# Patient Record
Sex: Female | Born: 1945
Health system: Southern US, Community
[De-identification: ages and names within clinical notes are randomized; demographics above are authoritative.]

## PROBLEM LIST (undated history)

## (undated) DIAGNOSIS — K219 Gastro-esophageal reflux disease without esophagitis: Secondary | ICD-10-CM

## (undated) DIAGNOSIS — E0590 Thyrotoxicosis, unspecified without thyrotoxic crisis or storm: Secondary | ICD-10-CM

## (undated) DIAGNOSIS — I10 Essential (primary) hypertension: Secondary | ICD-10-CM

## (undated) DIAGNOSIS — E119 Type 2 diabetes mellitus without complications: Secondary | ICD-10-CM

## (undated) DIAGNOSIS — M1990 Unspecified osteoarthritis, unspecified site: Secondary | ICD-10-CM

## (undated) DIAGNOSIS — E039 Hypothyroidism, unspecified: Secondary | ICD-10-CM

## (undated) HISTORY — DX: Thyrotoxicosis, unspecified without thyrotoxic crisis or storm: E05.90

## (undated) HISTORY — DX: Essential (primary) hypertension: I10

## (undated) HISTORY — DX: Gastro-esophageal reflux disease without esophagitis: K21.9

## (undated) HISTORY — DX: Hypothyroidism, unspecified: E03.9

## (undated) HISTORY — PX: CATARACT EXTRACTION: SUR2

## (undated) HISTORY — DX: Unspecified osteoarthritis, unspecified site: M19.90

## (undated) HISTORY — DX: Type 2 diabetes mellitus without complications: E11.9

## (undated) HISTORY — PX: ABDOMINAL HYSTERECTOMY: SHX81

---

## 2015-11-11 DIAGNOSIS — Z1211 Encounter for screening for malignant neoplasm of colon: Secondary | ICD-10-CM | POA: Diagnosis not present

## 2015-11-11 DIAGNOSIS — E7801 Familial hypercholesterolemia: Secondary | ICD-10-CM | POA: Diagnosis not present

## 2015-11-11 DIAGNOSIS — E559 Vitamin D deficiency, unspecified: Secondary | ICD-10-CM | POA: Diagnosis not present

## 2015-11-11 DIAGNOSIS — Z1231 Encounter for screening mammogram for malignant neoplasm of breast: Secondary | ICD-10-CM | POA: Diagnosis not present

## 2015-11-11 DIAGNOSIS — I10 Essential (primary) hypertension: Secondary | ICD-10-CM | POA: Diagnosis not present

## 2015-11-11 DIAGNOSIS — K219 Gastro-esophageal reflux disease without esophagitis: Secondary | ICD-10-CM | POA: Diagnosis not present

## 2015-11-11 DIAGNOSIS — G629 Polyneuropathy, unspecified: Secondary | ICD-10-CM | POA: Diagnosis not present

## 2015-11-11 DIAGNOSIS — J309 Allergic rhinitis, unspecified: Secondary | ICD-10-CM | POA: Diagnosis not present

## 2015-11-11 DIAGNOSIS — E118 Type 2 diabetes mellitus with unspecified complications: Secondary | ICD-10-CM | POA: Diagnosis not present

## 2015-11-11 DIAGNOSIS — E0590 Thyrotoxicosis, unspecified without thyrotoxic crisis or storm: Secondary | ICD-10-CM | POA: Diagnosis not present

## 2015-11-11 DIAGNOSIS — Z0000 Encounter for general adult medical examination without abnormal findings: Secondary | ICD-10-CM | POA: Diagnosis not present

## 2015-11-11 DIAGNOSIS — Z124 Encounter for screening for malignant neoplasm of cervix: Secondary | ICD-10-CM | POA: Diagnosis not present

## 2015-11-11 DIAGNOSIS — M1990 Unspecified osteoarthritis, unspecified site: Secondary | ICD-10-CM | POA: Diagnosis not present

## 2015-11-22 DIAGNOSIS — Z1231 Encounter for screening mammogram for malignant neoplasm of breast: Secondary | ICD-10-CM | POA: Diagnosis not present

## 2015-12-04 DIAGNOSIS — K219 Gastro-esophageal reflux disease without esophagitis: Secondary | ICD-10-CM | POA: Diagnosis not present

## 2016-01-20 DIAGNOSIS — H40023 Open angle with borderline findings, high risk, bilateral: Secondary | ICD-10-CM | POA: Diagnosis not present

## 2016-03-10 DIAGNOSIS — E1165 Type 2 diabetes mellitus with hyperglycemia: Secondary | ICD-10-CM | POA: Diagnosis not present

## 2016-07-13 DIAGNOSIS — E1165 Type 2 diabetes mellitus with hyperglycemia: Secondary | ICD-10-CM | POA: Diagnosis not present

## 2016-07-13 DIAGNOSIS — M7582 Other shoulder lesions, left shoulder: Secondary | ICD-10-CM | POA: Diagnosis not present

## 2016-07-20 DIAGNOSIS — M25512 Pain in left shoulder: Secondary | ICD-10-CM | POA: Diagnosis not present

## 2016-07-20 DIAGNOSIS — M7592 Shoulder lesion, unspecified, left shoulder: Secondary | ICD-10-CM | POA: Diagnosis not present

## 2016-07-22 DIAGNOSIS — M25512 Pain in left shoulder: Secondary | ICD-10-CM | POA: Diagnosis not present

## 2016-07-22 DIAGNOSIS — M7592 Shoulder lesion, unspecified, left shoulder: Secondary | ICD-10-CM | POA: Diagnosis not present

## 2016-07-24 DIAGNOSIS — H40013 Open angle with borderline findings, low risk, bilateral: Secondary | ICD-10-CM | POA: Diagnosis not present

## 2016-07-24 DIAGNOSIS — E119 Type 2 diabetes mellitus without complications: Secondary | ICD-10-CM | POA: Diagnosis not present

## 2016-07-24 DIAGNOSIS — H52223 Regular astigmatism, bilateral: Secondary | ICD-10-CM | POA: Diagnosis not present

## 2016-07-24 DIAGNOSIS — H524 Presbyopia: Secondary | ICD-10-CM | POA: Diagnosis not present

## 2016-07-24 DIAGNOSIS — H5203 Hypermetropia, bilateral: Secondary | ICD-10-CM | POA: Diagnosis not present

## 2016-07-27 DIAGNOSIS — M7592 Shoulder lesion, unspecified, left shoulder: Secondary | ICD-10-CM | POA: Diagnosis not present

## 2016-07-27 DIAGNOSIS — M25512 Pain in left shoulder: Secondary | ICD-10-CM | POA: Diagnosis not present

## 2016-07-29 DIAGNOSIS — M7592 Shoulder lesion, unspecified, left shoulder: Secondary | ICD-10-CM | POA: Diagnosis not present

## 2016-07-29 DIAGNOSIS — M25512 Pain in left shoulder: Secondary | ICD-10-CM | POA: Diagnosis not present

## 2016-08-03 DIAGNOSIS — M25512 Pain in left shoulder: Secondary | ICD-10-CM | POA: Diagnosis not present

## 2016-08-03 DIAGNOSIS — M7592 Shoulder lesion, unspecified, left shoulder: Secondary | ICD-10-CM | POA: Diagnosis not present

## 2016-08-05 DIAGNOSIS — M25512 Pain in left shoulder: Secondary | ICD-10-CM | POA: Diagnosis not present

## 2016-08-05 DIAGNOSIS — M7592 Shoulder lesion, unspecified, left shoulder: Secondary | ICD-10-CM | POA: Diagnosis not present

## 2016-08-25 DIAGNOSIS — R3989 Other symptoms and signs involving the genitourinary system: Secondary | ICD-10-CM | POA: Diagnosis not present

## 2016-11-11 DIAGNOSIS — Z124 Encounter for screening for malignant neoplasm of cervix: Secondary | ICD-10-CM | POA: Diagnosis not present

## 2016-11-11 DIAGNOSIS — Z0000 Encounter for general adult medical examination without abnormal findings: Secondary | ICD-10-CM | POA: Diagnosis not present

## 2016-11-11 DIAGNOSIS — Z1231 Encounter for screening mammogram for malignant neoplasm of breast: Secondary | ICD-10-CM | POA: Diagnosis not present

## 2016-11-11 DIAGNOSIS — E7801 Familial hypercholesterolemia: Secondary | ICD-10-CM | POA: Diagnosis not present

## 2016-11-11 DIAGNOSIS — M1990 Unspecified osteoarthritis, unspecified site: Secondary | ICD-10-CM | POA: Diagnosis not present

## 2016-11-11 DIAGNOSIS — E0590 Thyrotoxicosis, unspecified without thyrotoxic crisis or storm: Secondary | ICD-10-CM | POA: Diagnosis not present

## 2016-11-11 DIAGNOSIS — Z6826 Body mass index (BMI) 26.0-26.9, adult: Secondary | ICD-10-CM | POA: Diagnosis not present

## 2016-11-11 DIAGNOSIS — E559 Vitamin D deficiency, unspecified: Secondary | ICD-10-CM | POA: Diagnosis not present

## 2016-11-11 DIAGNOSIS — E1165 Type 2 diabetes mellitus with hyperglycemia: Secondary | ICD-10-CM | POA: Diagnosis not present

## 2016-11-11 DIAGNOSIS — J309 Allergic rhinitis, unspecified: Secondary | ICD-10-CM | POA: Diagnosis not present

## 2016-11-11 DIAGNOSIS — I10 Essential (primary) hypertension: Secondary | ICD-10-CM | POA: Diagnosis not present

## 2016-11-11 DIAGNOSIS — Z1211 Encounter for screening for malignant neoplasm of colon: Secondary | ICD-10-CM | POA: Diagnosis not present

## 2016-11-23 DIAGNOSIS — Z1231 Encounter for screening mammogram for malignant neoplasm of breast: Secondary | ICD-10-CM | POA: Diagnosis not present

## 2016-11-24 DIAGNOSIS — M75100 Unspecified rotator cuff tear or rupture of unspecified shoulder, not specified as traumatic: Secondary | ICD-10-CM | POA: Diagnosis not present

## 2016-11-25 DIAGNOSIS — M79671 Pain in right foot: Secondary | ICD-10-CM | POA: Diagnosis not present

## 2016-11-25 DIAGNOSIS — M79672 Pain in left foot: Secondary | ICD-10-CM | POA: Diagnosis not present

## 2016-11-25 DIAGNOSIS — E1140 Type 2 diabetes mellitus with diabetic neuropathy, unspecified: Secondary | ICD-10-CM | POA: Diagnosis not present

## 2016-12-14 DIAGNOSIS — D649 Anemia, unspecified: Secondary | ICD-10-CM | POA: Diagnosis not present

## 2016-12-25 DIAGNOSIS — M79671 Pain in right foot: Secondary | ICD-10-CM | POA: Diagnosis not present

## 2016-12-25 DIAGNOSIS — E1140 Type 2 diabetes mellitus with diabetic neuropathy, unspecified: Secondary | ICD-10-CM | POA: Diagnosis not present

## 2016-12-25 DIAGNOSIS — M79672 Pain in left foot: Secondary | ICD-10-CM | POA: Diagnosis not present

## 2017-01-20 DIAGNOSIS — M79671 Pain in right foot: Secondary | ICD-10-CM | POA: Diagnosis not present

## 2017-01-20 DIAGNOSIS — E1140 Type 2 diabetes mellitus with diabetic neuropathy, unspecified: Secondary | ICD-10-CM | POA: Diagnosis not present

## 2017-01-20 DIAGNOSIS — M79672 Pain in left foot: Secondary | ICD-10-CM | POA: Diagnosis not present

## 2017-02-17 DIAGNOSIS — M79672 Pain in left foot: Secondary | ICD-10-CM | POA: Diagnosis not present

## 2017-02-17 DIAGNOSIS — M79671 Pain in right foot: Secondary | ICD-10-CM | POA: Diagnosis not present

## 2017-02-17 DIAGNOSIS — E1140 Type 2 diabetes mellitus with diabetic neuropathy, unspecified: Secondary | ICD-10-CM | POA: Diagnosis not present

## 2017-03-17 DIAGNOSIS — G600 Hereditary motor and sensory neuropathy: Secondary | ICD-10-CM | POA: Diagnosis not present

## 2017-03-17 DIAGNOSIS — E1140 Type 2 diabetes mellitus with diabetic neuropathy, unspecified: Secondary | ICD-10-CM | POA: Diagnosis not present

## 2017-03-17 DIAGNOSIS — G5790 Unspecified mononeuropathy of unspecified lower limb: Secondary | ICD-10-CM | POA: Diagnosis not present

## 2017-03-17 DIAGNOSIS — M79671 Pain in right foot: Secondary | ICD-10-CM | POA: Diagnosis not present

## 2017-04-26 DIAGNOSIS — R5383 Other fatigue: Secondary | ICD-10-CM | POA: Diagnosis not present

## 2017-04-26 DIAGNOSIS — Z299 Encounter for prophylactic measures, unspecified: Secondary | ICD-10-CM | POA: Diagnosis not present

## 2017-04-26 DIAGNOSIS — E0590 Thyrotoxicosis, unspecified without thyrotoxic crisis or storm: Secondary | ICD-10-CM | POA: Diagnosis not present

## 2017-04-26 DIAGNOSIS — Z6825 Body mass index (BMI) 25.0-25.9, adult: Secondary | ICD-10-CM | POA: Diagnosis not present

## 2017-04-26 DIAGNOSIS — I10 Essential (primary) hypertension: Secondary | ICD-10-CM | POA: Diagnosis not present

## 2017-04-26 DIAGNOSIS — K219 Gastro-esophageal reflux disease without esophagitis: Secondary | ICD-10-CM | POA: Diagnosis not present

## 2017-04-26 DIAGNOSIS — M1990 Unspecified osteoarthritis, unspecified site: Secondary | ICD-10-CM | POA: Diagnosis not present

## 2017-04-26 DIAGNOSIS — Z713 Dietary counseling and surveillance: Secondary | ICD-10-CM | POA: Diagnosis not present

## 2017-04-26 DIAGNOSIS — D649 Anemia, unspecified: Secondary | ICD-10-CM | POA: Diagnosis not present

## 2017-04-26 DIAGNOSIS — E119 Type 2 diabetes mellitus without complications: Secondary | ICD-10-CM | POA: Diagnosis not present

## 2017-05-19 DIAGNOSIS — I10 Essential (primary) hypertension: Secondary | ICD-10-CM | POA: Diagnosis not present

## 2017-05-19 DIAGNOSIS — Z6824 Body mass index (BMI) 24.0-24.9, adult: Secondary | ICD-10-CM | POA: Diagnosis not present

## 2017-05-19 DIAGNOSIS — Z299 Encounter for prophylactic measures, unspecified: Secondary | ICD-10-CM | POA: Diagnosis not present

## 2017-05-19 DIAGNOSIS — E119 Type 2 diabetes mellitus without complications: Secondary | ICD-10-CM | POA: Diagnosis not present

## 2017-05-19 DIAGNOSIS — E0590 Thyrotoxicosis, unspecified without thyrotoxic crisis or storm: Secondary | ICD-10-CM | POA: Diagnosis not present

## 2017-05-19 DIAGNOSIS — B029 Zoster without complications: Secondary | ICD-10-CM | POA: Diagnosis not present

## 2017-07-30 DIAGNOSIS — H524 Presbyopia: Secondary | ICD-10-CM | POA: Diagnosis not present

## 2017-07-30 DIAGNOSIS — E119 Type 2 diabetes mellitus without complications: Secondary | ICD-10-CM | POA: Diagnosis not present

## 2017-07-30 DIAGNOSIS — H5203 Hypermetropia, bilateral: Secondary | ICD-10-CM | POA: Diagnosis not present

## 2017-07-30 DIAGNOSIS — H40013 Open angle with borderline findings, low risk, bilateral: Secondary | ICD-10-CM | POA: Diagnosis not present

## 2017-07-30 DIAGNOSIS — H52223 Regular astigmatism, bilateral: Secondary | ICD-10-CM | POA: Diagnosis not present

## 2017-08-04 DIAGNOSIS — Z789 Other specified health status: Secondary | ICD-10-CM | POA: Diagnosis not present

## 2017-08-04 DIAGNOSIS — Z299 Encounter for prophylactic measures, unspecified: Secondary | ICD-10-CM | POA: Diagnosis not present

## 2017-08-04 DIAGNOSIS — E1165 Type 2 diabetes mellitus with hyperglycemia: Secondary | ICD-10-CM | POA: Diagnosis not present

## 2017-08-04 DIAGNOSIS — Z6824 Body mass index (BMI) 24.0-24.9, adult: Secondary | ICD-10-CM | POA: Diagnosis not present

## 2017-08-04 DIAGNOSIS — J309 Allergic rhinitis, unspecified: Secondary | ICD-10-CM | POA: Diagnosis not present

## 2017-08-04 DIAGNOSIS — I10 Essential (primary) hypertension: Secondary | ICD-10-CM | POA: Diagnosis not present

## 2017-08-04 DIAGNOSIS — M549 Dorsalgia, unspecified: Secondary | ICD-10-CM | POA: Diagnosis not present

## 2017-09-17 DIAGNOSIS — H40013 Open angle with borderline findings, low risk, bilateral: Secondary | ICD-10-CM | POA: Diagnosis not present

## 2017-11-10 DIAGNOSIS — R5383 Other fatigue: Secondary | ICD-10-CM | POA: Diagnosis not present

## 2017-11-10 DIAGNOSIS — Z1331 Encounter for screening for depression: Secondary | ICD-10-CM | POA: Diagnosis not present

## 2017-11-10 DIAGNOSIS — E559 Vitamin D deficiency, unspecified: Secondary | ICD-10-CM | POA: Diagnosis not present

## 2017-11-10 DIAGNOSIS — Z6826 Body mass index (BMI) 26.0-26.9, adult: Secondary | ICD-10-CM | POA: Diagnosis not present

## 2017-11-10 DIAGNOSIS — Z7189 Other specified counseling: Secondary | ICD-10-CM | POA: Diagnosis not present

## 2017-11-10 DIAGNOSIS — I10 Essential (primary) hypertension: Secondary | ICD-10-CM | POA: Diagnosis not present

## 2017-11-10 DIAGNOSIS — Z79899 Other long term (current) drug therapy: Secondary | ICD-10-CM | POA: Diagnosis not present

## 2017-11-10 DIAGNOSIS — Z1339 Encounter for screening examination for other mental health and behavioral disorders: Secondary | ICD-10-CM | POA: Diagnosis not present

## 2017-11-10 DIAGNOSIS — E538 Deficiency of other specified B group vitamins: Secondary | ICD-10-CM | POA: Diagnosis not present

## 2017-11-10 DIAGNOSIS — Z90710 Acquired absence of both cervix and uterus: Secondary | ICD-10-CM | POA: Diagnosis not present

## 2017-11-10 DIAGNOSIS — E8940 Asymptomatic postprocedural ovarian failure: Secondary | ICD-10-CM | POA: Diagnosis not present

## 2017-11-10 DIAGNOSIS — Z0000 Encounter for general adult medical examination without abnormal findings: Secondary | ICD-10-CM | POA: Diagnosis not present

## 2017-11-10 DIAGNOSIS — Z299 Encounter for prophylactic measures, unspecified: Secondary | ICD-10-CM | POA: Diagnosis not present

## 2017-11-10 DIAGNOSIS — E1165 Type 2 diabetes mellitus with hyperglycemia: Secondary | ICD-10-CM | POA: Diagnosis not present

## 2017-11-10 DIAGNOSIS — D509 Iron deficiency anemia, unspecified: Secondary | ICD-10-CM | POA: Diagnosis not present

## 2017-11-23 DIAGNOSIS — E2839 Other primary ovarian failure: Secondary | ICD-10-CM | POA: Diagnosis not present

## 2017-11-26 DIAGNOSIS — Z1231 Encounter for screening mammogram for malignant neoplasm of breast: Secondary | ICD-10-CM | POA: Diagnosis not present

## 2017-12-30 DIAGNOSIS — K5730 Diverticulosis of large intestine without perforation or abscess without bleeding: Secondary | ICD-10-CM | POA: Diagnosis not present

## 2017-12-30 DIAGNOSIS — E0590 Thyrotoxicosis, unspecified without thyrotoxic crisis or storm: Secondary | ICD-10-CM | POA: Diagnosis not present

## 2017-12-30 DIAGNOSIS — K644 Residual hemorrhoidal skin tags: Secondary | ICD-10-CM | POA: Diagnosis not present

## 2017-12-30 DIAGNOSIS — Z86010 Personal history of colonic polyps: Secondary | ICD-10-CM | POA: Diagnosis not present

## 2017-12-30 DIAGNOSIS — K219 Gastro-esophageal reflux disease without esophagitis: Secondary | ICD-10-CM | POA: Diagnosis not present

## 2017-12-30 DIAGNOSIS — K598 Other specified functional intestinal disorders: Secondary | ICD-10-CM | POA: Diagnosis not present

## 2017-12-30 DIAGNOSIS — Z1211 Encounter for screening for malignant neoplasm of colon: Secondary | ICD-10-CM | POA: Diagnosis not present

## 2017-12-30 DIAGNOSIS — Q438 Other specified congenital malformations of intestine: Secondary | ICD-10-CM | POA: Diagnosis not present

## 2017-12-30 DIAGNOSIS — I10 Essential (primary) hypertension: Secondary | ICD-10-CM | POA: Diagnosis not present

## 2017-12-30 DIAGNOSIS — K641 Second degree hemorrhoids: Secondary | ICD-10-CM | POA: Diagnosis not present

## 2017-12-30 DIAGNOSIS — E119 Type 2 diabetes mellitus without complications: Secondary | ICD-10-CM | POA: Diagnosis not present

## 2017-12-30 DIAGNOSIS — M1990 Unspecified osteoarthritis, unspecified site: Secondary | ICD-10-CM | POA: Diagnosis not present

## 2018-02-15 DIAGNOSIS — Z6826 Body mass index (BMI) 26.0-26.9, adult: Secondary | ICD-10-CM | POA: Diagnosis not present

## 2018-02-15 DIAGNOSIS — I10 Essential (primary) hypertension: Secondary | ICD-10-CM | POA: Diagnosis not present

## 2018-02-15 DIAGNOSIS — E1165 Type 2 diabetes mellitus with hyperglycemia: Secondary | ICD-10-CM | POA: Diagnosis not present

## 2018-02-15 DIAGNOSIS — B370 Candidal stomatitis: Secondary | ICD-10-CM | POA: Diagnosis not present

## 2018-02-15 DIAGNOSIS — Z299 Encounter for prophylactic measures, unspecified: Secondary | ICD-10-CM | POA: Diagnosis not present

## 2018-03-18 DIAGNOSIS — H40013 Open angle with borderline findings, low risk, bilateral: Secondary | ICD-10-CM | POA: Diagnosis not present

## 2018-05-31 DIAGNOSIS — I10 Essential (primary) hypertension: Secondary | ICD-10-CM | POA: Diagnosis not present

## 2018-05-31 DIAGNOSIS — E1165 Type 2 diabetes mellitus with hyperglycemia: Secondary | ICD-10-CM | POA: Diagnosis not present

## 2018-05-31 DIAGNOSIS — K219 Gastro-esophageal reflux disease without esophagitis: Secondary | ICD-10-CM | POA: Diagnosis not present

## 2018-05-31 DIAGNOSIS — M791 Myalgia, unspecified site: Secondary | ICD-10-CM | POA: Diagnosis not present

## 2018-05-31 DIAGNOSIS — B370 Candidal stomatitis: Secondary | ICD-10-CM | POA: Diagnosis not present

## 2018-05-31 DIAGNOSIS — Z789 Other specified health status: Secondary | ICD-10-CM | POA: Diagnosis not present

## 2018-05-31 DIAGNOSIS — E0590 Thyrotoxicosis, unspecified without thyrotoxic crisis or storm: Secondary | ICD-10-CM | POA: Diagnosis not present

## 2018-05-31 DIAGNOSIS — Z299 Encounter for prophylactic measures, unspecified: Secondary | ICD-10-CM | POA: Diagnosis not present

## 2018-05-31 DIAGNOSIS — Z6825 Body mass index (BMI) 25.0-25.9, adult: Secondary | ICD-10-CM | POA: Diagnosis not present

## 2018-06-02 DIAGNOSIS — E1140 Type 2 diabetes mellitus with diabetic neuropathy, unspecified: Secondary | ICD-10-CM | POA: Diagnosis not present

## 2018-06-02 DIAGNOSIS — E1151 Type 2 diabetes mellitus with diabetic peripheral angiopathy without gangrene: Secondary | ICD-10-CM | POA: Diagnosis not present

## 2018-06-02 DIAGNOSIS — M79671 Pain in right foot: Secondary | ICD-10-CM | POA: Diagnosis not present

## 2018-06-02 DIAGNOSIS — G600 Hereditary motor and sensory neuropathy: Secondary | ICD-10-CM | POA: Diagnosis not present

## 2018-06-13 DIAGNOSIS — Z299 Encounter for prophylactic measures, unspecified: Secondary | ICD-10-CM | POA: Diagnosis not present

## 2018-06-13 DIAGNOSIS — I10 Essential (primary) hypertension: Secondary | ICD-10-CM | POA: Diagnosis not present

## 2018-06-13 DIAGNOSIS — G629 Polyneuropathy, unspecified: Secondary | ICD-10-CM | POA: Diagnosis not present

## 2018-06-13 DIAGNOSIS — E0590 Thyrotoxicosis, unspecified without thyrotoxic crisis or storm: Secondary | ICD-10-CM | POA: Diagnosis not present

## 2018-06-13 DIAGNOSIS — Z6825 Body mass index (BMI) 25.0-25.9, adult: Secondary | ICD-10-CM | POA: Diagnosis not present

## 2018-06-27 ENCOUNTER — Encounter: Payer: Self-pay | Admitting: "Endocrinology

## 2018-06-27 DIAGNOSIS — E0590 Thyrotoxicosis, unspecified without thyrotoxic crisis or storm: Secondary | ICD-10-CM | POA: Diagnosis not present

## 2018-06-27 DIAGNOSIS — E042 Nontoxic multinodular goiter: Secondary | ICD-10-CM | POA: Diagnosis not present

## 2018-06-30 DIAGNOSIS — M79674 Pain in right toe(s): Secondary | ICD-10-CM | POA: Diagnosis not present

## 2018-06-30 DIAGNOSIS — E1140 Type 2 diabetes mellitus with diabetic neuropathy, unspecified: Secondary | ICD-10-CM | POA: Diagnosis not present

## 2018-06-30 DIAGNOSIS — M79671 Pain in right foot: Secondary | ICD-10-CM | POA: Diagnosis not present

## 2018-06-30 DIAGNOSIS — M79672 Pain in left foot: Secondary | ICD-10-CM | POA: Diagnosis not present

## 2018-07-14 DIAGNOSIS — Z2821 Immunization not carried out because of patient refusal: Secondary | ICD-10-CM | POA: Diagnosis not present

## 2018-07-14 DIAGNOSIS — E0590 Thyrotoxicosis, unspecified without thyrotoxic crisis or storm: Secondary | ICD-10-CM | POA: Diagnosis not present

## 2018-07-14 DIAGNOSIS — Z6826 Body mass index (BMI) 26.0-26.9, adult: Secondary | ICD-10-CM | POA: Diagnosis not present

## 2018-07-14 DIAGNOSIS — E1165 Type 2 diabetes mellitus with hyperglycemia: Secondary | ICD-10-CM | POA: Diagnosis not present

## 2018-07-14 DIAGNOSIS — I10 Essential (primary) hypertension: Secondary | ICD-10-CM | POA: Diagnosis not present

## 2018-07-14 DIAGNOSIS — Z299 Encounter for prophylactic measures, unspecified: Secondary | ICD-10-CM | POA: Diagnosis not present

## 2018-07-28 DIAGNOSIS — M25579 Pain in unspecified ankle and joints of unspecified foot: Secondary | ICD-10-CM | POA: Diagnosis not present

## 2018-07-28 DIAGNOSIS — M79672 Pain in left foot: Secondary | ICD-10-CM | POA: Diagnosis not present

## 2018-07-28 DIAGNOSIS — M79674 Pain in right toe(s): Secondary | ICD-10-CM | POA: Diagnosis not present

## 2018-07-28 DIAGNOSIS — M79671 Pain in right foot: Secondary | ICD-10-CM | POA: Diagnosis not present

## 2018-08-04 ENCOUNTER — Encounter: Payer: Self-pay | Admitting: "Endocrinology

## 2018-08-22 ENCOUNTER — Ambulatory Visit: Payer: Self-pay | Admitting: "Endocrinology

## 2018-08-24 DIAGNOSIS — H40013 Open angle with borderline findings, low risk, bilateral: Secondary | ICD-10-CM | POA: Diagnosis not present

## 2018-08-24 DIAGNOSIS — H524 Presbyopia: Secondary | ICD-10-CM | POA: Diagnosis not present

## 2018-08-24 DIAGNOSIS — H5203 Hypermetropia, bilateral: Secondary | ICD-10-CM | POA: Diagnosis not present

## 2018-08-24 DIAGNOSIS — E119 Type 2 diabetes mellitus without complications: Secondary | ICD-10-CM | POA: Diagnosis not present

## 2018-09-14 DIAGNOSIS — L509 Urticaria, unspecified: Secondary | ICD-10-CM | POA: Diagnosis not present

## 2018-09-14 DIAGNOSIS — M79605 Pain in left leg: Secondary | ICD-10-CM | POA: Diagnosis not present

## 2018-09-14 DIAGNOSIS — Z2821 Immunization not carried out because of patient refusal: Secondary | ICD-10-CM | POA: Diagnosis not present

## 2018-09-14 DIAGNOSIS — Z299 Encounter for prophylactic measures, unspecified: Secondary | ICD-10-CM | POA: Diagnosis not present

## 2018-09-14 DIAGNOSIS — Z6826 Body mass index (BMI) 26.0-26.9, adult: Secondary | ICD-10-CM | POA: Diagnosis not present

## 2018-09-14 DIAGNOSIS — E0590 Thyrotoxicosis, unspecified without thyrotoxic crisis or storm: Secondary | ICD-10-CM | POA: Diagnosis not present

## 2018-09-14 DIAGNOSIS — M79604 Pain in right leg: Secondary | ICD-10-CM | POA: Diagnosis not present

## 2018-09-14 DIAGNOSIS — E1165 Type 2 diabetes mellitus with hyperglycemia: Secondary | ICD-10-CM | POA: Diagnosis not present

## 2018-09-14 DIAGNOSIS — I10 Essential (primary) hypertension: Secondary | ICD-10-CM | POA: Diagnosis not present

## 2018-09-19 DIAGNOSIS — M25562 Pain in left knee: Secondary | ICD-10-CM | POA: Diagnosis not present

## 2018-09-19 DIAGNOSIS — M47816 Spondylosis without myelopathy or radiculopathy, lumbar region: Secondary | ICD-10-CM | POA: Diagnosis not present

## 2018-09-19 DIAGNOSIS — M25561 Pain in right knee: Secondary | ICD-10-CM | POA: Diagnosis not present

## 2018-09-19 DIAGNOSIS — M25552 Pain in left hip: Secondary | ICD-10-CM | POA: Diagnosis not present

## 2018-09-19 DIAGNOSIS — M25551 Pain in right hip: Secondary | ICD-10-CM | POA: Diagnosis not present

## 2018-09-19 DIAGNOSIS — M545 Low back pain: Secondary | ICD-10-CM | POA: Diagnosis not present

## 2018-09-24 DIAGNOSIS — H40013 Open angle with borderline findings, low risk, bilateral: Secondary | ICD-10-CM | POA: Diagnosis not present

## 2018-09-26 ENCOUNTER — Encounter: Payer: Self-pay | Admitting: "Endocrinology

## 2018-09-26 ENCOUNTER — Ambulatory Visit (INDEPENDENT_AMBULATORY_CARE_PROVIDER_SITE_OTHER): Payer: Medicare Other | Admitting: "Endocrinology

## 2018-09-26 VITALS — BP 131/74 | HR 94 | Ht 63.0 in | Wt 144.0 lb

## 2018-09-26 DIAGNOSIS — E0520 Thyrotoxicosis with toxic multinodular goiter without thyrotoxic crisis or storm: Secondary | ICD-10-CM | POA: Diagnosis not present

## 2018-09-26 NOTE — Progress Notes (Signed)
Endocrinology Consult Note                                            09/26/2018, 1:59 PM   Subjective:    Patient ID: Andrea Shannon, female    DOB: [birth year], PCP Monico Blitz, MD   Past Medical History:  Diagnosis Date  . Diabetes mellitus, type II ([place])   . Hyperthyroidism    Past Surgical History:  Procedure Laterality Date  . ABDOMINAL HYSTERECTOMY     Social History   Socioeconomic History  . Marital status: Widowed    Spouse name: Not on file  . Number of children: Not on file  . Years of education: Not on file  . Highest education level: Not on file  Occupational History  . Not on file  Social Needs  . Financial resource strain: Not on file  . Food insecurity:    Worry: Not on file    Inability: Not on file  . Transportation needs:    Medical: Not on file    Non-medical: Not on file  Tobacco Use  . Smoking status: Never Smoker  . Smokeless tobacco: Never Used  Substance and Sexual Activity  . Alcohol use: Not Currently    Frequency: Never  . Drug use: Never  . Sexual activity: Not on file  Lifestyle  . Physical activity:    Days per week: Not on file    Minutes per session: Not on file  . Stress: Not on file  Relationships  . Social connections:    Talks on phone: Not on file    Gets together: Not on file    Attends religious service: Not on file    Active member of club or organization: Not on file    Attends meetings of clubs or organizations: Not on file    Relationship status: Not on file  Other Topics Concern  . Not on file  Social History Narrative  . Not on file   Outpatient Encounter Medications as of 09/26/2018  Medication Sig  . aspirin 81 MG chewable tablet Chew by mouth daily.  . diclofenac (VOLTAREN) 75 MG EC tablet Take 75 mg by mouth daily.  Marland Kitchen lisinopril-hydrochlorothiazide (PRINZIDE,ZESTORETIC) 20-12.5 MG tablet Take 1 tablet by mouth daily.  . metFORMIN (GLUCOPHAGE) 500 MG tablet Take by mouth 2 (two) times daily  with a meal.  . omeprazole (PRILOSEC) 20 MG capsule Take 20 mg by mouth daily.  . [DISCONTINUED] lisinopril (PRINIVIL,ZESTRIL) 20 MG tablet Take 20 mg by mouth daily.  . methimazole (TAPAZOLE) 10 MG tablet Take 10 mg by mouth daily.   No facility-administered encounter medications on file as of 09/26/2018.    ALLERGIES: Not on File  VACCINATION STATUS:  There is no immunization history on file for this patient.  HPI Andrea Shannon is 72 y.o. female who presents today with a medical history as above. she is being seen in consultation for toxic multinodular goiter requested by Monico Blitz, MD.  she has been dealing with symptoms of hyperthyroidism which required initiation of antithyroid treatment with methimazole.  She reports that she has taken this medication for the last 3 years with clinical improvement.  She was found to have multinodular goiter on thyroid ultrasound in September 2019.  She denies dysphagia, shortness of breath, nor voice change.  -She denies recent weight change, palpitations,  heat intolerance, tremors.  She reports family history of thyroid dysfunction, family history of thyroid malignancy.   Review of Systems  Constitutional: no weight gain/loss, no fatigue, no subjective hyperthermia, no subjective hypothermia Eyes: no blurry vision, no xerophthalmia ENT: no sore throat, no nodules palpated in throat, no dysphagia/odynophagia, no hoarseness Cardiovascular: no Chest Pain, no Shortness of Breath, no palpitations, no leg swelling Respiratory: no cough, no SOB Gastrointestinal: no Nausea/Vomiting/Diarhhea Musculoskeletal: no muscle/joint aches Skin: no rashes Neurological: no tremors, no numbness, no tingling, no dizziness Psychiatric: no depression, no anxiety  Objective:    BP 131/74   Pulse 94   Ht 5\' 3"  (1.6 m)   Wt 144 lb (65.3 kg)   BMI 25.51 kg/m   Wt Readings from Last 3 Encounters:  09/26/18 144 lb (65.3 kg)    Physical  Exam  Constitutional: + Appropriate weight for height, not in acute distress, normal state of mind Eyes: PERRLA, EOMI, no exophthalmos ENT: moist mucous membranes, + palpable thyromegaly, no cervical lymphadenopathy Cardiovascular: normal precordial activity, Regular Rate and Rhythm, no Murmur/Rubs/Gallops Respiratory:  adequate breathing efforts, no gross chest deformity, Clear to auscultation bilaterally Gastrointestinal: abdomen soft, Non -tender, No distension, Bowel Sounds present Musculoskeletal: no gross deformities, strength intact in all four extremities Skin: moist, warm, no rashes Neurological: no tremor with outstretched hands, Deep tendon reflexes normal in all four extremities.   Ultrasound of the thyroid on June 27, 2018 showed right lobe measuring 4.9 x 1.9 x 2.1 cm with 1 dominant mixed cystic and solid nodule measuring 2.2 x 1.5 x 1.8 cm.  Another nodule in the superior right lobe measuring 1.9 x 0.9 x 1.2 cm. Left lobe heterogeneous measuring 4.7 x 3.2 x 2.3 cm with dominant mixed cystic and solid nodule in the central left lobe measuring 2.6 x 2.4 x 2.3 cm.  Separate mixed cystic and solid nodule in the superior left lobe measuring 1.7 x 1.3 x 1.4 cm. Impression:  innumerable thyroid nodules, increasing probability of benignity.  2 suspicious right thyroid nodule measuring 1.9 x 0.9 x 1.2 cm in the superior right lobe.  Ultrasound-guided FNA is suggested.  More suspicious left thyroid nodule in the superior left lobe measuring 2.6 x 2.4 x 2.3 cm.  Ultrasound-guided FNA suggested.    Assessment & Plan:   1. Toxic multinodul goiter - Andrea Shannon  is being seen at a kind request of Monico Blitz, MD. - I have reviewed her available thyroid records and clinically evaluated the patient. - Based on reviews, she has toxic multinodular goiter for which she took methimazole for 3 years or more.  She does not have recent thyroid function tests to review. -I discussed and  lowered her methimazole to 10 mg daily.  She will be sent to lab for a new set of thyroid function tests.  If she continues to have hyperthyroidism, she will be considered for alternative thyroid ablation with I-131 if her fine-needle aspiration of her thyroid lobes are benign.  See below.  2.  Multinodular goiter -2 nodules, one on each lobe of her thyroid meet consensus criteria for fine-needle aspiration.  She is approached for same. -This study will be scheduled in [facility] radiology for her.  If she has suspicious FNA results, she will be approached for thyroidectomy.  Will make I-131 therapy unnecessary, however will require subsequent thyroid hormone replacement.  She will return in 4 weeks with her biopsy results as well as her new thyroid function test results.  -  I advised her  to maintain close follow up with Monico Blitz, MD for primary care needs.   - Time spent with the patient: 45 minutes, of which >50% was spent in obtaining information about her symptoms, reviewing her previous labs, evaluations, and treatments, counseling her about her toxic multinodular goiter, and developing a plan to confirm the diagnosis and long term treatment as necessary.  Andrea Shannon participated in the discussions, expressed understanding, and voiced agreement with the above plans.  All questions were answered to her satisfaction. she is encouraged to contact clinic should she have any questions or concerns prior to her return visit.  Follow up plan: Return in about 4 weeks (around 10/24/2018) for Labs Today- Non-Fasting Ok, Follow up with Biopsy Results.   Glade Lloyd, MD [facility] [address] [place], [place] [address] Phone: [phone]  Fax: [phone]     09/26/2018, 1:59 PM  This note was partially dictated with voice recognition software. Similar sounding words can be transcribed inadequately or may not  be corrected  upon review.

## 2018-10-13 DIAGNOSIS — E0590 Thyrotoxicosis, unspecified without thyrotoxic crisis or storm: Secondary | ICD-10-CM | POA: Diagnosis not present

## 2018-10-13 DIAGNOSIS — Z6825 Body mass index (BMI) 25.0-25.9, adult: Secondary | ICD-10-CM | POA: Diagnosis not present

## 2018-10-13 DIAGNOSIS — Z299 Encounter for prophylactic measures, unspecified: Secondary | ICD-10-CM | POA: Diagnosis not present

## 2018-10-13 DIAGNOSIS — E1165 Type 2 diabetes mellitus with hyperglycemia: Secondary | ICD-10-CM | POA: Diagnosis not present

## 2018-10-13 DIAGNOSIS — I10 Essential (primary) hypertension: Secondary | ICD-10-CM | POA: Diagnosis not present

## 2018-10-19 LAB — TSH: TSH: 0.48 (ref 0.41–5.90)

## 2018-10-25 ENCOUNTER — Ambulatory Visit: Payer: Medicare Other | Admitting: "Endocrinology

## 2018-10-27 ENCOUNTER — Telehealth: Payer: Self-pay

## 2018-10-27 DIAGNOSIS — E0520 Thyrotoxicosis with toxic multinodular goiter without thyrotoxic crisis or storm: Secondary | ICD-10-CM

## 2018-10-27 NOTE — Telephone Encounter (Signed)
LAB ORDER PLACED

## 2018-10-28 DIAGNOSIS — E0520 Thyrotoxicosis with toxic multinodular goiter without thyrotoxic crisis or storm: Secondary | ICD-10-CM | POA: Diagnosis not present

## 2018-11-08 ENCOUNTER — Encounter: Payer: Self-pay | Admitting: "Endocrinology

## 2018-11-08 ENCOUNTER — Ambulatory Visit (INDEPENDENT_AMBULATORY_CARE_PROVIDER_SITE_OTHER): Payer: Medicare Other | Admitting: "Endocrinology

## 2018-11-08 VITALS — BP 128/74 | HR 103 | Ht 63.0 in | Wt 145.0 lb

## 2018-11-08 DIAGNOSIS — E0520 Thyrotoxicosis with toxic multinodular goiter without thyrotoxic crisis or storm: Secondary | ICD-10-CM

## 2018-11-08 NOTE — Progress Notes (Signed)
Endocrinology follow-up Note                                            11/08/2018, 1:43 PM   Subjective:    Patient ID: Andrea Shannon, female    DOB: Oct 17, 1946, PCP Monico Blitz, MD   Past Medical History:  Diagnosis Date  . Diabetes mellitus, type II ([place])   . Hyperthyroidism    Past Surgical History:  Procedure Laterality Date  . ABDOMINAL HYSTERECTOMY     Social History   Socioeconomic History  . Marital status: Widowed    Spouse name: Not on file  . Number of children: Not on file  . Years of education: Not on file  . Highest education level: Not on file  Occupational History  . Not on file  Social Needs  . Financial resource strain: Not on file  . Food insecurity:    Worry: Not on file    Inability: Not on file  . Transportation needs:    Medical: Not on file    Non-medical: Not on file  Tobacco Use  . Smoking status: Never Smoker  . Smokeless tobacco: Never Used  Substance and Sexual Activity  . Alcohol use: Not Currently    Frequency: Never  . Drug use: Never  . Sexual activity: Not on file  Lifestyle  . Physical activity:    Days per week: Not on file    Minutes per session: Not on file  . Stress: Not on file  Relationships  . Social connections:    Talks on phone: Not on file    Gets together: Not on file    Attends religious service: Not on file    Active member of club or organization: Not on file    Attends meetings of clubs or organizations: Not on file    Relationship status: Not on file  Other Topics Concern  . Not on file  Social History Narrative  . Not on file   Outpatient Encounter Medications as of 11/08/2018  Medication Sig  . aspirin 81 MG chewable tablet Chew by mouth daily.  . diclofenac (VOLTAREN) 75 MG EC tablet Take 75 mg by mouth daily.  Marland Kitchen lisinopril-hydrochlorothiazide (PRINZIDE,ZESTORETIC) 20-12.5 MG tablet Take 1 tablet by mouth daily.  . metFORMIN (GLUCOPHAGE) 500 MG tablet Take by mouth 2 (two) times  daily with a meal.  . methimazole (TAPAZOLE) 10 MG tablet Take 10 mg by mouth daily.  Marland Kitchen omeprazole (PRILOSEC) 20 MG capsule Take 20 mg by mouth daily.   No facility-administered encounter medications on file as of 11/08/2018.    ALLERGIES: No Known Allergies  VACCINATION STATUS:  There is no immunization history on file for this patient.  HPI Andrea Shannon is 73 y.o. female who presents today with a medical history as above. she is returning with repeat thyroid function tests after she was seen for toxic multinodular goiter in a consult.    -She remains on methimazole 10 mg p.o. daily with clinical response.  She did not undergo her fine-needle aspiration of thyroid nodules.   She was found to have multinodular goiter on thyroid ultrasound in September 2019.  She denies dysphagia, shortness of breath, nor voice change.  -She denies recent weight change, palpitations, heat intolerance, tremors.  She reports family history of thyroid dysfunction, family history of thyroid malignancy.  Review of Systems  Constitutional: no weight gain/loss, no fatigue, no subjective hyperthermia, no subjective hypothermia Eyes: no blurry vision, no xerophthalmia ENT: no sore throat, no nodules palpated in throat, no dysphagia/odynophagia, no hoarseness  Musculoskeletal: no muscle/joint aches Skin: no rashes Neurological: no tremors, no numbness, no tingling, no dizziness Psychiatric: no depression, no anxiety  Objective:    BP 128/74   Pulse (!) 103   Ht 5\' 3"  (1.6 m)   Wt 145 lb (65.8 kg)   BMI 25.69 kg/m   Wt Readings from Last 3 Encounters:  11/08/18 145 lb (65.8 kg)  09/26/18 144 lb (65.3 kg)    Physical Exam  Constitutional: + Appropriate weight for height, not in acute distress, normal state of mind Eyes: PERRLA, EOMI, no exophthalmos ENT: moist mucous membranes, + palpable thyromegaly, no cervical lymphadenopathy Cardiovascular: normal precordial activity, Regular Rate and  Rhythm, no Murmur/Rubs/Gallops Respiratory:  adequate breathing efforts, no gross chest deformity, Clear to auscultation bilaterally Gastrointestinal: abdomen soft, Non -tender, No distension, Bowel Sounds present Musculoskeletal: no gross deformities, strength intact in all four extremities Skin: moist, warm, no rashes Neurological: no tremor with outstretched hands, Deep tendon reflexes normal in all four extremities.   Ultrasound of the thyroid on June 27, 2018 showed right lobe measuring 4.9 x 1.9 x 2.1 cm with 1 dominant mixed cystic and solid nodule measuring 2.2 x 1.5 x 1.8 cm.  Another nodule in the superior right lobe measuring 1.9 x 0.9 x 1.2 cm. Left lobe heterogeneous measuring 4.7 x 3.2 x 2.3 cm with dominant mixed cystic and solid nodule in the central left lobe measuring 2.6 x 2.4 x 2.3 cm.  Separate mixed cystic and solid nodule in the superior left lobe measuring 1.7 x 1.3 x 1.4 cm. Impression:  innumerable thyroid nodules, increasing probability of benignity.  2 suspicious right thyroid nodule measuring 1.9 x 0.9 x 1.2 cm in the superior right lobe.  Ultrasound-guided FNA is suggested.  More suspicious left thyroid nodule in the superior left lobe measuring 2.6 x 2.4 x 2.3 cm.  Ultrasound-guided FNA suggested.   Labs are on October 28, 2018 show TSH 0.48, free T4 0.96, TPO antibodies 11, free T3 4.0   Assessment & Plan:   1. Toxic multinodul goiter -She is responding clinically and biochemically to methimazole therapy.  She is advised to continue  methimazole to 10 mg daily.  She will be sent to radiology for repeat thyroid ultrasound and fine-needle aspiration of suspicious nodules.  See #2.  2.  Multinodular goiter - according to her prior thyroid ultrasound she has had 2 nodules, one on each lobe of her thyroid .  If she has suspicious FNA results, she will be approached for thyroidectomy.  This will make I-131 therapy unnecessary, however will require subsequent  thyroid hormone replacement.  She will return in 4 weeks with her biopsy results. - I advised her  to maintain close follow up with Monico Blitz, MD for primary care needs.   Follow up plan: Return in about 4 weeks (around 12/06/2018) for Follow up with Biopsy Results, Thyroid / Neck Ultrasound.   Glade Lloyd, MD [facility] [address] [place], [place] [address] Phone: [phone]  Fax: [phone]     11/08/2018, 1:43 PM  This note was partially dictated with voice recognition software. Similar sounding words can be transcribed inadequately or may not  be corrected upon review.

## 2018-11-11 DIAGNOSIS — Z1339 Encounter for screening examination for other mental health and behavioral disorders: Secondary | ICD-10-CM | POA: Diagnosis not present

## 2018-11-11 DIAGNOSIS — F419 Anxiety disorder, unspecified: Secondary | ICD-10-CM | POA: Diagnosis not present

## 2018-11-11 DIAGNOSIS — Z7189 Other specified counseling: Secondary | ICD-10-CM | POA: Diagnosis not present

## 2018-11-11 DIAGNOSIS — Z1211 Encounter for screening for malignant neoplasm of colon: Secondary | ICD-10-CM | POA: Diagnosis not present

## 2018-11-11 DIAGNOSIS — R5383 Other fatigue: Secondary | ICD-10-CM | POA: Diagnosis not present

## 2018-11-11 DIAGNOSIS — I10 Essential (primary) hypertension: Secondary | ICD-10-CM | POA: Diagnosis not present

## 2018-11-11 DIAGNOSIS — D649 Anemia, unspecified: Secondary | ICD-10-CM | POA: Diagnosis not present

## 2018-11-11 DIAGNOSIS — Z0000 Encounter for general adult medical examination without abnormal findings: Secondary | ICD-10-CM | POA: Diagnosis not present

## 2018-11-11 DIAGNOSIS — E0590 Thyrotoxicosis, unspecified without thyrotoxic crisis or storm: Secondary | ICD-10-CM | POA: Diagnosis not present

## 2018-11-11 DIAGNOSIS — Z79899 Other long term (current) drug therapy: Secondary | ICD-10-CM | POA: Diagnosis not present

## 2018-11-11 DIAGNOSIS — Z1331 Encounter for screening for depression: Secondary | ICD-10-CM | POA: Diagnosis not present

## 2018-11-11 DIAGNOSIS — Z6825 Body mass index (BMI) 25.0-25.9, adult: Secondary | ICD-10-CM | POA: Diagnosis not present

## 2018-11-11 DIAGNOSIS — Z299 Encounter for prophylactic measures, unspecified: Secondary | ICD-10-CM | POA: Diagnosis not present

## 2018-11-15 ENCOUNTER — Ambulatory Visit (HOSPITAL_COMMUNITY)
Admission: RE | Admit: 2018-11-15 | Discharge: 2018-11-15 | Disposition: A | Discharge status: Home / Self Care | Payer: Medicare Other | Source: Ambulatory Visit | Attending: "Endocrinology | Admitting: "Endocrinology

## 2018-11-15 DIAGNOSIS — E0520 Thyrotoxicosis with toxic multinodular goiter without thyrotoxic crisis or storm: Secondary | ICD-10-CM | POA: Diagnosis not present

## 2018-11-15 DIAGNOSIS — E042 Nontoxic multinodular goiter: Secondary | ICD-10-CM | POA: Diagnosis not present

## 2018-11-23 ENCOUNTER — Other Ambulatory Visit (HOSPITAL_COMMUNITY): Payer: Medicare Other

## 2018-11-24 ENCOUNTER — Ambulatory Visit (HOSPITAL_COMMUNITY): Admission: RE | Admit: 2018-11-24 | Payer: Medicare Other | Source: Ambulatory Visit

## 2018-11-30 ENCOUNTER — Ambulatory Visit (HOSPITAL_COMMUNITY)
Admission: RE | Admit: 2018-11-30 | Discharge: 2018-11-30 | Disposition: A | Discharge status: Home / Self Care | Payer: Medicare Other | Source: Ambulatory Visit | Attending: "Endocrinology | Admitting: "Endocrinology

## 2018-11-30 ENCOUNTER — Encounter (HOSPITAL_COMMUNITY): Payer: Self-pay

## 2018-11-30 DIAGNOSIS — E0520 Thyrotoxicosis with toxic multinodular goiter without thyrotoxic crisis or storm: Secondary | ICD-10-CM | POA: Diagnosis not present

## 2018-11-30 DIAGNOSIS — E079 Disorder of thyroid, unspecified: Secondary | ICD-10-CM | POA: Diagnosis not present

## 2018-11-30 DIAGNOSIS — E041 Nontoxic single thyroid nodule: Secondary | ICD-10-CM | POA: Diagnosis not present

## 2018-11-30 MED ORDER — LIDOCAINE HCL (PF) 2 % IJ SOLN
INTRAMUSCULAR | Status: AC
  Filled 2018-11-30: qty 10

## 2018-12-06 ENCOUNTER — Ambulatory Visit (INDEPENDENT_AMBULATORY_CARE_PROVIDER_SITE_OTHER): Payer: Medicare Other | Admitting: "Endocrinology

## 2018-12-06 ENCOUNTER — Encounter: Payer: Self-pay | Admitting: "Endocrinology

## 2018-12-06 VITALS — BP 132/76 | HR 88 | Ht 63.0 in | Wt 147.0 lb

## 2018-12-06 DIAGNOSIS — E0520 Thyrotoxicosis with toxic multinodular goiter without thyrotoxic crisis or storm: Secondary | ICD-10-CM

## 2018-12-06 DIAGNOSIS — R899 Unspecified abnormal finding in specimens from other organs, systems and tissues: Secondary | ICD-10-CM | POA: Insufficient documentation

## 2018-12-06 NOTE — Progress Notes (Signed)
Endocrinology follow-up Note                                            12/06/2018, 11:09 AM   Subjective:    Patient ID: Andrea Shannon, female    DOB: [birth year], PCP Monico Blitz, MD   Past Medical History:  Diagnosis Date  . Diabetes mellitus, type II ([place])   . Hyperthyroidism    Past Surgical History:  Procedure Laterality Date  . ABDOMINAL HYSTERECTOMY     Social History   Socioeconomic History  . Marital status: Widowed    Spouse name: Not on file  . Number of children: Not on file  . Years of education: Not on file  . Highest education level: Not on file  Occupational History  . Not on file  Social Needs  . Financial resource strain: Not on file  . Food insecurity:    Worry: Not on file    Inability: Not on file  . Transportation needs:    Medical: Not on file    Non-medical: Not on file  Tobacco Use  . Smoking status: Never Smoker  . Smokeless tobacco: Never Used  Substance and Sexual Activity  . Alcohol use: Not Currently    Frequency: Never  . Drug use: Never  . Sexual activity: Not on file  Lifestyle  . Physical activity:    Days per week: Not on file    Minutes per session: Not on file  . Stress: Not on file  Relationships  . Social connections:    Talks on phone: Not on file    Gets together: Not on file    Attends religious service: Not on file    Active member of club or organization: Not on file    Attends meetings of clubs or organizations: Not on file    Relationship status: Not on file  Other Topics Concern  . Not on file  Social History Narrative  . Not on file   Outpatient Encounter Medications as of 12/06/2018  Medication Sig  . aspirin 81 MG chewable tablet Chew by mouth daily.  . diclofenac (VOLTAREN) 75 MG EC tablet Take 75 mg by mouth daily.  Marland Kitchen lisinopril-hydrochlorothiazide (PRINZIDE,ZESTORETIC) 20-12.5 MG tablet Take 1 tablet by mouth daily.  . metFORMIN (GLUCOPHAGE) 500 MG tablet Take by mouth 2 (two) times  daily with a meal.  . methimazole (TAPAZOLE) 10 MG tablet Take 10 mg by mouth daily.  Marland Kitchen omeprazole (PRILOSEC) 20 MG capsule Take 20 mg by mouth daily.   No facility-administered encounter medications on file as of 12/06/2018.    ALLERGIES: No Known Allergies  VACCINATION STATUS:  There is no immunization history on file for this patient.  HPI Andrea Shannon is 73 y.o. female who presents today with a medical history as above. she is returning with fine-needle aspiration of her thyroid nodule after she was seen in consultation for toxic multinodular goiter.    -She remains on methimazole 10 mg p.o. daily with clinical response.   She was found to have multinodular goiter on thyroid ultrasound in September 2019.  Her biopsy results are consistent with atypia of undetermined significance, sample was sent for molecular study-results pending.   She denies dysphagia, shortness of breath, nor voice change.  -She denies recent weight change, palpitations, heat intolerance, tremors.  She reports family history of  thyroid dysfunction, family history of thyroid malignancy.   Review of Systems  Constitutional: + steady weight , no fatigue, no subjective hyperthermia, no subjective hypothermia Eyes: no blurry vision, no xerophthalmia ENT: no sore throat, no nodules palpated in throat, no dysphagia/odynophagia, no hoarseness  Musculoskeletal: no muscle/joint aches Skin: no rashes Neurological: no tremors, no numbness, no tingling, no dizziness Psychiatric: no depression, no anxiety  Objective:    BP 132/76   Pulse 88   Ht 5\' 3"  (1.6 m)   Wt 147 lb (66.7 kg)   BMI 26.04 kg/m   Wt Readings from Last 3 Encounters:  12/06/18 147 lb (66.7 kg)  11/08/18 145 lb (65.8 kg)  09/26/18 144 lb (65.3 kg)    Physical Exam  Constitutional: + Appropriate weight for height, not in acute distress, normal state of mind Eyes: PERRLA, EOMI, no exophthalmos ENT: moist mucous membranes, + palpable  thyromegaly, no cervical lymphadenopathy Musculoskeletal: no gross deformities, strength intact in all four extremities Skin: moist, warm, no rashes Neurological: no tremor with outstretched hands, Deep tendon reflexes normal in all four extremities.   Ultrasound of the thyroid on June 27, 2018 showed right lobe measuring 4.9 x 1.9 x 2.1 cm with 1 dominant mixed cystic and solid nodule measuring 2.2 x 1.5 x 1.8 cm.  Another nodule in the superior right lobe measuring 1.9 x 0.9 x 1.2 cm. Left lobe heterogeneous measuring 4.7 x 3.2 x 2.3 cm with dominant mixed cystic and solid nodule in the central left lobe measuring 2.6 x 2.4 x 2.3 cm.  Separate mixed cystic and solid nodule in the superior left lobe measuring 1.7 x 1.3 x 1.4 cm. Impression:  innumerable thyroid nodules, increasing probability of benignity.  2 suspicious right thyroid nodule measuring 1.9 x 0.9 x 1.2 cm in the superior right lobe.  Ultrasound-guided FNA is suggested.  More suspicious left thyroid nodule in the superior left lobe measuring 2.6 x 2.4 x 2.3 cm.  Ultrasound-guided FNA suggested.   Labs are on October 28, 2018 show TSH 0.48, free T4 0.96, TPO antibodies 11, free T3 4.0  Fine-needle aspiration on November 30, 2018 revealed atypia of undetermined significance, a sample was sent for afirma testing.   Assessment & Plan:   1. Toxic multinodul goiter -She is responding clinically and biochemically to methimazole therapy.  She is advised to continue methimazole to 10 mg daily.  She will have repeat thyroid function tests in 6 weeks. 2.  Multinodular goiter -Her fine-needle aspiration was significant for atypia of undetermined significance, a sample was sent for afirma testing, results pending. -She will return in 7 weeks to discuss her results.  If she is found to have significant risk for malignancy, she will be considered for thyroidectomy.  This will make I-131 therapy unnecessary, however will require subsequent  thyroid hormone replacement.  - I advised her  to maintain close follow up with Monico Blitz, MD for primary care needs.   Follow up plan: Return in about 7 weeks (around 01/24/2019) for Follow up with Pre-visit Labs.   Glade Lloyd, MD [facility] [address] [place], [place] [address] Phone: [phone]  Fax: [phone]     12/06/2018, 11:09 AM  This note was partially dictated with voice recognition software. Similar sounding words can be transcribed inadequately or may not  be corrected upon review.

## 2018-12-12 DIAGNOSIS — Z1231 Encounter for screening mammogram for malignant neoplasm of breast: Secondary | ICD-10-CM | POA: Diagnosis not present

## 2018-12-21 ENCOUNTER — Encounter (HOSPITAL_COMMUNITY): Payer: Self-pay

## 2018-12-21 DIAGNOSIS — I10 Essential (primary) hypertension: Secondary | ICD-10-CM | POA: Diagnosis not present

## 2018-12-21 DIAGNOSIS — E1165 Type 2 diabetes mellitus with hyperglycemia: Secondary | ICD-10-CM | POA: Diagnosis not present

## 2018-12-21 DIAGNOSIS — Z789 Other specified health status: Secondary | ICD-10-CM | POA: Diagnosis not present

## 2018-12-21 DIAGNOSIS — Z299 Encounter for prophylactic measures, unspecified: Secondary | ICD-10-CM | POA: Diagnosis not present

## 2018-12-21 DIAGNOSIS — R0602 Shortness of breath: Secondary | ICD-10-CM | POA: Diagnosis not present

## 2018-12-21 DIAGNOSIS — Z6825 Body mass index (BMI) 25.0-25.9, adult: Secondary | ICD-10-CM | POA: Diagnosis not present

## 2018-12-26 DIAGNOSIS — H401131 Primary open-angle glaucoma, bilateral, mild stage: Secondary | ICD-10-CM | POA: Diagnosis not present

## 2019-01-02 DIAGNOSIS — R0602 Shortness of breath: Secondary | ICD-10-CM | POA: Diagnosis not present

## 2019-01-10 DIAGNOSIS — I371 Nonrheumatic pulmonary valve insufficiency: Secondary | ICD-10-CM | POA: Diagnosis not present

## 2019-01-10 DIAGNOSIS — E0520 Thyrotoxicosis with toxic multinodular goiter without thyrotoxic crisis or storm: Secondary | ICD-10-CM | POA: Diagnosis not present

## 2019-01-10 DIAGNOSIS — I071 Rheumatic tricuspid insufficiency: Secondary | ICD-10-CM | POA: Diagnosis not present

## 2019-01-10 DIAGNOSIS — I340 Nonrheumatic mitral (valve) insufficiency: Secondary | ICD-10-CM | POA: Diagnosis not present

## 2019-01-17 ENCOUNTER — Encounter: Payer: Self-pay | Admitting: "Endocrinology

## 2019-01-17 ENCOUNTER — Ambulatory Visit (INDEPENDENT_AMBULATORY_CARE_PROVIDER_SITE_OTHER): Payer: Medicare Other | Admitting: "Endocrinology

## 2019-01-17 DIAGNOSIS — E0520 Thyrotoxicosis with toxic multinodular goiter without thyrotoxic crisis or storm: Secondary | ICD-10-CM | POA: Diagnosis not present

## 2019-01-17 NOTE — Progress Notes (Signed)
01/17/2019, 10:47 AM                                                    Endocrinology Telephone Visit Follow up Note -During COVID -19 Pandemic   Subjective:    Patient ID: Andrea Shannon, female    DOB: 05-11-1946, PCP Monico Blitz, MD   Past Medical History:  Diagnosis Date  . Diabetes mellitus, type II ([place])   . Hyperthyroidism    Past Surgical History:  Procedure Laterality Date  . ABDOMINAL HYSTERECTOMY     Social History   Socioeconomic History  . Marital status: Widowed    Spouse name: Not on file  . Number of children: Not on file  . Years of education: Not on file  . Highest education level: Not on file  Occupational History  . Not on file  Social Needs  . Financial resource strain: Not on file  . Food insecurity:    Worry: Not on file    Inability: Not on file  . Transportation needs:    Medical: Not on file    Non-medical: Not on file  Tobacco Use  . Smoking status: Never Smoker  . Smokeless tobacco: Never Used  Substance and Sexual Activity  . Alcohol use: Not Currently    Frequency: Never  . Drug use: Never  . Sexual activity: Not on file  Lifestyle  . Physical activity:    Days per week: Not on file    Minutes per session: Not on file  . Stress: Not on file  Relationships  . Social connections:    Talks on phone: Not on file    Gets together: Not on file    Attends religious service: Not on file    Active member of club or organization: Not on file    Attends meetings of clubs or organizations: Not on file    Relationship status: Not on file  Other Topics Concern  . Not on file  Social History Narrative  . Not on file   Outpatient Encounter Medications as of 01/17/2019  Medication Sig  . aspirin 81 MG chewable tablet Chew by mouth daily.  . diclofenac (VOLTAREN) 75 MG EC tablet Take 75 mg by mouth daily.  Marland Kitchen lisinopril-hydrochlorothiazide (PRINZIDE,ZESTORETIC) 20-12.5 MG tablet Take  1 tablet by mouth daily.  . metFORMIN (GLUCOPHAGE) 500 MG tablet Take by mouth 2 (two) times daily with a meal.  . methimazole (TAPAZOLE) 10 MG tablet Take 10 mg by mouth daily.  Marland Kitchen omeprazole (PRILOSEC) 20 MG capsule Take 20 mg by mouth daily.   No facility-administered encounter medications on file as of 01/17/2019.    ALLERGIES: No Known Allergies  VACCINATION STATUS:  There is no immunization history on file for this patient.  HPI Andrea Shannon is 73 y.o. female who presents today with a medical history as above. she is status post fine-needle aspiration of her thyroid nodule after she was seen in consultation for toxic multinodular goiter.   Her biopsy results are consistent with atypia of undetermined significance, sample was sent  for molecular study- results are consistent with benign finding with malignancy risk less than 4%.     -She remains on methimazole 10 mg p.o. daily with clinical response.   She was found to have multinodular goiter on thyroid ultrasound in September 2019.   She denies dysphagia, shortness of breath, nor voice change.  -She denies recent weight change, palpitations, heat intolerance, tremors.  She reports family history of thyroid dysfunction, family history of thyroid malignancy.    Objective:    There were no vitals taken for this visit.  Wt Readings from Last 3 Encounters:  12/06/18 147 lb (66.7 kg)  11/08/18 145 lb (65.8 kg)  09/26/18 144 lb (65.3 kg)       Ultrasound of the thyroid on June 27, 2018 showed right lobe measuring 4.9 x 1.9 x 2.1 cm with 1 dominant mixed cystic and solid nodule measuring 2.2 x 1.5 x 1.8 cm.  Another nodule in the superior right lobe measuring 1.9 x 0.9 x 1.2 cm. Left lobe heterogeneous measuring 4.7 x 3.2 x 2.3 cm with dominant mixed cystic and solid nodule in the central left lobe measuring 2.6 x 2.4 x 2.3 cm.  Separate mixed cystic and solid nodule in the superior left lobe measuring 1.7 x 1.3 x 1.4  cm. Impression:  innumerable thyroid nodules, increasing probability of benignity.  2 suspicious right thyroid nodule measuring 1.9 x 0.9 x 1.2 cm in the superior right lobe.  Ultrasound-guided FNA is suggested.  More suspicious left thyroid nodule in the superior left lobe measuring 2.6 x 2.4 x 2.3 cm.  Ultrasound-guided FNA suggested.   Labs are on October 28, 2018 show TSH 0.48, free T4 0.96, TPO antibodies 11, free T3 4.0  Fine-needle aspiration on November 30, 2018 revealed atypia of undetermined significance,  afirma testing-reported benign, with malignancy risk less than 4%.    January 10, 2019 labs: TSH 0.23, free T4 0.93, free T3 5.5.   Assessment & Plan:   1. Toxic multinodul goiter -She is responding clinically and biochemically to methimazole treatment.  She is advised to continue methimazole 10 mg p.o. daily, repeat thyroid function test in 3 months.     2.  Multinodular goiter -Her fine-needle aspiration was significant for atypia of undetermined significance, a sample was sent for afirma testing-with grossly benign finding with malignancy is less than 4%.  She would not require surgical intervention at this time.  -She will be considered for surveillance thyroid/neck ultrasound in 1-2 years time.  - I advised her  to maintain close follow up with Monico Blitz, MD for primary care needs.   Follow up plan: Return in about 3 months (around 04/18/2019) for Follow up with Pre-visit Labs.   Glade Lloyd, MD [facility] [address] [place], [place] [address] Phone: [phone]  Fax: [phone]     01/17/2019, 10:47 AM  This note was partially dictated with voice recognition software. Similar sounding words can be transcribed inadequately or may not  be corrected upon review.

## 2019-02-01 DIAGNOSIS — R0602 Shortness of breath: Secondary | ICD-10-CM | POA: Diagnosis not present

## 2019-02-01 DIAGNOSIS — I10 Essential (primary) hypertension: Secondary | ICD-10-CM | POA: Diagnosis not present

## 2019-02-01 DIAGNOSIS — Z299 Encounter for prophylactic measures, unspecified: Secondary | ICD-10-CM | POA: Diagnosis not present

## 2019-02-01 DIAGNOSIS — E1165 Type 2 diabetes mellitus with hyperglycemia: Secondary | ICD-10-CM | POA: Diagnosis not present

## 2019-02-01 DIAGNOSIS — Z6825 Body mass index (BMI) 25.0-25.9, adult: Secondary | ICD-10-CM | POA: Diagnosis not present

## 2019-02-01 DIAGNOSIS — E0590 Thyrotoxicosis, unspecified without thyrotoxic crisis or storm: Secondary | ICD-10-CM | POA: Diagnosis not present

## 2019-03-27 DIAGNOSIS — Z299 Encounter for prophylactic measures, unspecified: Secondary | ICD-10-CM | POA: Diagnosis not present

## 2019-03-27 DIAGNOSIS — I10 Essential (primary) hypertension: Secondary | ICD-10-CM | POA: Diagnosis not present

## 2019-03-27 DIAGNOSIS — Z6825 Body mass index (BMI) 25.0-25.9, adult: Secondary | ICD-10-CM | POA: Diagnosis not present

## 2019-03-27 DIAGNOSIS — Z713 Dietary counseling and surveillance: Secondary | ICD-10-CM | POA: Diagnosis not present

## 2019-03-27 DIAGNOSIS — E1165 Type 2 diabetes mellitus with hyperglycemia: Secondary | ICD-10-CM | POA: Diagnosis not present

## 2019-04-04 DIAGNOSIS — M79604 Pain in right leg: Secondary | ICD-10-CM | POA: Diagnosis not present

## 2019-04-04 DIAGNOSIS — E1140 Type 2 diabetes mellitus with diabetic neuropathy, unspecified: Secondary | ICD-10-CM | POA: Diagnosis not present

## 2019-04-04 DIAGNOSIS — M79605 Pain in left leg: Secondary | ICD-10-CM | POA: Diagnosis not present

## 2019-04-11 DIAGNOSIS — R202 Paresthesia of skin: Secondary | ICD-10-CM | POA: Diagnosis not present

## 2019-04-11 DIAGNOSIS — Z6825 Body mass index (BMI) 25.0-25.9, adult: Secondary | ICD-10-CM | POA: Diagnosis not present

## 2019-04-11 DIAGNOSIS — E0520 Thyrotoxicosis with toxic multinodular goiter without thyrotoxic crisis or storm: Secondary | ICD-10-CM | POA: Diagnosis not present

## 2019-04-11 DIAGNOSIS — E1165 Type 2 diabetes mellitus with hyperglycemia: Secondary | ICD-10-CM | POA: Diagnosis not present

## 2019-04-11 DIAGNOSIS — I10 Essential (primary) hypertension: Secondary | ICD-10-CM | POA: Diagnosis not present

## 2019-04-11 DIAGNOSIS — Z299 Encounter for prophylactic measures, unspecified: Secondary | ICD-10-CM | POA: Diagnosis not present

## 2019-04-11 DIAGNOSIS — R42 Dizziness and giddiness: Secondary | ICD-10-CM | POA: Diagnosis not present

## 2019-04-11 LAB — TSH: TSH: 1.26 (ref 0.41–5.90)

## 2019-04-13 DIAGNOSIS — R202 Paresthesia of skin: Secondary | ICD-10-CM | POA: Diagnosis not present

## 2019-04-13 DIAGNOSIS — E559 Vitamin D deficiency, unspecified: Secondary | ICD-10-CM | POA: Diagnosis not present

## 2019-04-18 ENCOUNTER — Encounter: Payer: Self-pay | Admitting: "Endocrinology

## 2019-04-18 ENCOUNTER — Other Ambulatory Visit: Payer: Self-pay

## 2019-04-18 ENCOUNTER — Ambulatory Visit (INDEPENDENT_AMBULATORY_CARE_PROVIDER_SITE_OTHER): Payer: Medicare Other | Admitting: "Endocrinology

## 2019-04-18 ENCOUNTER — Encounter (INDEPENDENT_AMBULATORY_CARE_PROVIDER_SITE_OTHER): Payer: Self-pay

## 2019-04-18 VITALS — BP 138/72 | HR 77 | Ht 63.0 in | Wt 144.0 lb

## 2019-04-18 DIAGNOSIS — E0520 Thyrotoxicosis with toxic multinodular goiter without thyrotoxic crisis or storm: Secondary | ICD-10-CM | POA: Diagnosis not present

## 2019-04-18 NOTE — Progress Notes (Signed)
04/18/2019, 4:52 PM   Endocrinology follow-up note    Subjective:    Patient ID: Andrea Shannon, female    DOB: 18-Oct-1946, PCP Monico Blitz, MD   Past Medical History:  Diagnosis Date  . Diabetes mellitus, type II ([place])   . Hyperthyroidism    Past Surgical History:  Procedure Laterality Date  . ABDOMINAL HYSTERECTOMY     Social History   Socioeconomic History  . Marital status: Widowed    Spouse name: Not on file  . Number of children: Not on file  . Years of education: Not on file  . Highest education level: Not on file  Occupational History  . Not on file  Social Needs  . Financial resource strain: Not on file  . Food insecurity    Worry: Not on file    Inability: Not on file  . Transportation needs    Medical: Not on file    Non-medical: Not on file  Tobacco Use  . Smoking status: Never Smoker  . Smokeless tobacco: Never Used  Substance and Sexual Activity  . Alcohol use: Not Currently    Frequency: Never  . Drug use: Never  . Sexual activity: Not on file  Lifestyle  . Physical activity    Days per week: Not on file    Minutes per session: Not on file  . Stress: Not on file  Relationships  . Social Herbalist on phone: Not on file    Gets together: Not on file    Attends religious service: Not on file    Active member of club or organization: Not on file    Attends meetings of clubs or organizations: Not on file    Relationship status: Not on file  Other Topics Concern  . Not on file  Social History Narrative  . Not on file   Outpatient Encounter Medications as of 04/18/2019  Medication Sig  . aspirin 81 MG chewable tablet Chew by mouth daily.  . diclofenac (VOLTAREN) 75 MG EC tablet Take 75 mg by mouth daily.  Marland Kitchen lisinopril-hydrochlorothiazide (PRINZIDE,ZESTORETIC) 20-12.5 MG tablet Take 1 tablet by mouth daily.  . metFORMIN (GLUCOPHAGE) 500 MG tablet Take by mouth daily with  breakfast.   . omeprazole (PRILOSEC) 20 MG capsule Take 20 mg by mouth daily.  . [DISCONTINUED] methimazole (TAPAZOLE) 10 MG tablet Take 10 mg by mouth daily.   No facility-administered encounter medications on file as of 04/18/2019.    ALLERGIES: No Known Allergies  VACCINATION STATUS:  There is no immunization history on file for this patient.  HPI Andrea Shannon is 73 y.o. female who presents today with a medical history as above. she is status post fine-needle aspiration of her thyroid nodule after she was seen in consultation for toxic multinodular goiter.   Her biopsy results are consistent with atypia of undetermined significance, sample was sent for molecular study- results are consistent with benign finding with malignancy risk less than 4%.     -She remains on methimazole 10 mg p.o. daily with clinical response.  Her previsit thyroid function tests are consistent with mild iatrogenic hypothyroidism. -She has no new complaints today.  -She denies recent weight change, palpitations, heat intolerance,  tremors.  She reports family history of thyroid dysfunction, family history of thyroid malignancy.    Objective:    BP 138/72   Pulse 77   Ht 5\' 3"  (1.6 m)   Wt 144 lb (65.3 kg)   BMI 25.51 kg/m   Wt Readings from Last 3 Encounters:  04/18/19 144 lb (65.3 kg)  12/06/18 147 lb (66.7 kg)  11/08/18 145 lb (65.8 kg)       Ultrasound of the thyroid on June 27, 2018 showed right lobe measuring 4.9 x 1.9 x 2.1 cm with 1 dominant mixed cystic and solid nodule measuring 2.2 x 1.5 x 1.8 cm.  Another nodule in the superior right lobe measuring 1.9 x 0.9 x 1.2 cm. Left lobe heterogeneous measuring 4.7 x 3.2 x 2.3 cm with dominant mixed cystic and solid nodule in the central left lobe measuring 2.6 x 2.4 x 2.3 cm.  Separate mixed cystic and solid nodule in the superior left lobe measuring 1.7 x 1.3 x 1.4 cm. Impression:  innumerable thyroid nodules, increasing probability of  benignity.  2 suspicious right thyroid nodule measuring 1.9 x 0.9 x 1.2 cm in the superior right lobe.  Ultrasound-guided FNA is suggested.  More suspicious left thyroid nodule in the superior left lobe measuring 2.6 x 2.4 x 2.3 cm.  Ultrasound-guided FNA suggested.  Recent Results (from the past 2160 hour(s))  TSH     Status: None   Collection Time: 04/11/19 12:00 AM  Result Value Ref Range   TSH 1.26 0.41 - 5.90    Comment: Free T4 0.75 (normal 0.82-1.77)    Fine-needle aspiration on November 30, 2018 revealed atypia of undetermined significance,  afirma testing-reported benign, with malignancy risk less than 4%.    January 10, 2019 labs: TSH 0.23, free T4 0.93, free T3 5.5.    Assessment & Plan:   1. Toxic multinodul goiter -She has adequately responded to treatment with methimazole.  She is advised to discontinue methimazole at this time with plan to repeat thyroid function test in 4 months with office visit.    2.  Multinodular goiter -Her fine-needle aspiration was significant for atypia of undetermined significance, a sample was sent for afirma testing-with grossly benign finding with malignancy is less than 4%.  She will not need surgical intervention at this time.       - I advised her  to maintain close follow up with Monico Blitz, MD for primary care needs.  Time for this visit: 15 minutes. Eli Phillips  participated in the discussions, expressed understanding, and voiced agreement with the above plans.  All questions were answered to her satisfaction. she is encouraged to contact clinic should she have any questions or concerns prior to her return visit.   Follow up plan: Return in about 4 months (around 08/18/2019) for Follow up with Pre-visit Labs.   Glade Lloyd, MD [facility] [address] [place], [place] [address] Phone: [phone]  Fax: [phone]     04/18/2019, 4:52 PM  This note was partially  dictated with voice recognition software. Similar sounding words can be transcribed inadequately or may not  be corrected upon review.

## 2019-05-01 DIAGNOSIS — H04123 Dry eye syndrome of bilateral lacrimal glands: Secondary | ICD-10-CM | POA: Diagnosis not present

## 2019-05-01 DIAGNOSIS — H401131 Primary open-angle glaucoma, bilateral, mild stage: Secondary | ICD-10-CM | POA: Diagnosis not present

## 2019-05-11 DIAGNOSIS — Z6825 Body mass index (BMI) 25.0-25.9, adult: Secondary | ICD-10-CM | POA: Diagnosis not present

## 2019-05-11 DIAGNOSIS — E0590 Thyrotoxicosis, unspecified without thyrotoxic crisis or storm: Secondary | ICD-10-CM | POA: Diagnosis not present

## 2019-05-11 DIAGNOSIS — E1165 Type 2 diabetes mellitus with hyperglycemia: Secondary | ICD-10-CM | POA: Diagnosis not present

## 2019-05-11 DIAGNOSIS — M25511 Pain in right shoulder: Secondary | ICD-10-CM | POA: Diagnosis not present

## 2019-05-11 DIAGNOSIS — G629 Polyneuropathy, unspecified: Secondary | ICD-10-CM | POA: Diagnosis not present

## 2019-05-11 DIAGNOSIS — I10 Essential (primary) hypertension: Secondary | ICD-10-CM | POA: Diagnosis not present

## 2019-05-11 DIAGNOSIS — Z299 Encounter for prophylactic measures, unspecified: Secondary | ICD-10-CM | POA: Diagnosis not present

## 2019-05-15 DIAGNOSIS — M25511 Pain in right shoulder: Secondary | ICD-10-CM | POA: Diagnosis not present

## 2019-06-21 DIAGNOSIS — M79672 Pain in left foot: Secondary | ICD-10-CM | POA: Diagnosis not present

## 2019-06-21 DIAGNOSIS — M79671 Pain in right foot: Secondary | ICD-10-CM | POA: Diagnosis not present

## 2019-06-21 DIAGNOSIS — M25579 Pain in unspecified ankle and joints of unspecified foot: Secondary | ICD-10-CM | POA: Diagnosis not present

## 2019-07-10 DIAGNOSIS — E1165 Type 2 diabetes mellitus with hyperglycemia: Secondary | ICD-10-CM | POA: Diagnosis not present

## 2019-07-10 DIAGNOSIS — E1143 Type 2 diabetes mellitus with diabetic autonomic (poly)neuropathy: Secondary | ICD-10-CM | POA: Diagnosis not present

## 2019-07-10 DIAGNOSIS — I10 Essential (primary) hypertension: Secondary | ICD-10-CM | POA: Diagnosis not present

## 2019-07-10 DIAGNOSIS — R309 Painful micturition, unspecified: Secondary | ICD-10-CM | POA: Diagnosis not present

## 2019-07-10 DIAGNOSIS — J309 Allergic rhinitis, unspecified: Secondary | ICD-10-CM | POA: Diagnosis not present

## 2019-07-10 DIAGNOSIS — Z6824 Body mass index (BMI) 24.0-24.9, adult: Secondary | ICD-10-CM | POA: Diagnosis not present

## 2019-07-10 DIAGNOSIS — Z299 Encounter for prophylactic measures, unspecified: Secondary | ICD-10-CM | POA: Diagnosis not present

## 2019-07-10 DIAGNOSIS — M1990 Unspecified osteoarthritis, unspecified site: Secondary | ICD-10-CM | POA: Diagnosis not present

## 2019-07-10 DIAGNOSIS — E0590 Thyrotoxicosis, unspecified without thyrotoxic crisis or storm: Secondary | ICD-10-CM | POA: Diagnosis not present

## 2019-07-26 DIAGNOSIS — Z299 Encounter for prophylactic measures, unspecified: Secondary | ICD-10-CM | POA: Diagnosis not present

## 2019-07-26 DIAGNOSIS — H43399 Other vitreous opacities, unspecified eye: Secondary | ICD-10-CM | POA: Diagnosis not present

## 2019-07-26 DIAGNOSIS — E0590 Thyrotoxicosis, unspecified without thyrotoxic crisis or storm: Secondary | ICD-10-CM | POA: Diagnosis not present

## 2019-07-26 DIAGNOSIS — I10 Essential (primary) hypertension: Secondary | ICD-10-CM | POA: Diagnosis not present

## 2019-07-26 DIAGNOSIS — H9192 Unspecified hearing loss, left ear: Secondary | ICD-10-CM | POA: Diagnosis not present

## 2019-07-26 DIAGNOSIS — Z6824 Body mass index (BMI) 24.0-24.9, adult: Secondary | ICD-10-CM | POA: Diagnosis not present

## 2019-08-09 IMAGING — US US FNA BIOPSY THYROID 1ST LESION
1 series · 13 of 18 positions shown · non-contrast
Comparison: US Thyroid 11/15/18

MEDICATIONS:
8 cc 1% Lidocaine

COMPLICATIONS:
None immediate.

INDICATION: Indeterminate thyroid nodule

Left inferior thyroid nodule
3.0 cm
EXAM:
ULTRASOUND GUIDED FINE NEEDLE ASPIRATION OF INDETERMINATE THYROID
NODULE
TECHNIQUE: Informed written consent was obtained from the patient after a
discussion of the risks, benefits and alternatives to treatment.
Questions regarding the procedure were encouraged and answered. A
timeout was performed prior to the initiation of the procedure.

[Series 1: us fna biopsy thyroid 1st lesion · 18 acquisitions, 13 frames shown]
[im 1/18]
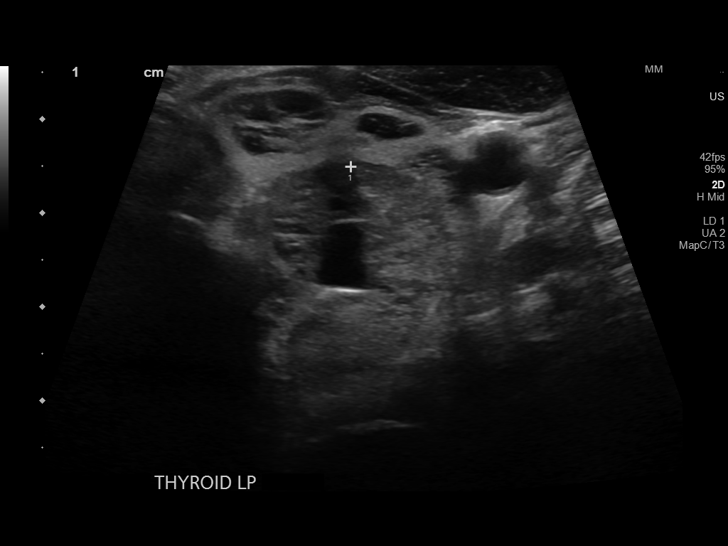
[im 3/18]
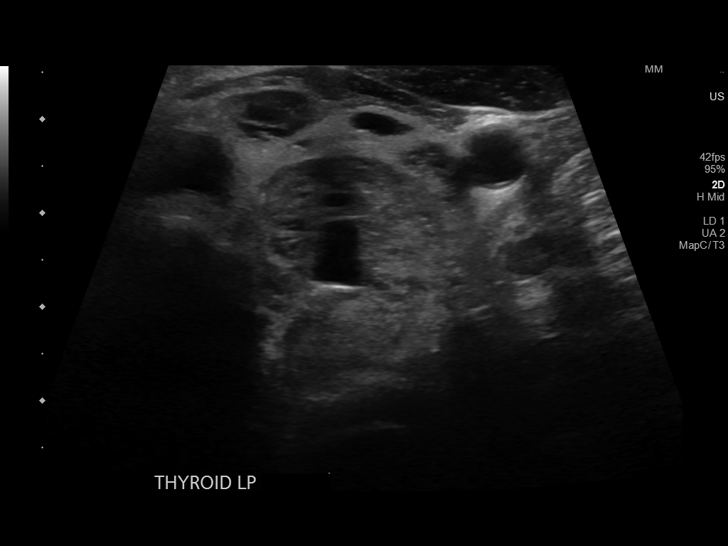
[im 4/18]
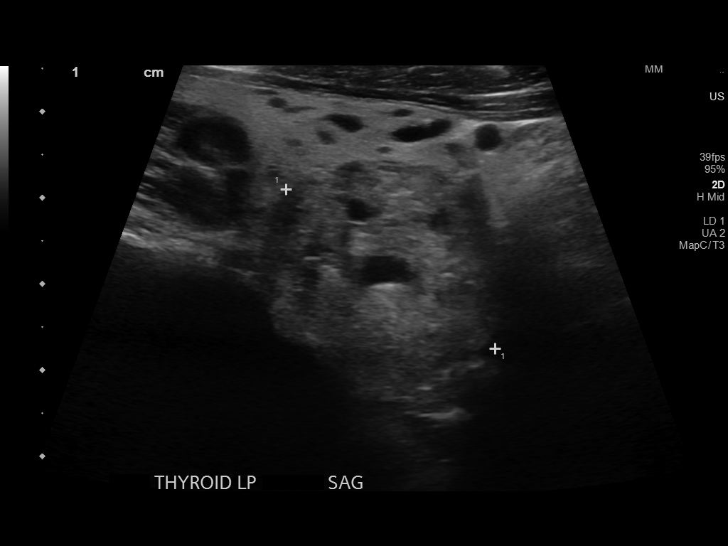
[im 5/18]
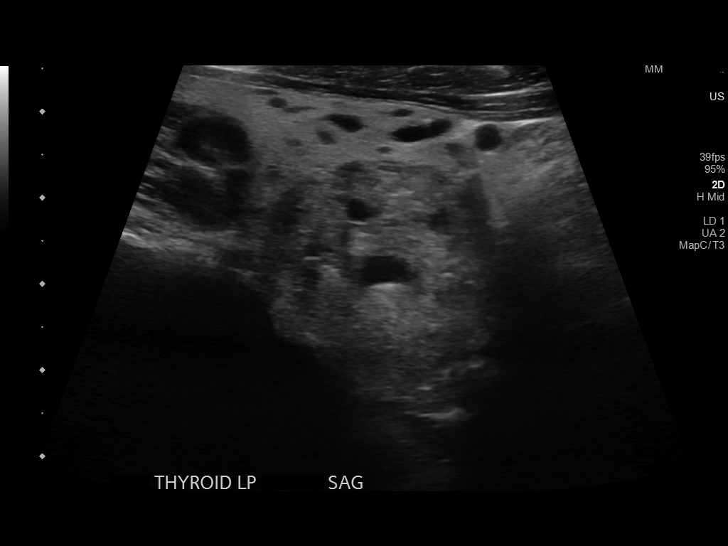
[im 7/18]
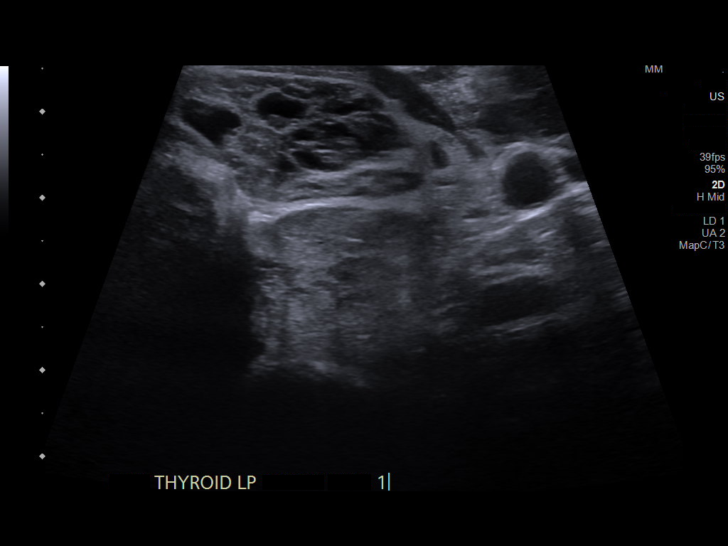
[im 8/18]
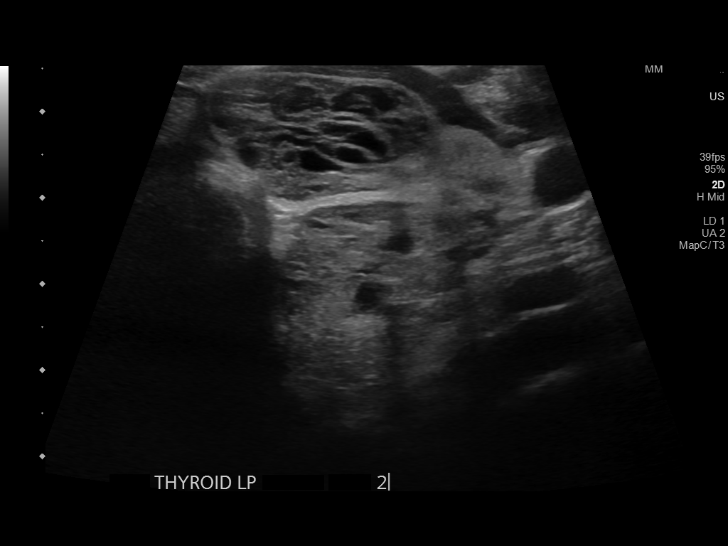
[im 10/18]
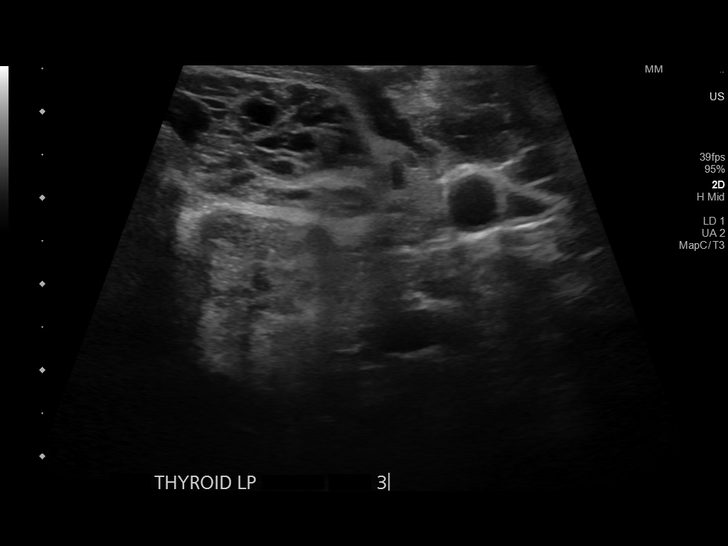
[im 11/18]
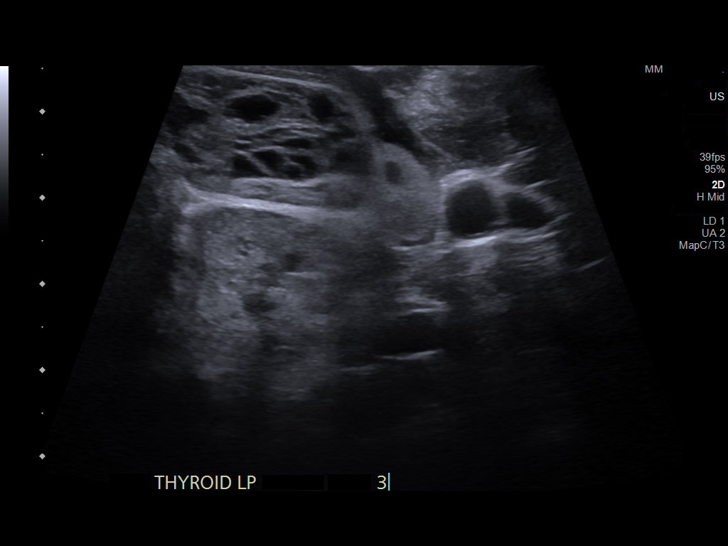
[im 12/18]
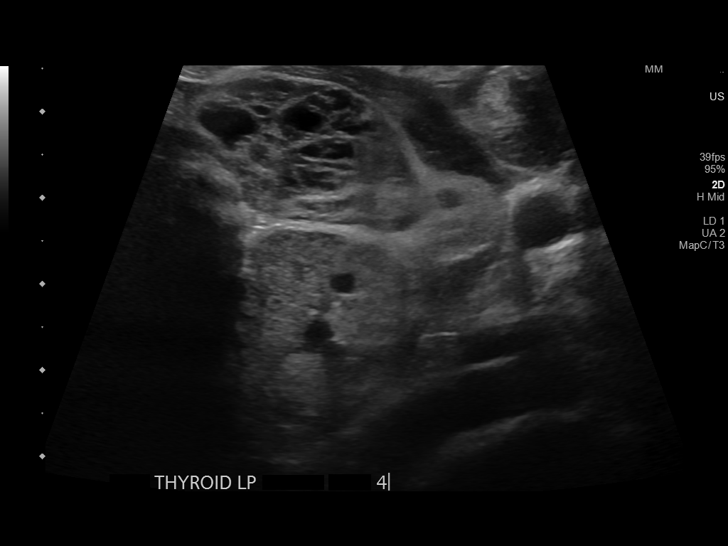
[im 14/18]
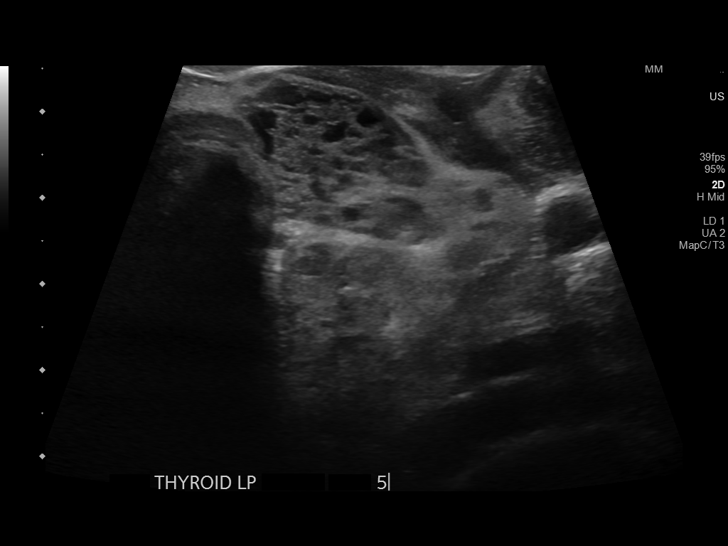
[im 15/18]
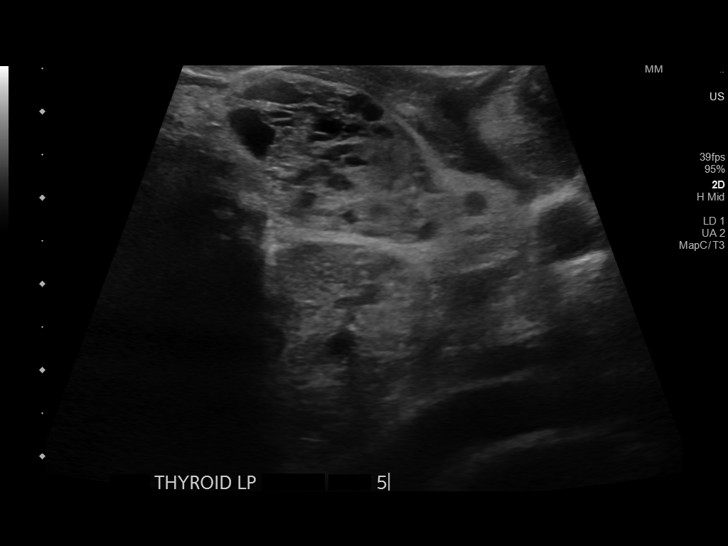
[im 16/18]
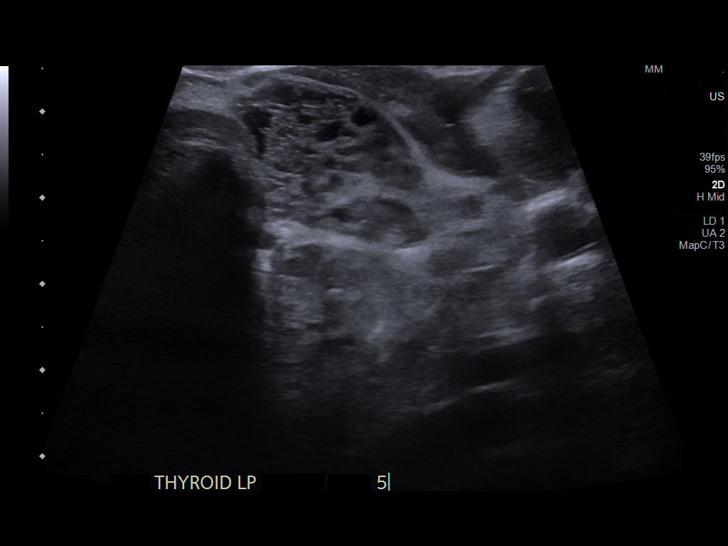
[im 18/18]
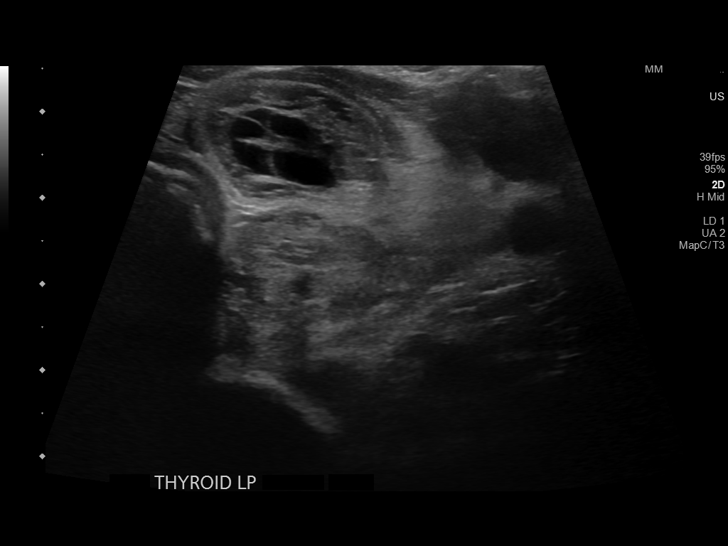

[13 of 18 positions shown; findings below may reference images not displayed]

Pre-procedural ultrasound scanning demonstrated unchanged size and
appearance of the indeterminate nodule within the left thyroid

The procedure was planned. The neck was prepped in the usual sterile
fashion, and a sterile drape was applied covering the operative
field. A timeout was performed prior to the initiation of the
procedure. Local anesthesia was provided with 1% lidocaine.

Under direct ultrasound guidance, 5 FNA biopsies were performed of
the left inferior thyroid nodule with a 25 gauge needle.

2 of these samples were obtained for AFIRMA per ordering AICHATOU

Multiple ultrasound images were saved for procedural documentation
purposes. The samples were prepared and submitted to pathology.

Limited post procedural scanning was negative for hematoma or
additional complication. Dressings were placed. The patient
tolerated the above procedures procedure well without immediate
postprocedural complication.
FINDINGS: Nodule reference number based on prior diagnostic ultrasound: 7

Maximum size: 3.0 cm

Location: Left; Inferior

ACR TI-RADS risk category: TR4 (4-6 points)

Reason for biopsy: meets ACR TI-RADS criteria

Ultrasound imaging confirms appropriate placement of the needles
within the thyroid nodule.
IMPRESSION: Technically successful ultrasound guided fine needle aspiration of
left inferior thyroid nodule

Read by

Chyno Bartel

## 2019-08-14 DIAGNOSIS — E0520 Thyrotoxicosis with toxic multinodular goiter without thyrotoxic crisis or storm: Secondary | ICD-10-CM | POA: Diagnosis not present

## 2019-08-14 LAB — TSH: TSH: 0.01 — AB (ref 0.41–5.90)

## 2019-08-21 ENCOUNTER — Ambulatory Visit (INDEPENDENT_AMBULATORY_CARE_PROVIDER_SITE_OTHER): Payer: Medicare Other | Admitting: "Endocrinology

## 2019-08-21 ENCOUNTER — Encounter: Payer: Self-pay | Admitting: "Endocrinology

## 2019-08-21 ENCOUNTER — Other Ambulatory Visit: Payer: Self-pay

## 2019-08-21 DIAGNOSIS — E0520 Thyrotoxicosis with toxic multinodular goiter without thyrotoxic crisis or storm: Secondary | ICD-10-CM | POA: Diagnosis not present

## 2019-08-21 NOTE — Progress Notes (Signed)
08/21/2019, 12:28 PM                                  Endocrinology Telehealth Visit Follow up Note -During COVID -19 Pandemic  I connected with Andrea Shannon on 08/21/2019   by telephone and verified that I am speaking with the correct person using two identifiers. Andrea Shannon, [birth year]. she has verbally consented to this visit. All issues noted in this document were discussed and addressed. The format was not optimal for physical exam.   Subjective:    Patient ID: Andrea Shannon, female    DOB: 01-Aug-1946, PCP Andrea Blitz, MD   Past Medical History:  Diagnosis Date  . Diabetes mellitus, type II ([place])   . Hyperthyroidism    Past Surgical History:  Procedure Laterality Date  . ABDOMINAL HYSTERECTOMY     Social History   Socioeconomic History  . Marital status: Widowed    Spouse name: Not on file  . Number of children: Not on file  . Years of education: Not on file  . Highest education level: Not on file  Occupational History  . Not on file  Social Needs  . Financial resource strain: Not on file  . Food insecurity    Worry: Not on file    Inability: Not on file  . Transportation needs    Medical: Not on file    Non-medical: Not on file  Tobacco Use  . Smoking status: Never Smoker  . Smokeless tobacco: Never Used  Substance and Sexual Activity  . Alcohol use: Not Currently    Frequency: Never  . Drug use: Never  . Sexual activity: Not on file  Lifestyle  . Physical activity    Days per week: Not on file    Minutes per session: Not on file  . Stress: Not on file  Relationships  . Social Herbalist on phone: Not on file    Gets together: Not on file    Attends religious service: Not on file    Active member of club or organization: Not on file    Attends meetings of clubs or organizations: Not on file    Relationship status: Not on file  Other Topics Concern  . Not on file  Social  History Narrative  . Not on file   Outpatient Encounter Medications as of 08/21/2019  Medication Sig  . aspirin 81 MG chewable tablet Chew by mouth daily.  . diclofenac (VOLTAREN) 75 MG EC tablet Take 75 mg by mouth daily.  Marland Kitchen lisinopril-hydrochlorothiazide (PRINZIDE,ZESTORETIC) 20-12.5 MG tablet Take 1 tablet by mouth daily.  . metFORMIN (GLUCOPHAGE) 500 MG tablet Take by mouth daily with breakfast.   . omeprazole (PRILOSEC) 20 MG capsule Take 20 mg by mouth daily.   No facility-administered encounter medications on file as of 08/21/2019.    ALLERGIES: No Known Allergies  VACCINATION STATUS:  There is no immunization history on file for this patient.  HPI Andrea Shannon is 73 y.o. female who presents today with a medical history as above. she is status post  fine-needle aspiration of her thyroid nodule after she was seen in  consultation for toxic multinodular goiter.   Her biopsy results are consistent with atypia of undetermined significance, sample was sent for molecular study- results are consistent with benign finding with malignancy risk less than 4%.    She was taken off of methimazole during her last visit after adequate clinical response for toxic multinodular goiter with clinical hyperthyroidism. -Her previsit thyroid function tests are consistent with relapsing hyperthyroidism.  She has lost 5 pounds, reports heat intolerance and on and off palpitations. She reports family history of thyroid dysfunction, family history of thyroid malignancy.  Review of systems: Limited as above.  Objective:    There were no vitals taken for this visit.  Wt Readings from Last 3 Encounters:  04/18/19 144 lb (65.3 kg)  12/06/18 147 lb (66.7 kg)  11/08/18 145 lb (65.8 kg)       Ultrasound of the thyroid on June 27, 2018 showed right lobe measuring 4.9 x 1.9 x 2.1 cm with 1 dominant mixed cystic and solid nodule measuring 2.2 x 1.5 x 1.8 cm.  Another nodule in the superior right lobe  measuring 1.9 x 0.9 x 1.2 cm. Left lobe heterogeneous measuring 4.7 x 3.2 x 2.3 cm with dominant mixed cystic and solid nodule in the central left lobe measuring 2.6 x 2.4 x 2.3 cm.  Separate mixed cystic and solid nodule in the superior left lobe measuring 1.7 x 1.3 x 1.4 cm. Impression:  innumerable thyroid nodules, increasing probability of benignity.  2 suspicious right thyroid nodule measuring 1.9 x 0.9 x 1.2 cm in the superior right lobe.  Ultrasound-guided FNA is suggested.  More suspicious left thyroid nodule in the superior left lobe measuring 2.6 x 2.4 x 2.3 cm.  Ultrasound-guided FNA suggested.  Recent Results (from the past 2160 hour(s))  TSH     Status: Abnormal   Collection Time: 08/14/19 12:00 AM  Result Value Ref Range   TSH 0.01 (A) 0.41 - 5.90    Comment: free t4 2.57 high    Fine-needle aspiration on November 30, 2018 revealed atypia of undetermined significance,  afirma testing-reported benign, with malignancy risk less than 4%.    January 10, 2019 labs: TSH 0.23, free T4 0.93, free T3 5.5.    Assessment & Plan:   1. Toxic multinodul goiter -She has previously responded to methimazole treatment.  She would need thyroid uptake and scan to decide whether she needs ablative treatment with I-131.  She is advised to stay off of methimazole for now.   2.  Multinodular goiter -Her fine-needle aspiration was significant for atypia of undetermined significance, a sample was sent for afirma testing-with grossly benign finding with malignancy is less than 4%.  She will not need surgical intervention at this time.       - I advised her  to maintain close follow up with Andrea Blitz, MD for primary care needs.   Time for this visit: 15 minutes. Andrea Shannon  participated in the discussions, expressed understanding, and voiced agreement with the above plans.  All questions were answered to her satisfaction. she is encouraged to contact clinic should she have any questions or  concerns prior to her return visit.   Follow up plan: Return in about 2 weeks (around 09/04/2019), or Phone, for Follow up with Thyroid Uptake and Scan.   Glade Lloyd, MD [facility] [facility] [address] [place], [place] [address] Phone: [phone]  Fax: [phone]     08/21/2019, 12:28 PM  This note  was partially dictated with voice recognition software. Similar sounding words can be transcribed inadequately or may not  be corrected upon review.

## 2019-08-24 ENCOUNTER — Encounter (HOSPITAL_COMMUNITY): Payer: Medicare Other

## 2019-08-25 ENCOUNTER — Encounter (HOSPITAL_COMMUNITY): Payer: Medicare Other

## 2019-08-28 DIAGNOSIS — H524 Presbyopia: Secondary | ICD-10-CM | POA: Diagnosis not present

## 2019-08-28 DIAGNOSIS — H401131 Primary open-angle glaucoma, bilateral, mild stage: Secondary | ICD-10-CM | POA: Diagnosis not present

## 2019-08-28 DIAGNOSIS — E119 Type 2 diabetes mellitus without complications: Secondary | ICD-10-CM | POA: Diagnosis not present

## 2019-08-28 DIAGNOSIS — H5203 Hypermetropia, bilateral: Secondary | ICD-10-CM | POA: Diagnosis not present

## 2019-08-28 DIAGNOSIS — H52223 Regular astigmatism, bilateral: Secondary | ICD-10-CM | POA: Diagnosis not present

## 2019-09-04 ENCOUNTER — Ambulatory Visit: Payer: Medicare Other | Admitting: "Endocrinology

## 2019-09-04 DIAGNOSIS — E0520 Thyrotoxicosis with toxic multinodular goiter without thyrotoxic crisis or storm: Secondary | ICD-10-CM | POA: Diagnosis not present

## 2019-09-07 ENCOUNTER — Encounter: Payer: Self-pay | Admitting: "Endocrinology

## 2019-09-07 ENCOUNTER — Other Ambulatory Visit: Payer: Self-pay

## 2019-09-07 ENCOUNTER — Ambulatory Visit (INDEPENDENT_AMBULATORY_CARE_PROVIDER_SITE_OTHER): Payer: Medicare Other | Admitting: "Endocrinology

## 2019-09-07 DIAGNOSIS — E0520 Thyrotoxicosis with toxic multinodular goiter without thyrotoxic crisis or storm: Secondary | ICD-10-CM | POA: Diagnosis not present

## 2019-09-07 NOTE — Progress Notes (Signed)
09/07/2019, 11:46 AM                                  Endocrinology Telehealth Visit Follow up Note -During COVID -19 Pandemic  I connected with Andrea Shannon on 09/07/2019   by telephone and verified that I am speaking with the correct person using two identifiers. Andrea Shannon, [birth year]. she has verbally consented to this visit. All issues noted in this document were discussed and addressed. The format was not optimal for physical exam.   Subjective:    Patient ID: Andrea Shannon, female    DOB: Jan 23, 1946, PCP Monico Blitz, MD   Past Medical History:  Diagnosis Date  . Diabetes mellitus, type II ([place])   . Hyperthyroidism    Past Surgical History:  Procedure Laterality Date  . ABDOMINAL HYSTERECTOMY     Social History   Socioeconomic History  . Marital status: Widowed    Spouse name: Not on file  . Number of children: Not on file  . Years of education: Not on file  . Highest education level: Not on file  Occupational History  . Not on file  Social Needs  . Financial resource strain: Not on file  . Food insecurity    Worry: Not on file    Inability: Not on file  . Transportation needs    Medical: Not on file    Non-medical: Not on file  Tobacco Use  . Smoking status: Never Smoker  . Smokeless tobacco: Never Used  Substance and Sexual Activity  . Alcohol use: Not Currently    Frequency: Never  . Drug use: Never  . Sexual activity: Not on file  Lifestyle  . Physical activity    Days per week: Not on file    Minutes per session: Not on file  . Stress: Not on file  Relationships  . Social Herbalist on phone: Not on file    Gets together: Not on file    Attends religious service: Not on file    Active member of club or organization: Not on file    Attends meetings of clubs or organizations: Not on file    Relationship status: Not on file  Other Topics Concern  . Not on file  Social  History Narrative  . Not on file   Outpatient Encounter Medications as of 09/07/2019  Medication Sig  . aspirin 81 MG chewable tablet Chew by mouth daily.  . diclofenac (VOLTAREN) 75 MG EC tablet Take 75 mg by mouth daily.  Marland Kitchen lisinopril-hydrochlorothiazide (PRINZIDE,ZESTORETIC) 20-12.5 MG tablet Take 1 tablet by mouth daily.  . metFORMIN (GLUCOPHAGE) 500 MG tablet Take by mouth daily with breakfast.   . omeprazole (PRILOSEC) 20 MG capsule Take 20 mg by mouth daily.   No facility-administered encounter medications on file as of 09/07/2019.    ALLERGIES: No Known Allergies  VACCINATION STATUS:  There is no immunization history on file for this patient.  HPI Andrea Shannon is 73 y.o. female who presents today with a medical history as above. she is status post  fine-needle aspiration of her thyroid nodule after she was seen in  consultation for toxic multinodular goiter.   Her biopsy results are consistent with atypia of undetermined significance, sample was sent for molecular study- results are consistent with benign finding with malignancy risk less than 4%.    She was previously treated with methimazole with mixed results.  Her most recent labs showed still suppressed TSH and her uptake and scan is consistent with toxic multinodular goiter with 24-hour uptake of 35%.     She has lost 5 pounds, reports heat intolerance and on and off palpitations. She reports family history of thyroid dysfunction, family history of thyroid malignancy.  Review of systems: Limited as above.  Objective:    There were no vitals taken for this visit.  Wt Readings from Last 3 Encounters:  04/18/19 144 lb (65.3 kg)  12/06/18 147 lb (66.7 kg)  11/08/18 145 lb (65.8 kg)       Ultrasound of the thyroid on June 27, 2018 showed right lobe measuring 4.9 x 1.9 x 2.1 cm with 1 dominant mixed cystic and solid nodule measuring 2.2 x 1.5 x 1.8 cm.  Another nodule in the superior right lobe measuring 1.9 x  0.9 x 1.2 cm. Left lobe heterogeneous measuring 4.7 x 3.2 x 2.3 cm with dominant mixed cystic and solid nodule in the central left lobe measuring 2.6 x 2.4 x 2.3 cm.  Separate mixed cystic and solid nodule in the superior left lobe measuring 1.7 x 1.3 x 1.4 cm. Impression:  innumerable thyroid nodules, increasing probability of benignity.  2 suspicious right thyroid nodule measuring 1.9 x 0.9 x 1.2 cm in the superior right lobe.  Ultrasound-guided FNA is suggested.  More suspicious left thyroid nodule in the superior left lobe measuring 2.6 x 2.4 x 2.3 cm.  Ultrasound-guided FNA suggested.  Recent Results (from the past 2160 hour(s))  TSH     Status: Abnormal   Collection Time: 08/14/19 12:00 AM  Result Value Ref Range   TSH 0.01 (A) 0.41 - 5.90    Comment: free t4 2.57 high    Fine-needle aspiration on November 30, 2018 revealed atypia of undetermined significance,  afirma testing-reported benign, with malignancy risk less than 4%.    January 10, 2019 labs: TSH 0.23, free T4 0.93, free T3 5.5.  Thyroid uptake and scan on September 04, 2019 showed enlarged thyroid gland with several focal areas of increased uptake, likely representing nodules.  No areas of photopenia.  24-hour uptake 35%.  Assessment & Plan:   1. Toxic multinodul goiter -She has previously responded to methimazole treatment, with subsequent relapse.  Her uptake and scan is consistent with toxic multinodular goiter.  She is approached for I-131 thyroid ablation and she agrees.  This treatment will be scheduled to be administered closer to her home in [place].   She will return in 9 weeks with repeat thyroid function tests.  She is advised to stay off of methimazole.   2.  Multinodular goiter -Her fine-needle aspiration was significant for atypia of undetermined significance, a sample was sent for afirma testing-with grossly benign finding with malignancy is less than 4%.  She will not need surgical intervention at  this time.     - I advised her  to maintain close follow up with Monico Blitz, MD for primary care needs.    Time for this visit: 15 minutes. Andrea Shannon  participated in the discussions, expressed understanding, and voiced agreement with the above plans.  All questions were answered to her satisfaction. she is encouraged to  contact clinic should she have any questions or concerns prior to her return visit.    Follow up plan: Return in about 9 weeks (around 11/09/2019) for Follow up with Labs after I131 Therapy.   Glade Lloyd, MD [facility] [address] [place], [place] [address] Phone: [phone]  Fax: [phone]     09/07/2019, 11:46 AM  This note was partially dictated with voice recognition software. Similar sounding words can be transcribed inadequately or may not  be corrected upon review.

## 2019-09-29 DIAGNOSIS — E0520 Thyrotoxicosis with toxic multinodular goiter without thyrotoxic crisis or storm: Secondary | ICD-10-CM | POA: Diagnosis not present

## 2019-10-02 ENCOUNTER — Telehealth: Payer: Self-pay | Admitting: "Endocrinology

## 2019-10-02 NOTE — Telephone Encounter (Signed)
Pt would like you to call her.

## 2019-10-03 NOTE — Telephone Encounter (Signed)
Pt had questions about when she needed to come back in office and also when to have labs done. Appt needed to be rescheduled.

## 2019-10-09 ENCOUNTER — Telehealth: Payer: Self-pay | Admitting: "Endocrinology

## 2019-10-09 NOTE — Telephone Encounter (Signed)
Faxed to Dr Manuella Ghazi

## 2019-10-09 NOTE — Telephone Encounter (Signed)
Pt said for [place] to fax her thyroid results to Dr Manuella Ghazi because she sees him on 12/28 but I do not see anything in the chart that is recent. She said she just took the pill and had them done. Do you know?

## 2019-11-10 ENCOUNTER — Ambulatory Visit: Payer: Medicare Other | Admitting: "Endocrinology

## 2019-11-16 DIAGNOSIS — M19011 Primary osteoarthritis, right shoulder: Secondary | ICD-10-CM | POA: Diagnosis not present

## 2019-11-16 DIAGNOSIS — R5383 Other fatigue: Secondary | ICD-10-CM | POA: Diagnosis not present

## 2019-11-16 DIAGNOSIS — R3989 Other symptoms and signs involving the genitourinary system: Secondary | ICD-10-CM | POA: Diagnosis not present

## 2019-11-16 DIAGNOSIS — Z1211 Encounter for screening for malignant neoplasm of colon: Secondary | ICD-10-CM | POA: Diagnosis not present

## 2019-11-16 DIAGNOSIS — Z299 Encounter for prophylactic measures, unspecified: Secondary | ICD-10-CM | POA: Diagnosis not present

## 2019-11-16 DIAGNOSIS — Z79899 Other long term (current) drug therapy: Secondary | ICD-10-CM | POA: Diagnosis not present

## 2019-11-16 DIAGNOSIS — Z1339 Encounter for screening examination for other mental health and behavioral disorders: Secondary | ICD-10-CM | POA: Diagnosis not present

## 2019-11-16 DIAGNOSIS — D649 Anemia, unspecified: Secondary | ICD-10-CM | POA: Diagnosis not present

## 2019-11-16 DIAGNOSIS — Z7189 Other specified counseling: Secondary | ICD-10-CM | POA: Diagnosis not present

## 2019-11-16 DIAGNOSIS — I10 Essential (primary) hypertension: Secondary | ICD-10-CM | POA: Diagnosis not present

## 2019-11-16 DIAGNOSIS — Z0000 Encounter for general adult medical examination without abnormal findings: Secondary | ICD-10-CM | POA: Diagnosis not present

## 2019-11-16 DIAGNOSIS — Z1331 Encounter for screening for depression: Secondary | ICD-10-CM | POA: Diagnosis not present

## 2019-11-16 DIAGNOSIS — E0590 Thyrotoxicosis, unspecified without thyrotoxic crisis or storm: Secondary | ICD-10-CM | POA: Diagnosis not present

## 2019-11-16 DIAGNOSIS — Z2821 Immunization not carried out because of patient refusal: Secondary | ICD-10-CM | POA: Diagnosis not present

## 2019-11-16 DIAGNOSIS — Z6824 Body mass index (BMI) 24.0-24.9, adult: Secondary | ICD-10-CM | POA: Diagnosis not present

## 2019-11-20 DIAGNOSIS — E0520 Thyrotoxicosis with toxic multinodular goiter without thyrotoxic crisis or storm: Secondary | ICD-10-CM | POA: Diagnosis not present

## 2019-11-20 LAB — TSH: TSH: 0.01 — AB (ref 0.41–5.90)

## 2019-11-27 ENCOUNTER — Encounter: Payer: Self-pay | Admitting: "Endocrinology

## 2019-11-27 ENCOUNTER — Ambulatory Visit (INDEPENDENT_AMBULATORY_CARE_PROVIDER_SITE_OTHER): Payer: Medicare Other | Admitting: "Endocrinology

## 2019-11-27 ENCOUNTER — Other Ambulatory Visit: Payer: Self-pay

## 2019-11-27 VITALS — BP 131/69 | HR 93 | Ht 63.0 in | Wt 145.6 lb

## 2019-11-27 DIAGNOSIS — E0520 Thyrotoxicosis with toxic multinodular goiter without thyrotoxic crisis or storm: Secondary | ICD-10-CM | POA: Diagnosis not present

## 2019-11-27 NOTE — Progress Notes (Signed)
11/27/2019, 11:31 AM        Endocrinology follow-up note    Subjective:    Patient ID: Andrea Shannon, female    DOB: 1945/11/25, PCP Monico Blitz, MD   Past Medical History:  Diagnosis Date  . Diabetes mellitus, type II ([place])   . Hyperthyroidism    Past Surgical History:  Procedure Laterality Date  . ABDOMINAL HYSTERECTOMY     Social History   Socioeconomic History  . Marital status: Widowed    Spouse name: Not on file  . Number of children: Not on file  . Years of education: Not on file  . Highest education level: Not on file  Occupational History  . Not on file  Tobacco Use  . Smoking status: Never Smoker  . Smokeless tobacco: Never Used  Substance and Sexual Activity  . Alcohol use: Not Currently  . Drug use: Never  . Sexual activity: Not on file  Other Topics Concern  . Not on file  Social History Narrative  . Not on file   Social Determinants of Health   Financial Resource Strain:   . Difficulty of Paying Living Expenses: Not on file  Food Insecurity:   . Worried About Charity fundraiser in the Last Year: Not on file  . Ran Out of Food in the Last Year: Not on file  Transportation Needs:   . Lack of Transportation (Medical): Not on file  . Lack of Transportation (Non-Medical): Not on file  Physical Activity:   . Days of Exercise per Week: Not on file  . Minutes of Exercise per Session: Not on file  Stress:   . Feeling of Stress : Not on file  Social Connections:   . Frequency of Communication with Friends and Family: Not on file  . Frequency of Social Gatherings with Friends and Family: Not on file  . Attends Religious Services: Not on file  . Active Member of Clubs or Organizations: Not on file  . Attends Archivist Meetings: Not on file  . Marital Status: Not on file   Outpatient Encounter Medications as of 11/27/2019  Medication Sig  . aspirin 81 MG chewable tablet Chew by  mouth daily.  . diclofenac (VOLTAREN) 75 MG EC tablet Take 75 mg by mouth daily.  Marland Kitchen lisinopril-hydrochlorothiazide (PRINZIDE,ZESTORETIC) 20-12.5 MG tablet Take 1 tablet by mouth daily.  Marland Kitchen omeprazole (PRILOSEC) 20 MG capsule Take 20 mg by mouth daily.  . [DISCONTINUED] metFORMIN (GLUCOPHAGE) 500 MG tablet Take by mouth daily with breakfast.    No facility-administered encounter medications on file as of 11/27/2019.   ALLERGIES: No Known Allergies  VACCINATION STATUS:  There is no immunization history on file for this patient.  HPI Andrea Shannon is 74 y.o. female who presents today with a medical history as above. -She was treated with I-131 on September 29, 2019 for toxic multinodular goiter.   she is also status post  fine-needle aspiration of her thyroid nodule, with benign findings.  She is here with new set of thyroid function tests consistent with treatment effect.  She is not hypothyroid yet.  She does not have any new complaints today.   Her previous symptoms have largely resolved, and steady  weight since last visit.    She reports family history of thyroid dysfunction, family history of thyroid malignancy.  Review of systems: Limited as above.  Objective:    BP 131/69   Pulse 93   Ht 5\' 3"  (1.6 m)   Wt 145 lb 9.6 oz (66 kg)   BMI 25.79 kg/m   Wt Readings from Last 3 Encounters:  11/27/19 145 lb 9.6 oz (66 kg)  04/18/19 144 lb (65.3 kg)  12/06/18 147 lb (66.7 kg)       Ultrasound of the thyroid on June 27, 2018 showed right lobe measuring 4.9 x 1.9 x 2.1 cm with 1 dominant mixed cystic and solid nodule measuring 2.2 x 1.5 x 1.8 cm.  Another nodule in the superior right lobe measuring 1.9 x 0.9 x 1.2 cm. Left lobe heterogeneous measuring 4.7 x 3.2 x 2.3 cm with dominant mixed cystic and solid nodule in the central left lobe measuring 2.6 x 2.4 x 2.3 cm.  Separate mixed cystic and solid nodule in the superior left lobe measuring 1.7 x 1.3 x 1.4 cm. Impression:   innumerable thyroid nodules, increasing probability of benignity.  2 suspicious right thyroid nodule measuring 1.9 x 0.9 x 1.2 cm in the superior right lobe.  Ultrasound-guided FNA is suggested.  More suspicious left thyroid nodule in the superior left lobe measuring 2.6 x 2.4 x 2.3 cm.  Ultrasound-guided FNA suggested.  Recent Results (from the past 2160 hour(s))  TSH     Status: Abnormal   Collection Time: 11/20/19 12:00 AM  Result Value Ref Range   TSH 0.01 (A) 0.41 - 5.90    Comment: free t4 1.26    Fine-needle aspiration on November 30, 2018 revealed atypia of undetermined significance,  afirma testing-reported benign, with malignancy risk less than 4%.    January 10, 2019 labs: TSH 0.23, free T4 0.93, free T3 5.5.  Thyroid uptake and scan on September 04, 2019 showed enlarged thyroid gland with several focal areas of increased uptake, likely representing nodules.  No areas of photopenia.  24-hour uptake 35%.  I-131 thyroid ablative therapy on September 29, 2019.  Assessment & Plan:   1. Toxic multinodul goiter -She status post I-131 thyroid ablation on September 29, 2019.  Her previsit labs are consistent with treatment effect, no hypothyroidism yet.    She will not be initiated on thyroid hormone replacement at this time.  We will return with repeat thyroid function test in 9 weeks.    2.  Multinodular goiter -Her fine-needle aspiration was significant for atypia of undetermined significance, a sample was sent for afirma testing-with grossly benign finding with malignancy is less than 4%.  She will not need surgical intervention at this time.     - I advised her  to maintain close follow up with Monico Blitz, MD for primary care needs.      - Time spent on this patient care encounter:  25 minutes of which 50% was spent in  counseling and the rest reviewing  her current and  previous labs / studies and medications  doses and developing a plan for long term care. Eli Phillips   participated in the discussions, expressed understanding, and voiced agreement with the above plans.  All questions were answered to her satisfaction. she is encouraged to contact clinic should she have any questions or concerns prior to her return visit.   Follow up plan: Return in about 9 weeks (around 01/29/2020) for Follow up with Pre-visit Labs.  Glade Lloyd, MD [facility] [address] [place], [place] [address] Phone: [phone]  Fax: [phone]     11/27/2019, 11:31 AM  This note was partially dictated with voice recognition software. Similar sounding words can be transcribed inadequately or may not  be corrected upon review.

## 2019-12-01 DIAGNOSIS — M818 Other osteoporosis without current pathological fracture: Secondary | ICD-10-CM | POA: Diagnosis not present

## 2019-12-01 DIAGNOSIS — E2839 Other primary ovarian failure: Secondary | ICD-10-CM | POA: Diagnosis not present

## 2019-12-12 DIAGNOSIS — I10 Essential (primary) hypertension: Secondary | ICD-10-CM | POA: Diagnosis not present

## 2019-12-12 DIAGNOSIS — Z6826 Body mass index (BMI) 26.0-26.9, adult: Secondary | ICD-10-CM | POA: Diagnosis not present

## 2019-12-12 DIAGNOSIS — Z299 Encounter for prophylactic measures, unspecified: Secondary | ICD-10-CM | POA: Diagnosis not present

## 2019-12-12 DIAGNOSIS — E0590 Thyrotoxicosis, unspecified without thyrotoxic crisis or storm: Secondary | ICD-10-CM | POA: Diagnosis not present

## 2019-12-12 DIAGNOSIS — E1165 Type 2 diabetes mellitus with hyperglycemia: Secondary | ICD-10-CM | POA: Diagnosis not present

## 2019-12-25 DIAGNOSIS — H25813 Combined forms of age-related cataract, bilateral: Secondary | ICD-10-CM | POA: Diagnosis not present

## 2019-12-25 DIAGNOSIS — H401131 Primary open-angle glaucoma, bilateral, mild stage: Secondary | ICD-10-CM | POA: Diagnosis not present

## 2020-01-17 ENCOUNTER — Ambulatory Visit: Admitting: Urology

## 2020-01-22 DIAGNOSIS — E0520 Thyrotoxicosis with toxic multinodular goiter without thyrotoxic crisis or storm: Secondary | ICD-10-CM | POA: Diagnosis not present

## 2020-01-22 DIAGNOSIS — E1142 Type 2 diabetes mellitus with diabetic polyneuropathy: Secondary | ICD-10-CM | POA: Diagnosis not present

## 2020-01-22 DIAGNOSIS — I10 Essential (primary) hypertension: Secondary | ICD-10-CM | POA: Diagnosis not present

## 2020-01-22 DIAGNOSIS — Z299 Encounter for prophylactic measures, unspecified: Secondary | ICD-10-CM | POA: Diagnosis not present

## 2020-01-22 DIAGNOSIS — Z789 Other specified health status: Secondary | ICD-10-CM | POA: Diagnosis not present

## 2020-01-22 DIAGNOSIS — M79603 Pain in arm, unspecified: Secondary | ICD-10-CM | POA: Diagnosis not present

## 2020-01-22 DIAGNOSIS — Z6826 Body mass index (BMI) 26.0-26.9, adult: Secondary | ICD-10-CM | POA: Diagnosis not present

## 2020-01-22 DIAGNOSIS — E1165 Type 2 diabetes mellitus with hyperglycemia: Secondary | ICD-10-CM | POA: Diagnosis not present

## 2020-01-22 LAB — TSH: TSH: 0.04 — AB (ref 0.41–5.90)

## 2020-01-23 DIAGNOSIS — Z1231 Encounter for screening mammogram for malignant neoplasm of breast: Secondary | ICD-10-CM | POA: Diagnosis not present

## 2020-01-29 ENCOUNTER — Other Ambulatory Visit: Payer: Self-pay

## 2020-01-29 ENCOUNTER — Ambulatory Visit (INDEPENDENT_AMBULATORY_CARE_PROVIDER_SITE_OTHER): Payer: Medicare Other | Admitting: "Endocrinology

## 2020-01-29 ENCOUNTER — Encounter: Payer: Self-pay | Admitting: "Endocrinology

## 2020-01-29 VITALS — BP 136/71 | HR 84 | Ht 63.0 in | Wt 150.0 lb

## 2020-01-29 DIAGNOSIS — E0520 Thyrotoxicosis with toxic multinodular goiter without thyrotoxic crisis or storm: Secondary | ICD-10-CM | POA: Diagnosis not present

## 2020-01-29 NOTE — Progress Notes (Signed)
01/29/2020, 7:08 PM      Endocrinology follow-up note    Subjective:    Patient ID: Andrea Shannon, female    DOB: [birth year], PCP Monico Blitz, MD   Past Medical History:  Diagnosis Date  . Diabetes mellitus, type II ([place])   . Hyperthyroidism    Past Surgical History:  Procedure Laterality Date  . ABDOMINAL HYSTERECTOMY     Social History   Socioeconomic History  . Marital status: Widowed    Spouse name: Not on file  . Number of children: Not on file  . Years of education: Not on file  . Highest education level: Not on file  Occupational History  . Not on file  Tobacco Use  . Smoking status: Never Smoker  . Smokeless tobacco: Never Used  Substance and Sexual Activity  . Alcohol use: Not Currently  . Drug use: Never  . Sexual activity: Not on file  Other Topics Concern  . Not on file  Social History Narrative  . Not on file   Social Determinants of Health   Financial Resource Strain:   . Difficulty of Paying Living Expenses:   Food Insecurity:   . Worried About Charity fundraiser in the Last Year:   . Arboriculturist in the Last Year:   Transportation Needs:   . Film/video editor (Medical):   Marland Kitchen Lack of Transportation (Non-Medical):   Physical Activity:   . Days of Exercise per Week:   . Minutes of Exercise per Session:   Stress:   . Feeling of Stress :   Social Connections:   . Frequency of Communication with Friends and Family:   . Frequency of Social Gatherings with Friends and Family:   . Attends Religious Services:   . Active Member of Clubs or Organizations:   . Attends Archivist Meetings:   Marland Kitchen Marital Status:    Outpatient Encounter Medications as of 01/29/2020  Medication Sig  . aspirin 81 MG chewable tablet Chew by mouth daily.  . diclofenac (VOLTAREN) 75 MG EC tablet Take 75 mg by mouth daily.  Marland Kitchen lisinopril-hydrochlorothiazide (PRINZIDE,ZESTORETIC) 20-12.5 MG tablet  Take 1 tablet by mouth daily.  Marland Kitchen omeprazole (PRILOSEC) 20 MG capsule Take 20 mg by mouth daily.   No facility-administered encounter medications on file as of 01/29/2020.   ALLERGIES: No Known Allergies  VACCINATION STATUS:  There is no immunization history on file for this patient.  HPI Andrea Shannon is 74 y.o. female who presents today with a medical history as above. -She was treated with I-131 on September 29, 2019 for toxic multinodular goiter.   she is also status post  fine-needle aspiration of her thyroid nodule, with benign findings.  She is here with a new set of thyroid function tests consistent with treatment effect, however not hypothyroid yet.    She has gained approximately 5 pounds since last visit, she has no new complaints.   Her previous symptoms have largely resolved, and steady weight since last visit.    She reports family history of thyroid dysfunction, family history of thyroid malignancy.  Review of systems: Limited as above.  Objective:    BP 136/71   Pulse 84  Ht 5\' 3"  (1.6 m)   Wt 150 lb (68 kg)   BMI 26.57 kg/m   Wt Readings from Last 3 Encounters:  01/29/20 150 lb (68 kg)  11/27/19 145 lb 9.6 oz (66 kg)  04/18/19 144 lb (65.3 kg)       Ultrasound of the thyroid on June 27, 2018 showed right lobe measuring 4.9 x 1.9 x 2.1 cm with 1 dominant mixed cystic and solid nodule measuring 2.2 x 1.5 x 1.8 cm.  Another nodule in the superior right lobe measuring 1.9 x 0.9 x 1.2 cm. Left lobe heterogeneous measuring 4.7 x 3.2 x 2.3 cm with dominant mixed cystic and solid nodule in the central left lobe measuring 2.6 x 2.4 x 2.3 cm.  Separate mixed cystic and solid nodule in the superior left lobe measuring 1.7 x 1.3 x 1.4 cm. Impression:  innumerable thyroid nodules, increasing probability of benignity.  2 suspicious right thyroid nodule measuring 1.9 x 0.9 x 1.2 cm in the superior right lobe.  Ultrasound-guided FNA is suggested.  More suspicious left  thyroid nodule in the superior left lobe measuring 2.6 x 2.4 x 2.3 cm.  Ultrasound-guided FNA suggested.  Recent Results (from the past 2160 hour(s))  TSH     Status: Abnormal   Collection Time: 11/20/19 12:00 AM  Result Value Ref Range   TSH 0.01 (A) 0.41 - 5.90    Comment: free t4 1.26  TSH     Status: Abnormal   Collection Time: 01/22/20 12:00 AM  Result Value Ref Range   TSH 0.04 (A) 0.41 - 5.90    Comment: free t4 0.85    Fine-needle aspiration on November 30, 2018 revealed atypia of undetermined significance,  afirma testing-reported benign, with malignancy risk less than 4%.    January 10, 2019 labs: TSH 0.23, free T4 0.93, free T3 5.5.  Thyroid uptake and scan on September 04, 2019 showed enlarged thyroid gland with several focal areas of increased uptake, likely representing nodules.  No areas of photopenia.  24-hour uptake 35%.  I-131 thyroid ablative therapy on September 29, 2019.  Assessment & Plan:   1. Toxic multinodul goiter -She status post I-131 thyroid ablation on September 29, 2019.  Her previsit labs are consistent with treatment effect, no hypothyroidism yet.  This is unlikely the case of treatment failure, and likely to be slow/late responder.  She would not be initiated on thyroid hormone replacement.  She will have repeat thyroid function test and office visit in 10 weeks.    2.  Multinodular goiter -Her fine-needle aspiration was significant for atypia of undetermined significance, a sample was sent for afirma testing-with grossly benign finding with malignancy is less than 4%.  She will not need surgical intervention at this time.     - I advised her  to maintain close follow up with Monico Blitz, MD for primary care needs.       - Time spent on this patient care encounter:  25 minutes of which 50% was spent in  counseling and the rest reviewing  her current and  previous labs / studies and medications  doses and developing a plan for long term care. Eli Phillips  participated in the discussions, expressed understanding, and voiced agreement with the above plans.  All questions were answered to her satisfaction. she is encouraged to contact clinic should she have any questions or concerns prior to her return visit.   Follow up plan: Return in about 10 weeks (around  04/08/2020) for Follow up with Pre-visit Labs.   Glade Lloyd, MD [facility] [address] [place], [place] [address] Phone: [phone]  Fax: [phone]     01/29/2020, 7:08 PM  This note was partially dictated with voice recognition software. Similar sounding words can be transcribed inadequately or may not  be corrected upon review.

## 2020-02-26 ENCOUNTER — Ambulatory Visit (INDEPENDENT_AMBULATORY_CARE_PROVIDER_SITE_OTHER): Payer: Medicare Other | Admitting: Urology

## 2020-02-26 ENCOUNTER — Encounter: Payer: Self-pay | Admitting: Urology

## 2020-02-26 ENCOUNTER — Other Ambulatory Visit: Payer: Self-pay

## 2020-02-26 VITALS — BP 132/67 | HR 86 | Temp 96.8°F | Ht 63.0 in | Wt 145.0 lb

## 2020-02-26 DIAGNOSIS — R300 Dysuria: Secondary | ICD-10-CM | POA: Insufficient documentation

## 2020-02-26 DIAGNOSIS — R3915 Urgency of urination: Secondary | ICD-10-CM | POA: Diagnosis not present

## 2020-02-26 LAB — POCT URINALYSIS DIPSTICK
Bilirubin, UA: NEGATIVE
Blood, UA: NEGATIVE
Glucose, UA: NEGATIVE
Ketones, UA: NEGATIVE
Leukocytes, UA: NEGATIVE
Nitrite, UA: NEGATIVE
Protein, UA: NEGATIVE
Spec Grav, UA: <=1.005 — AB (ref 1.010–1.025)
Urobilinogen, UA: 0.2 E.U./dL
pH, UA: 5 (ref 5.0–8.0)

## 2020-02-26 LAB — BLADDER SCAN AMB NON-IMAGING: Scan Result: 6.2

## 2020-02-26 NOTE — Patient Instructions (Signed)

## 2020-02-26 NOTE — Progress Notes (Signed)
02/26/2020 1:59 PM   Andrea Shannon 10-04-1946 [id number]  Referring provider: Monico Blitz, [address] [place],  [place] [address]  Urinary urgency  HPI: Andrea Shannon is a 74yo here for evaluation of urinary urgency and suprapubic pain. She was previously seen by Dr. Exie Parody 3-4 years ago for urinary urgency and she was placed on a medication which did improve her urinary urgency and suprapubic pressure. UA today is normal. She denies dysuria, hematuria, nocturia or incomplete emptying She denies urge incontinence.    PMH: Past Medical History:  Diagnosis Date  . Acid reflux   . Arthritis   . Diabetes mellitus, type II ([place])   . Hypertension   . Hyperthyroidism   . Hypothyroidism     Surgical History: Past Surgical History:  Procedure Laterality Date  . ABDOMINAL HYSTERECTOMY      Home Medications:  Allergies as of 02/26/2020   No Known Allergies     Medication List       Accurate as of Feb 26, 2020  1:59 PM. If you have any questions, ask your nurse or doctor.        aspirin 81 MG chewable tablet Chew by mouth daily.   diclofenac 75 MG EC tablet Commonly known as: VOLTAREN Take 75 mg by mouth daily.   lisinopril-hydrochlorothiazide 20-12.5 MG tablet Commonly known as: ZESTORETIC Take 1 tablet by mouth daily.   omeprazole 20 MG capsule Commonly known as: PRILOSEC Take 20 mg by mouth daily.       Allergies: No Known Allergies  Family History: Family History  Problem Relation Age of Onset  . Diabetes Mother     Social History:  reports that she has never smoked. She has never used smokeless tobacco. She reports previous alcohol use. She reports that she does not use drugs.  ROS: All other review of systems were reviewed and are negative except what is noted above in HPI  Physical Exam: BP 132/67   Pulse 86   Temp (!) 96.8 F (36 C)   Ht 5\' 3"  (1.6 m)   Wt 145 lb (65.8 kg)   BMI 25.69 kg/m   Constitutional:  Alert and oriented, No acute  distress. HEENT:  AT, moist mucus membranes.  Trachea midline, no masses. Cardiovascular: No clubbing, cyanosis, or edema. Respiratory: Normal respiratory effort, no increased work of breathing. GI: Abdomen is soft, nontender, nondistended, no abdominal masses GU: No CVA tenderness.  Lymph: No cervical or inguinal lymphadenopathy. Skin: No rashes, bruises or suspicious lesions. Neurologic: Grossly intact, no focal deficits, moving all 4 extremities. Psychiatric: Normal mood and affect.  Laboratory Data: No results found for: WBC, HGB, HCT, MCV, PLT  No results found for: CREATININE  No results found for: PSA  No results found for: TESTOSTERONE  No results found for: HGBA1C  Urinalysis    Component Value Date/Time   BILIRUBINUR neg 02/26/2020 1348   PROTEINUR Negative 02/26/2020 1348   UROBILINOGEN 0.2 02/26/2020 1348   NITRITE neg 02/26/2020 1348   LEUKOCYTESUR Negative 02/26/2020 1348    No results found for: LABMICR, [place], RBCUA, LABEPIT, MUCUS, BACTERIA  Pertinent Imaging:  No results found for this or any previous visit. No results found for this or any previous visit. No results found for this or any previous visit. No results found for this or any previous visit. No results found for this or any previous visit. No results found for this or any previous visit. No results found for this or any previous visit.  No results found for this or any previous visit.  Assessment & Plan:    1. Urinary urgency We will trial Gemtesa 75mg  -RTC 4-6 weeks   No follow-ups on file.  Nicolette Bang, MD  [facility] [place]

## 2020-02-26 NOTE — Progress Notes (Signed)
Urological Symptom Review  Patient is experiencing the following symptoms: Hard to postpone urination Burning/pain with urination   Review of Systems  Gastrointestinal (upper)  : Negative for upper GI symptoms  Gastrointestinal (lower) : Negative for lower GI symptoms  Constitutional : Negative for symptoms  Skin: Negative for skin symptoms  Eyes: Negative for eye symptoms  Ear/Nose/Throat : Negative for Ear/Nose/Throat symptoms  Hematologic/Lymphatic: Negative for Hematologic/Lymphatic symptoms  Cardiovascular : Negative for cardiovascular symptoms  Respiratory : Negative for Respiratory symptoms  Endocrine: Negative for endocrine symptoms  Musculoskeletal: Negative for musculoskeletal symptoms  Neurological: Negative for neurological symptoms  Psychologic: Negative for psychiatric symptoms

## 2020-04-09 ENCOUNTER — Ambulatory Visit (INDEPENDENT_AMBULATORY_CARE_PROVIDER_SITE_OTHER): Payer: Medicare Other | Admitting: "Endocrinology

## 2020-04-09 ENCOUNTER — Encounter: Payer: Self-pay | Admitting: "Endocrinology

## 2020-04-09 ENCOUNTER — Other Ambulatory Visit: Payer: Self-pay

## 2020-04-09 VITALS — BP 120/78 | HR 96 | Ht 63.0 in | Wt 153.4 lb

## 2020-04-09 DIAGNOSIS — E890 Postprocedural hypothyroidism: Secondary | ICD-10-CM

## 2020-04-09 MED ORDER — LEVOTHYROXINE SODIUM 50 MCG PO TABS: 50.0000 ug | ORAL_TABLET | Freq: Every day | ORAL | 0 refills | Status: DC

## 2020-04-09 NOTE — Progress Notes (Signed)
04/09/2020, 2:03 PM     Endocrinology follow-up note   Subjective:    Patient ID: Andrea Shannon, female    DOB: 18-May-1946, PCP Monico Blitz, MD   Past Medical History:  Diagnosis Date  . Acid reflux   . Arthritis   . Diabetes mellitus, type II ([place])   . Hypertension   . Hyperthyroidism   . Hypothyroidism    Past Surgical History:  Procedure Laterality Date  . ABDOMINAL HYSTERECTOMY     Social History   Socioeconomic History  . Marital status: Widowed    Spouse name: Not on file  . Number of children: 1  . Years of education: Not on file  . Highest education level: Not on file  Occupational History  . Not on file  Tobacco Use  . Smoking status: Never Smoker  . Smokeless tobacco: Never Used  Vaping Use  . Vaping Use: Never used  Substance and Sexual Activity  . Alcohol use: Not Currently  . Drug use: Never  . Sexual activity: Not on file  Other Topics Concern  . Not on file  Social History Narrative  . Not on file   Social Determinants of Health   Financial Resource Strain:   . Difficulty of Paying Living Expenses:   Food Insecurity:   . Worried About Charity fundraiser in the Last Year:   . Arboriculturist in the Last Year:   Transportation Needs:   . Film/video editor (Medical):   Marland Kitchen Lack of Transportation (Non-Medical):   Physical Activity:   . Days of Exercise per Week:   . Minutes of Exercise per Session:   Stress:   . Feeling of Stress :   Social Connections:   . Frequency of Communication with Friends and Family:   . Frequency of Social Gatherings with Friends and Family:   . Attends Religious Services:   . Active Member of Clubs or Organizations:   . Attends Archivist Meetings:   Marland Kitchen Marital Status:    Outpatient Encounter Medications as of 04/09/2020  Medication Sig  . aspirin 81 MG chewable tablet Chew by mouth daily.  . diclofenac (VOLTAREN) 75 MG EC tablet Take 75  mg by mouth daily.  Marland Kitchen lisinopril-hydrochlorothiazide (PRINZIDE,ZESTORETIC) 20-12.5 MG tablet Take 1 tablet by mouth daily.  Marland Kitchen omeprazole (PRILOSEC) 20 MG capsule Take 20 mg by mouth daily.  Marland Kitchen levothyroxine (SYNTHROID) 50 MCG tablet Take 1 tablet (50 mcg total) by mouth daily before breakfast.  . [DISCONTINUED] levothyroxine (SYNTHROID) 50 MCG tablet Take 1 tablet (50 mcg total) by mouth daily before breakfast.   No facility-administered encounter medications on file as of 04/09/2020.   ALLERGIES: No Known Allergies  VACCINATION STATUS:  There is no immunization history on file for this patient.  HPI Andrea Shannon is 74 y.o. female who presents today with a medical history as above. -She was treated with I-131 on September 29, 2019 for toxic multinodular goiter.   she is also status post  fine-needle aspiration of her thyroid nodule, with benign findings.  She is here with a new set of thyroid function tests.  She has no new complaints today.  She has gained 8 pounds since  last visit.  Her previous symptoms have largely resolved, and steady weight since last visit.    She reports family history of thyroid dysfunction, family history of thyroid malignancy.  Review of systems: Limited as above.  Objective:    BP 120/78 (BP Location: Right Arm, Patient Position: Sitting)   Pulse 96   Ht 5\' 3"  (1.6 m)   Wt 153 lb 6.4 oz (69.6 kg)   BMI 27.17 kg/m   Wt Readings from Last 3 Encounters:  04/09/20 153 lb 6.4 oz (69.6 kg)  02/26/20 145 lb (65.8 kg)  01/29/20 150 lb (68 kg)       Ultrasound of the thyroid on June 27, 2018 showed right lobe measuring 4.9 x 1.9 x 2.1 cm with 1 dominant mixed cystic and solid nodule measuring 2.2 x 1.5 x 1.8 cm.  Another nodule in the superior right lobe measuring 1.9 x 0.9 x 1.2 cm. Left lobe heterogeneous measuring 4.7 x 3.2 x 2.3 cm with dominant mixed cystic and solid nodule in the central left lobe measuring 2.6 x 2.4 x 2.3 cm.  Separate mixed  cystic and solid nodule in the superior left lobe measuring 1.7 x 1.3 x 1.4 cm. Impression:  innumerable thyroid nodules, increasing probability of benignity.  2 suspicious right thyroid nodule measuring 1.9 x 0.9 x 1.2 cm in the superior right lobe.  Ultrasound-guided FNA is suggested.  More suspicious left thyroid nodule in the superior left lobe measuring 2.6 x 2.4 x 2.3 cm.  Ultrasound-guided FNA suggested.  Recent Results (from the past 2160 hour(s))  TSH     Status: Abnormal   Collection Time: 01/22/20 12:00 AM  Result Value Ref Range   TSH 0.04 (A) 0.41 - 5.90    Comment: free t4 0.85  POCT urinalysis dipstick     Status: Abnormal   Collection Time: 02/26/20  1:48 PM  Result Value Ref Range   Color, UA yellow    Clarity, UA     Glucose, UA Negative Negative   Bilirubin, UA neg    Ketones, UA neg    Spec Grav, UA <=1.005 (A) 1.010 - 1.025   Blood, UA neg    pH, UA 5.0 5.0 - 8.0   Protein, UA Negative Negative   Urobilinogen, UA 0.2 0.2 or 1.0 E.U./dL   Nitrite, UA neg    Leukocytes, UA Negative Negative   Appearance clear    Odor    BLADDER SCAN AMB NON-IMAGING     Status: None   Collection Time: 02/26/20  1:50 PM  Result Value Ref Range   Scan Result 6.2     Fine-needle aspiration on November 30, 2018 revealed atypia of undetermined significance,  afirma testing-reported benign, with malignancy risk less than 4%.    January 10, 2019 labs: TSH 0.23, free T4 0.93, free T3 5.5.  Thyroid uptake and scan on September 04, 2019 showed enlarged thyroid gland with several focal areas of increased uptake, likely representing nodules.  No areas of photopenia.  24-hour uptake 35%.  I-131 thyroid ablative therapy on September 29, 2019.  Assessment & Plan:   1. RAI - induced hypothyroidism  -She status post I-131 thyroid ablation on September 29, 2019.  Her previsit labs are consistent with treatment effect with  hypothyroidism . -She is ready for thyroid hormone replacement.  I  discussed and initiated levothyroxine 50 mcg p.o. daily before breakfast.   - We discussed about the correct intake of her thyroid hormone, on empty stomach at  fasting, with water, separated by at least 30 minutes from breakfast and other medications,  and separated by more than 4 hours from calcium, iron, multivitamins, acid reflux medications (PPIs). -Patient is made aware of the fact that thyroid hormone replacement is needed for life, dose to be adjusted by periodic monitoring of thyroid function tests.   2.  Multinodular goiter -Her fine-needle aspiration was significant for  atypia of undetermined significance, a sample was sent for afirma testing-with grossly benign finding with malignancy is less than 4%.  She will not need surgical intervention at this time.     - I advised her  to maintain close follow up with Monico Blitz, MD for primary care needs.      - Time spent on this patient care encounter:  20 minutes of which 50% was spent in  counseling and the rest reviewing  her current and  previous labs / studies and medications  doses and developing a plan for long term care. Eli Phillips  participated in the discussions, expressed understanding, and voiced agreement with the above plans.  All questions were answered to her satisfaction. she is encouraged to contact clinic should she have any questions or concerns prior to her return visit.    Follow up plan: Return in about 4 months (around 08/09/2020) for F/U with Pre-visit Labs.   Glade Lloyd, MD [facility] [address] [place], [place] [address] Phone: [phone]  Fax: [phone]     04/09/2020, 2:03 PM  This note was partially dictated with voice recognition software. Similar sounding words can be transcribed inadequately or may not  be corrected upon review.

## 2020-04-15 ENCOUNTER — Other Ambulatory Visit: Payer: Self-pay

## 2020-04-15 ENCOUNTER — Ambulatory Visit (INDEPENDENT_AMBULATORY_CARE_PROVIDER_SITE_OTHER): Payer: Medicare Other | Admitting: Urology

## 2020-04-15 ENCOUNTER — Encounter: Payer: Self-pay | Admitting: Urology

## 2020-04-15 VITALS — BP 129/71 | HR 96 | Temp 98.1°F | Ht 63.0 in | Wt 153.0 lb

## 2020-04-15 DIAGNOSIS — N3281 Overactive bladder: Secondary | ICD-10-CM | POA: Diagnosis not present

## 2020-04-15 DIAGNOSIS — R300 Dysuria: Secondary | ICD-10-CM | POA: Diagnosis not present

## 2020-04-15 LAB — POCT URINALYSIS DIPSTICK
Bilirubin, UA: NEGATIVE
Blood, UA: NEGATIVE
Glucose, UA: NEGATIVE
Ketones, UA: NEGATIVE
Leukocytes, UA: NEGATIVE
Nitrite, UA: NEGATIVE
Protein, UA: NEGATIVE
Spec Grav, UA: <=1.005 — AB (ref 1.010–1.025)
Urobilinogen, UA: NEGATIVE E.U./dL — AB
pH, UA: 5 (ref 5.0–8.0)

## 2020-04-15 LAB — BLADDER SCAN AMB NON-IMAGING: Scan Result: 51.3

## 2020-04-15 MED ORDER — MIRABEGRON ER 25 MG PO TB24: 25.0000 mg | ORAL_TABLET | Freq: Every day | ORAL | 3 refills | Status: DC

## 2020-04-15 NOTE — Progress Notes (Signed)
Urological Symptom Review  Patient is experiencing the following symptoms: None   Review of Systems  Gastrointestinal (upper)  : Negative for upper GI symptoms  Gastrointestinal (lower) : Negative for lower GI symptoms  Constitutional : Night Sweats  Skin: Negative for skin symptoms  Eyes: Negative for eye symptoms  Ear/Nose/Throat : Sinus problems  Hematologic/Lymphatic: Negative for Hematologic/Lymphatic symptoms  Cardiovascular : Negative for cardiovascular symptoms  Respiratory : Negative for respiratory symptoms  Endocrine: Negative for endocrine symptoms  Musculoskeletal: Joint pain  Neurological: Negative for neurological symptoms  Psychologic: Negative for psychiatric symptoms

## 2020-04-15 NOTE — Progress Notes (Signed)
04/15/2020 3:07 PM   [place] Jaci Standard [birth year] [id number]  Referring provider: Monico Blitz, [place],  [place] [address]  Urinary urgency  HPI: Ms Bently is a 74yo here for followup for dysuria and OAB. She was given Logan Bores last visit which significantly improved her urgency and resolved her dysuria. She ran out of the medication and her LUTS worsened again. NO nocturia. Stream strong.    PMH: Past Medical History:  Diagnosis Date  . Acid reflux   . Arthritis   . Diabetes mellitus, type II ([place])   . Hypertension   . Hyperthyroidism   . Hypothyroidism     Surgical History: Past Surgical History:  Procedure Laterality Date  . ABDOMINAL HYSTERECTOMY      Home Medications:  Allergies as of 04/15/2020   No Known Allergies     Medication List       Accurate as of April 15, 2020  3:07 PM. If you have any questions, ask your nurse or doctor.        aspirin 81 MG chewable tablet Chew by mouth daily.   diclofenac 75 MG EC tablet Commonly known as: VOLTAREN Take 75 mg by mouth daily.   Gemtesa 75 MG Tabs Generic drug: Vibegron Take by mouth.   levothyroxine 50 MCG tablet Commonly known as: SYNTHROID Take 1 tablet (50 mcg total) by mouth daily before breakfast.   lisinopril-hydrochlorothiazide 20-12.5 MG tablet Commonly known as: ZESTORETIC Take 1 tablet by mouth daily.   omeprazole 20 MG capsule Commonly known as: PRILOSEC Take 20 mg by mouth daily.       Allergies: No Known Allergies  Family History: Family History  Problem Relation Age of Onset  . Diabetes Mother     Social History:  reports that she has never smoked. She has never used smokeless tobacco. She reports previous alcohol use. She reports that she does not use drugs.  ROS: All other review of systems were reviewed and are negative except what is noted above in HPI  Physical Exam: BP 129/71   Pulse 96   Temp 98.1 F (36.7 C)   Ht 5\' 3"  (1.6 m)   Wt 153 lb (69.4 kg)    BMI 27.10 kg/m   Constitutional:  Alert and oriented, No acute distress. HEENT: [place] AT, moist mucus membranes.  Trachea midline, no masses. Cardiovascular: No clubbing, cyanosis, or edema. Respiratory: Normal respiratory effort, no increased work of breathing. GI: Abdomen is soft, nontender, nondistended, no abdominal masses GU: No CVA tenderness.  Lymph: No cervical or inguinal lymphadenopathy. Skin: No rashes, bruises or suspicious lesions. Neurologic: Grossly intact, no focal deficits, moving all 4 extremities. Psychiatric: Normal mood and affect.  Laboratory Data: No results found for: WBC, HGB, HCT, MCV, PLT  No results found for: CREATININE  No results found for: PSA  No results found for: TESTOSTERONE  No results found for: HGBA1C  Urinalysis    Component Value Date/Time   BILIRUBINUR neg 02/26/2020 1348   PROTEINUR Negative 02/26/2020 1348   UROBILINOGEN 0.2 02/26/2020 1348   NITRITE neg 02/26/2020 1348   LEUKOCYTESUR Negative 02/26/2020 1348    No results found for: LABMICR, [place], RBCUA, LABEPIT, MUCUS, BACTERIA  Pertinent Imaging:  No results found for this or any previous visit.  No results found for this or any previous visit.  No results found for this or any previous visit.  No results found for this or any previous visit.  No results found for this or any previous  visit.  No results found for this or any previous visit.  No results found for this or any previous visit.  No results found for this or any previous visit.   Assessment & Plan:    1. Dysuria -resolved - Bladder Scan (Post Void Residual) in office - POCT urinalysis dipstick  2. OAB (overactive bladder) -we will trial mirabegron 25mg  daily. If this improves her OAb symptoms I will see her back in 3 months.    No follow-ups on file.  Nicolette Bang, MD  [facility] [place]

## 2020-04-15 NOTE — Patient Instructions (Signed)

## 2020-04-17 ENCOUNTER — Ambulatory Visit: Admitting: Urology

## 2020-04-29 ENCOUNTER — Telehealth: Payer: Self-pay | Admitting: Urology

## 2020-04-29 DIAGNOSIS — E1165 Type 2 diabetes mellitus with hyperglycemia: Secondary | ICD-10-CM | POA: Diagnosis not present

## 2020-04-29 DIAGNOSIS — M7551 Bursitis of right shoulder: Secondary | ICD-10-CM | POA: Diagnosis not present

## 2020-04-29 DIAGNOSIS — I10 Essential (primary) hypertension: Secondary | ICD-10-CM | POA: Diagnosis not present

## 2020-04-29 DIAGNOSIS — Z6826 Body mass index (BMI) 26.0-26.9, adult: Secondary | ICD-10-CM | POA: Diagnosis not present

## 2020-04-29 DIAGNOSIS — Z299 Encounter for prophylactic measures, unspecified: Secondary | ICD-10-CM | POA: Diagnosis not present

## 2020-04-29 DIAGNOSIS — E1142 Type 2 diabetes mellitus with diabetic polyneuropathy: Secondary | ICD-10-CM | POA: Diagnosis not present

## 2020-04-29 DIAGNOSIS — E039 Hypothyroidism, unspecified: Secondary | ICD-10-CM | POA: Diagnosis not present

## 2020-04-29 NOTE — Telephone Encounter (Signed)
Pt called and stated the samples she was given, she thinks they are working. She also stated that she is not able to afford it and would like to know what her other options are. If it is possible, could she go through [place] to get it cheaper.

## 2020-05-01 ENCOUNTER — Other Ambulatory Visit: Payer: Self-pay

## 2020-05-01 DIAGNOSIS — N3281 Overactive bladder: Secondary | ICD-10-CM

## 2020-05-01 DIAGNOSIS — H401131 Primary open-angle glaucoma, bilateral, mild stage: Secondary | ICD-10-CM | POA: Diagnosis not present

## 2020-05-01 DIAGNOSIS — H2513 Age-related nuclear cataract, bilateral: Secondary | ICD-10-CM | POA: Diagnosis not present

## 2020-05-01 MED ORDER — MIRABEGRON ER 25 MG PO TB24: 25.0000 mg | ORAL_TABLET | Freq: Every day | ORAL | 3 refills | Status: DC

## 2020-05-01 NOTE — Telephone Encounter (Signed)
Prescription sent to [facility] per pt request.

## 2020-05-01 NOTE — Telephone Encounter (Signed)
Left message to return call 

## 2020-07-17 ENCOUNTER — Ambulatory Visit (INDEPENDENT_AMBULATORY_CARE_PROVIDER_SITE_OTHER): Payer: Medicare Other | Admitting: Urology

## 2020-07-17 ENCOUNTER — Encounter: Payer: Self-pay | Admitting: Urology

## 2020-07-17 ENCOUNTER — Other Ambulatory Visit: Payer: Self-pay

## 2020-07-17 VITALS — BP 123/65 | HR 103 | Temp 98.9°F | Ht 63.0 in | Wt 153.0 lb

## 2020-07-17 DIAGNOSIS — N3281 Overactive bladder: Secondary | ICD-10-CM | POA: Diagnosis not present

## 2020-07-17 DIAGNOSIS — R3915 Urgency of urination: Secondary | ICD-10-CM | POA: Diagnosis not present

## 2020-07-17 LAB — URINALYSIS, ROUTINE W REFLEX MICROSCOPIC
Bilirubin, UA: NEGATIVE
Glucose, UA: NEGATIVE
Ketones, UA: NEGATIVE
Leukocytes,UA: NEGATIVE
Nitrite, UA: NEGATIVE
Protein,UA: NEGATIVE
RBC, UA: NEGATIVE
Specific Gravity, UA: 1.01 (ref 1.005–1.030)
Urobilinogen, Ur: 0.2 mg/dL (ref 0.2–1.0)
pH, UA: 5 (ref 5.0–7.5)

## 2020-07-17 NOTE — Patient Instructions (Signed)

## 2020-07-17 NOTE — Progress Notes (Signed)

## 2020-07-17 NOTE — Addendum Note (Signed)
Addended by: Dorisann Frames on: 07/17/2020 01:46 PM   Modules accepted: Orders

## 2020-07-17 NOTE — Progress Notes (Signed)
07/17/2020 12:20 PM   [place] Jaci Standard [birth year] [id number]  Referring provider: Monico Blitz, MD [place],  [place] [address]  Suprapubic pressure and OAB  HPI: Ms Deiter is a 74yo here for followup for suprapubic pressure and OAB. Last visit she was started on Gemtesa and then switched to mirabegron which only slightly improved her suprapubic pressure and urinary urgency. The suprapubic pressure inproved significantly. She denies any incontinence. Nocturia 0-2x.    PMH: Past Medical History:  Diagnosis Date  . Acid reflux   . Arthritis   . Diabetes mellitus, type II ([place])   . Hypertension   . Hyperthyroidism   . Hypothyroidism     Surgical History: Past Surgical History:  Procedure Laterality Date  . ABDOMINAL HYSTERECTOMY      Home Medications:  Allergies as of 07/17/2020   No Known Allergies     Medication List       Accurate as of July 17, 2020 12:20 PM. If you have any questions, ask your nurse or doctor.        aspirin 81 MG chewable tablet Chew by mouth daily.   diclofenac 75 MG EC tablet Commonly known as: VOLTAREN Take 75 mg by mouth daily.   Gemtesa 75 MG Tabs Generic drug: Vibegron Take by mouth.   levothyroxine 50 MCG tablet Commonly known as: SYNTHROID Take 1 tablet (50 mcg total) by mouth daily before breakfast.   lisinopril-hydrochlorothiazide 20-12.5 MG tablet Commonly known as: ZESTORETIC Take 1 tablet by mouth daily.   mirabegron ER 25 MG Tb24 tablet Commonly known as: MYRBETRIQ Take 1 tablet (25 mg total) by mouth daily.   omeprazole 20 MG capsule Commonly known as: PRILOSEC Take 20 mg by mouth daily.       Allergies: No Known Allergies  Family History: Family History  Problem Relation Age of Onset  . Diabetes Mother     Social History:  reports that she has never smoked. She has never used smokeless tobacco. She reports previous alcohol use. She reports that she does not use drugs.  ROS: All other  review of systems were reviewed and are negative except what is noted above in HPI  Physical Exam: BP 123/65   Pulse (!) 103   Temp 98.9 F (37.2 C)   Ht 5\' 3"  (1.6 m)   Wt 153 lb (69.4 kg)   BMI 27.10 kg/m   Constitutional:  Alert and oriented, No acute distress. HEENT: [place] AT, moist mucus membranes.  Trachea midline, no masses. Cardiovascular: No clubbing, cyanosis, or edema. Respiratory: Normal respiratory effort, no increased work of breathing. GI: Abdomen is soft, nontender, nondistended, no abdominal masses GU: No CVA tenderness.  Lymph: No cervical or inguinal lymphadenopathy. Skin: No rashes, bruises or suspicious lesions. Neurologic: Grossly intact, no focal deficits, moving all 4 extremities. Psychiatric: Normal mood and affect.  Laboratory Data: No results found for: WBC, HGB, HCT, MCV, PLT  No results found for: CREATININE  No results found for: PSA  No results found for: TESTOSTERONE  No results found for: HGBA1C  Urinalysis    Component Value Date/Time   BILIRUBINUR neg 04/15/2020 1507   PROTEINUR Negative 04/15/2020 1507   UROBILINOGEN negative (A) 04/15/2020 1507   NITRITE neg 04/15/2020 1507   LEUKOCYTESUR Negative 04/15/2020 1507    No results found for: LABMICR, [place], RBCUA, LABEPIT, MUCUS, BACTERIA  Pertinent Imaging:  No results found for this or any previous visit.  No results found for this or any previous visit.  No results found for this or any previous visit.  No results found for this or any previous visit.  No results found for this or any previous visit.  No results found for this or any previous visit.  No results found for this or any previous visit.  No results found for this or any previous visit.   Assessment & Plan:    1. OAB (overactive bladder) -Increase mirabegron to 50mg   2. Urinary urgency Increase mirabegron to 50mg    No follow-ups on file.  Nicolette Bang, MD  [facility] [place]

## 2020-07-18 DIAGNOSIS — N764 Abscess of vulva: Secondary | ICD-10-CM | POA: Diagnosis not present

## 2020-07-18 DIAGNOSIS — E039 Hypothyroidism, unspecified: Secondary | ICD-10-CM | POA: Diagnosis not present

## 2020-07-18 DIAGNOSIS — Z299 Encounter for prophylactic measures, unspecified: Secondary | ICD-10-CM | POA: Diagnosis not present

## 2020-07-18 DIAGNOSIS — E1142 Type 2 diabetes mellitus with diabetic polyneuropathy: Secondary | ICD-10-CM | POA: Diagnosis not present

## 2020-07-18 DIAGNOSIS — I10 Essential (primary) hypertension: Secondary | ICD-10-CM | POA: Diagnosis not present

## 2020-07-18 DIAGNOSIS — E1165 Type 2 diabetes mellitus with hyperglycemia: Secondary | ICD-10-CM | POA: Diagnosis not present

## 2020-07-22 ENCOUNTER — Other Ambulatory Visit: Payer: Self-pay

## 2020-07-22 MED ORDER — LEVOTHYROXINE SODIUM 50 MCG PO TABS: 50.0000 ug | ORAL_TABLET | Freq: Every day | ORAL | 0 refills | Status: DC

## 2020-08-05 DIAGNOSIS — E039 Hypothyroidism, unspecified: Secondary | ICD-10-CM | POA: Diagnosis not present

## 2020-08-05 DIAGNOSIS — I10 Essential (primary) hypertension: Secondary | ICD-10-CM | POA: Diagnosis not present

## 2020-08-05 DIAGNOSIS — E1142 Type 2 diabetes mellitus with diabetic polyneuropathy: Secondary | ICD-10-CM | POA: Diagnosis not present

## 2020-08-05 DIAGNOSIS — E890 Postprocedural hypothyroidism: Secondary | ICD-10-CM | POA: Diagnosis not present

## 2020-08-05 DIAGNOSIS — E1165 Type 2 diabetes mellitus with hyperglycemia: Secondary | ICD-10-CM | POA: Diagnosis not present

## 2020-08-05 DIAGNOSIS — Z299 Encounter for prophylactic measures, unspecified: Secondary | ICD-10-CM | POA: Diagnosis not present

## 2020-08-05 DIAGNOSIS — N3281 Overactive bladder: Secondary | ICD-10-CM | POA: Diagnosis not present

## 2020-08-05 LAB — TSH: TSH: 1.58 (ref <=5.90)

## 2020-08-12 ENCOUNTER — Telehealth (INDEPENDENT_AMBULATORY_CARE_PROVIDER_SITE_OTHER): Payer: Medicare Other | Admitting: Nurse Practitioner

## 2020-08-12 ENCOUNTER — Encounter: Payer: Self-pay | Admitting: Nurse Practitioner

## 2020-08-12 ENCOUNTER — Other Ambulatory Visit: Payer: Self-pay

## 2020-08-12 VITALS — Wt 153.0 lb

## 2020-08-12 DIAGNOSIS — E890 Postprocedural hypothyroidism: Secondary | ICD-10-CM

## 2020-08-12 MED ORDER — LEVOTHYROXINE SODIUM 75 MCG PO TABS: 75.0000 ug | ORAL_TABLET | Freq: Every day | ORAL | 3 refills | Status: DC

## 2020-08-12 NOTE — Progress Notes (Signed)
08/12/2020, 11:11 AM     Endocrinology follow-up note   TELEHEALTH VISIT: The patient is being engaged in telehealth visit due to COVID-19.  This type of visit limits physical examination significantly, and thus is not preferable over face-to-face encounters.  I connected with  Andrea Shannon on 08/12/20 by a video enabled telemedicine application and verified that I am speaking with the correct person using two identifiers.   I discussed the limitations of evaluation and management by telemedicine. The patient expressed understanding and agreed to proceed.    The participants involved in this visit include: Andrea Romp, NP located at [facility] and Andrea Shannon.  Subjective:    Patient ID: Andrea Shannon, female    DOB: Apr 24, 1946, PCP Monico Blitz, MD   Past Medical History:  Diagnosis Date  . Acid reflux   . Arthritis   . Diabetes mellitus, type II ([place])   . Hypertension   . Hyperthyroidism   . Hypothyroidism    Past Surgical History:  Procedure Laterality Date  . ABDOMINAL HYSTERECTOMY     Social History   Socioeconomic History  . Marital status: Widowed    Spouse name: Not on file  . Number of children: 1  . Years of education: Not on file  . Highest education level: Not on file  Occupational History  . Not on file  Tobacco Use  . Smoking status: Never Smoker  . Smokeless tobacco: Never Used  Vaping Use  . Vaping Use: Never used  Substance and Sexual Activity  . Alcohol use: Not Currently  . Drug use: Never  . Sexual activity: Not on file  Other Topics Concern  . Not on file  Social History Narrative  . Not on file   Social Determinants of Health   Financial Resource Strain:   . Difficulty of Paying Living Expenses: Not on file  Food Insecurity:   . Worried About Charity fundraiser in the Last Year: Not on file  .  Ran Out of Food in the Last Year: Not on file  Transportation Needs:   . Lack of Transportation (Medical): Not on file  . Lack of Transportation (Non-Medical): Not on file  Physical Activity:   . Days of Exercise per Week: Not on file  . Minutes of Exercise per Session: Not on file  Stress:   . Feeling of Stress : Not on file  Social Connections:   . Frequency of Communication with Friends and Family: Not on file  . Frequency of Social Gatherings with Friends and Family: Not on file  . Attends Religious Services: Not on file  . Active Member of Clubs or Organizations: Not on file  . Attends Archivist Meetings: Not on file  . Marital Status: Not on file   Outpatient Encounter Medications as of 08/12/2020  Medication Sig  . aspirin 81 MG chewable tablet Chew by mouth daily.  . diclofenac (VOLTAREN) 75 MG EC tablet Take 75 mg by mouth daily.  Marland Kitchen levothyroxine (SYNTHROID) 75 MCG tablet Take 1 tablet (75 mcg total) by mouth daily before breakfast.  . lisinopril-hydrochlorothiazide (PRINZIDE,ZESTORETIC) 20-12.5 MG tablet Take 1 tablet by mouth daily.  Marland Kitchen  mirabegron ER (MYRBETRIQ) 25 MG TB24 tablet Take 1 tablet (25 mg total) by mouth daily.  Marland Kitchen omeprazole (PRILOSEC) 20 MG capsule Take 20 mg by mouth daily.  . Vibegron (GEMTESA) 75 MG TABS Take by mouth.  . [DISCONTINUED] levothyroxine (SYNTHROID) 50 MCG tablet Take 1 tablet (50 mcg total) by mouth daily before breakfast.   No facility-administered encounter medications on file as of 08/12/2020.   ALLERGIES: No Known Allergies  VACCINATION STATUS:  There is no immunization history on file for this patient.  Thyroid Problem Presents for follow-up (-She was treated with I-131 on September 29, 2019 for toxic multinodular goiter.   She reports family history of thyroid dysfunction, family history of thyroid malignancy.) visit. Symptoms include fatigue, heat intolerance and weight gain. Patient reports no anxiety, cold intolerance,  constipation, depressed mood, diarrhea, palpitations, tremors or weight loss. (Gaining back some of the weight she lost previously) The symptoms have been stable.   Andrea Shannon is 74 y.o. female who presents today with a medical history as above.  Review of systems  Constitutional: + Minimally fluctuating body weight,  current Body mass index is 27.1 kg/m. , + fatigue, + subjective hyperthermia, no subjective hypothermia Eyes: no blurry vision, no xerophthalmia ENT: no sore throat, no nodules palpated in throat, no dysphagia/odynophagia, no hoarseness Cardiovascular: no chest pain, no shortness of breath, no palpitations, no leg swelling Respiratory: no cough, no shortness of breath Gastrointestinal: no nausea/vomiting/diarrhea Musculoskeletal: no muscle/joint aches Skin: no rashes, no hyperemia Neurological: no tremors, no numbness, no tingling, no dizziness Psychiatric: no depression, no anxiety  Objective:    Wt 153 lb (69.4 kg)   BMI 27.10 kg/m   Wt Readings from Last 3 Encounters:  08/12/20 153 lb (69.4 kg)  07/17/20 153 lb (69.4 kg)  04/15/20 153 lb (69.4 kg)     BP Readings from Last 3 Encounters:  07/17/20 123/65  04/15/20 129/71  04/09/20 120/78   Physical Exam- Telehealth- significantly limited due to nature of visit  Constitutional: Body mass index is 27.1 kg/m. , not in acute distress, normal state of mind Respiratory: Adequate breathing efforts   Recent Results (from the past 2160 hour(s))  Urinalysis, Routine w reflex microscopic     Status: None   Collection Time: 07/17/20  1:51 PM  Result Value Ref Range   Specific Gravity, UA 1.010 1.005 - 1.030   pH, UA 5.0 5.0 - 7.5   Color, UA Yellow Yellow   Appearance Ur Clear Clear   Leukocytes,UA Negative Negative   Protein,UA Negative Negative/Trace   Glucose, UA Negative Negative   Ketones, UA Negative Negative   RBC, UA Negative Negative   Bilirubin, UA Negative Negative   Urobilinogen, Ur 0.2 0.2  - 1.0 mg/dL   Nitrite, UA Negative Negative   Microscopic Examination Comment     Comment: Microscopic follows if indicated.  TSH     Status: None   Collection Time: 08/05/20 12:00 AM  Result Value Ref Range   TSH 1.58 0.41 - 5.90    Comment: FT4 - 0.89   Ultrasound of the thyroid on June 27, 2018 showed right lobe measuring 4.9 x 1.9 x 2.1 cm with 1 dominant mixed cystic and solid nodule measuring 2.2 x 1.5 x 1.8 cm.  Another nodule in the superior right lobe measuring 1.9 x 0.9 x 1.2 cm. Left lobe heterogeneous measuring 4.7 x 3.2 x 2.3 cm with dominant mixed cystic and solid nodule in the central left lobe measuring 2.6 x  2.4 x 2.3 cm.  Separate mixed cystic and solid nodule in the superior left lobe measuring 1.7 x 1.3 x 1.4 cm. Impression:  innumerable thyroid nodules, increasing probability of benignity.  2 suspicious right thyroid nodule measuring 1.9 x 0.9 x 1.2 cm in the superior right lobe.  Ultrasound-guided FNA is suggested.  More suspicious left thyroid nodule in the superior left lobe measuring 2.6 x 2.4 x 2.3 cm.  Ultrasound-guided FNA suggested.   Fine-needle aspiration on November 30, 2018 revealed atypia of undetermined significance,  afirma testing-reported benign, with malignancy risk less than 4%.     Thyroid uptake and scan on September 04, 2019 showed enlarged thyroid gland with several focal areas of increased uptake, likely representing nodules.  No areas of photopenia.  24-hour uptake 35%.   Results for TINIE, MCGLOIN (MRN [id number]) as of 08/12/2020 10:53  Ref. Range 10/19/2018 00:00 04/11/2019 00:00 08/14/2019 00:00 11/20/2019 00:00 01/22/2020 00:00  TSH Latest Ref Range: 0.41 - 5.90  0.48 1.26 0.01 (A) 0.01 (A) 0.04 (A)   US Thyroid 11/15/2018 Narrative & Impression  CLINICAL DATA:  Multinodular goiter  EXAM: THYROID ULTRASOUND  TECHNIQUE: Ultrasound examination of the thyroid gland and adjacent soft tissues was performed.  COMPARISON:  06/27/2018 by  report only from [facility]  FINDINGS: Parenchymal Echotexture: Moderately heterogenous  Isthmus: 0.3 cm thickness, stable  Right lobe: 5.6 x 2.3 x 2 cm, previously 4.9 x 1.9 x 2.1  Left lobe: 6.1 x 3.3 x 2.3 cm, previously 4.7 x 3.2 x 2.3  _________________________________________________________  Estimated total number of nodules >/= 1 cm: 6-10  Number of spongiform nodules >/=  2 cm not described below (TR1): 0  Number of mixed cystic and solid nodules >/= 1.5 cm not described below ([place]): 0  _________________________________________________________  Nodule # 1:  Location: Right; Superior  Maximum size: 1.4 cm; Other 2 dimensions: 1.1 x 1 cm  Composition: mixed cystic and solid (1)  Echogenicity: hypoechoic (2)  Shape: not taller-than-wide (0)  Margins: ill-defined (0)  Echogenic foci: none (0)  ACR TI-RADS total points: 3.  ACR TI-RADS risk category: TR3 (3 points).  ACR TI-RADS recommendations:  Given size (<1.4 cm) and appearance, this nodule does NOT meet TI-RADS criteria for biopsy or dedicated follow-up.  _________________________________________________________  Nodule # 2: 2.7 x 1.9 x 2 cm spongiform nodule, mid right; This nodule does NOT meet TI-RADS criteria for biopsy or dedicated follow-up.  Nodule # 3: 1.5 x 0.9 x 1.2 cm spongiform nodule, inferior right; This nodule does NOT meet TI-RADS criteria for biopsy or dedicated follow-up.  Nodule # 4:  Location: Left; Superior  Maximum size: 2 cm; Other 2 dimensions: 1.7 x 1.7 cm  Composition: mixed cystic and solid (1)  Echogenicity: hypoechoic (2)  Shape: not taller-than-wide (0)  Margins: smooth (0)  Echogenic foci: none (0)  ACR TI-RADS total points: 3.  ACR TI-RADS risk category: TR3 (3 points).  ACR TI-RADS recommendations:  *Given size (>/= 1.5 - 2.4 cm) and appearance, a follow-up ultrasound in 1 year should be considered  based on TI-RADS criteria.  _________________________________________________________  Nodule # 5:  Location: Left; Mid  Maximum size: 3 cm; Other 2 dimensions: 2.8 x 1.9 cm  Composition: mixed cystic and solid (1)  Echogenicity: isoechoic (1)  Shape: not taller-than-wide (0)  Margins: smooth (0)  Echogenic foci: none (0)  ACR TI-RADS total points: 2.  ACR TI-RADS risk category: TR2 (2 points).  ACR TI-RADS recommendations:  This nodule does NOT  meet TI-RADS criteria for biopsy or dedicated follow-up.  _________________________________________________________  Nodule # 6:  Location: Left; Inferior  Maximum size: 3.2 cm; Other 2 dimensions: 2.7 x 1.9 cm  Composition: mixed cystic and solid (1)  Echogenicity: isoechoic (1)  Shape: not taller-than-wide (0)  Margins: smooth (0)  Echogenic foci: none (0)  ACR TI-RADS total points: 2.  ACR TI-RADS risk category: TR2 (2 points).  ACR TI-RADS recommendations:  This nodule does NOT meet TI-RADS criteria for biopsy or dedicated follow-up.  _________________________________________________________  Nodule # 7:  Location: Left; Inferior posterior  Maximum size: 3 cm; Other 2 dimensions: 2.9 x 2.3 cm  Composition: solid/almost completely solid (2)  Echogenicity: isoechoic (1)  Shape: taller-than-wide (3)  Margins: ill-defined (0)  Echogenic foci: none (0)  ACR TI-RADS total points: 6.  ACR TI-RADS risk category: TR4 (4-6 points).  ACR TI-RADS recommendations:  **Given size (>/= 1.5 cm) and appearance, fine needle aspiration of this moderately suspicious nodule should be considered based on TI-RADS criteria.  IMPRESSION: 1. Thyromegaly with bilateral nodules. 2. Recommend FNA biopsy of moderately suspicious 3 cm inferior posterior nodule #7; This was not described on the prior study. 3. Recommend annual/biennial ultrasound follow-up of additional lesion  as above, until stability x5 years confirmed.     Assessment & Plan:   1. RAI - induced hypothyroidism -She status post I-131 thyroid ablation on September 29, 2019.    -Her previsit labs are consistent with slight under replacement.  She will tolerate increase in her Levothyroxine to 75 mcg po daily before breakfast.   - We discussed about the correct intake of her thyroid hormone, on empty stomach at fasting, with water, separated by at least 30 minutes from breakfast and other medications,  and separated by more than 4 hours from calcium, iron, multivitamins, acid reflux medications (PPIs). -Patient is made aware of the fact that thyroid hormone replacement is needed for life, dose to be adjusted by periodic monitoring of thyroid function tests.  2.  Multinodular goiter -Her fine-needle aspiration was significant for atypia of undetermined significance, a sample was sent for afirma testing-with grossly benign finding with malignancy is less than 4%.  She will not need surgical intervention at this time.    -Her thyroid US from 10/2018 shows multiple nodules recommending [place] follow up in 1 year.  Will order repeat thyroid ultrasound prior to next visit.   - I advised her  to maintain close follow up with Monico Blitz, MD for primary care needs.   I spent 20 minutes dedicated to the care of this patient on the date of this encounter to include pre-visit review of records, face-to-face time with the patient, and post visit ordering of  testing. Andrea Shannon  participated in the discussions, expressed understanding, and voiced agreement with the above plans.  All questions were answered to her satisfaction. she is encouraged to contact clinic should she have any questions or concerns prior to her return visit.    Follow up plan: Return in about 4 months (around 12/13/2020) for Thyroid follow up, thyroid ultrasound, Previsit labs.   Rayetta Pigg, [facility] [address] [place], [place] [address] Phone: [phone] Fax: [phone]   08/12/2020, 11:11 AM  This note was partially dictated with voice recognition software. Similar sounding words can be transcribed inadequately or may not  be corrected upon review.

## 2020-08-12 NOTE — Patient Instructions (Signed)

## 2020-08-28 ENCOUNTER — Encounter: Payer: Self-pay | Admitting: Urology

## 2020-08-28 ENCOUNTER — Ambulatory Visit (INDEPENDENT_AMBULATORY_CARE_PROVIDER_SITE_OTHER): Payer: Medicare Other | Admitting: Urology

## 2020-08-28 ENCOUNTER — Other Ambulatory Visit: Payer: Self-pay

## 2020-08-28 VITALS — BP 125/71 | HR 97 | Temp 98.7°F | Ht 63.0 in | Wt 151.0 lb

## 2020-08-28 DIAGNOSIS — R3915 Urgency of urination: Secondary | ICD-10-CM | POA: Diagnosis not present

## 2020-08-28 DIAGNOSIS — N3281 Overactive bladder: Secondary | ICD-10-CM

## 2020-08-28 LAB — URINALYSIS, ROUTINE W REFLEX MICROSCOPIC
Bilirubin, UA: NEGATIVE
Glucose, UA: NEGATIVE
Nitrite, UA: NEGATIVE
Protein,UA: NEGATIVE
RBC, UA: NEGATIVE
Specific Gravity, UA: 1.015 (ref 1.005–1.030)
Urobilinogen, Ur: 0.2 mg/dL (ref 0.2–1.0)
pH, UA: 5 (ref 5.0–7.5)

## 2020-08-28 LAB — MICROSCOPIC EXAMINATION
Epithelial Cells (non renal): >10 /hpf — AB (ref 0–10)
RBC, Urine: NONE SEEN /hpf (ref 0–2)
Renal Epithel, UA: NONE SEEN /hpf

## 2020-08-28 LAB — BLADDER SCAN AMB NON-IMAGING: Scan Result: 4

## 2020-08-28 MED ORDER — MIRABEGRON ER 50 MG PO TB24: 50.0000 mg | ORAL_TABLET | Freq: Every day | ORAL | 11 refills | Status: DC

## 2020-08-28 NOTE — Progress Notes (Signed)
08/28/2020 2:14 PM   Andrea Shannon Andrea Shannon Apr 22, 1946 [id number]  Referring provider: Monico Blitz, [address],  [place] [address]  OAB and suprapubic pressure  HPI: Ms Vitale is a 74yo here for followup for OAB and suprapubic pressure. She was started on mirabegron 50mg  last visit. The suprapubic pressure is significantly improved. The urgency and frequency has significantly improved. She is happier with her urination   PMH: Past Medical History:  Diagnosis Date  . Acid reflux   . Arthritis   . Diabetes mellitus, type II ([place])   . Hypertension   . Hyperthyroidism   . Hypothyroidism     Surgical History: Past Surgical History:  Procedure Laterality Date  . ABDOMINAL HYSTERECTOMY      Home Medications:  Allergies as of 08/28/2020   No Known Allergies     Medication List       Accurate as of August 28, 2020  2:14 PM. If you have any questions, ask your nurse or doctor.        aspirin 81 MG chewable tablet Chew by mouth daily.   diclofenac 75 MG EC tablet Commonly known as: VOLTAREN Take 75 mg by mouth daily.   Gemtesa 75 MG Tabs Generic drug: Vibegron Take by mouth.   latanoprost 0.005 % ophthalmic solution Commonly known as: XALATAN Apply to eye.   levothyroxine 75 MCG tablet Commonly known as: SYNTHROID Take 1 tablet (75 mcg total) by mouth daily before breakfast.   lisinopril-hydrochlorothiazide 20-12.5 MG tablet Commonly known as: ZESTORETIC Take 1 tablet by mouth daily.   mirabegron ER 25 MG Tb24 tablet Commonly known as: MYRBETRIQ Take 1 tablet (25 mg total) by mouth daily.   omeprazole 20 MG capsule Commonly known as: PRILOSEC Take 20 mg by mouth daily.       Allergies: No Known Allergies  Family History: Family History  Problem Relation Age of Onset  . Diabetes Mother     Social History:  reports that she has never smoked. She has never used smokeless tobacco. She reports previous alcohol use. She reports that she does  not use drugs.  ROS: All other review of systems were reviewed and are negative except what is noted above in HPI  Physical Exam: BP 125/71   Pulse 97   Temp 98.7 F (37.1 C)   Ht 5\' 3"  (1.6 m)   Wt 151 lb (68.5 kg)   BMI 26.75 kg/m   Constitutional:  Alert and oriented, No acute distress. HEENT: [place] AT, moist mucus membranes.  Trachea midline, no masses. Cardiovascular: No clubbing, cyanosis, or edema. Respiratory: Normal respiratory effort, no increased work of breathing. GI: Abdomen is soft, nontender, nondistended, no abdominal masses GU: No CVA tenderness.  Lymph: No cervical or inguinal lymphadenopathy. Skin: No rashes, bruises or suspicious lesions. Neurologic: Grossly intact, no focal deficits, moving all 4 extremities. Psychiatric: Normal mood and affect.  Laboratory Data: No results found for: WBC, HGB, HCT, MCV, PLT  No results found for: CREATININE  No results found for: PSA  No results found for: TESTOSTERONE  No results found for: HGBA1C  Urinalysis    Component Value Date/Time   APPEARANCEUR Clear 07/17/2020 1351   GLUCOSEU Negative 07/17/2020 1351   BILIRUBINUR Negative 07/17/2020 1351   PROTEINUR Negative 07/17/2020 1351   UROBILINOGEN negative (A) 04/15/2020 1507   NITRITE Negative 07/17/2020 1351   LEUKOCYTESUR Negative 07/17/2020 1351    Lab Results  Component Value Date   LABMICR Comment 07/17/2020    Pertinent  Imaging:  No results found for this or any previous visit.  No results found for this or any previous visit.  No results found for this or any previous visit.  No results found for this or any previous visit.  No results found for this or any previous visit.  No results found for this or any previous visit.  No results found for this or any previous visit.  No results found for this or any previous visit.   Assessment & Plan:    1. OAB (overactive bladder) -continue mirabegron 50mg  - Urinalysis, Routine w reflex  microscopic - Bladder Scan (Post Void Residual) in office  2. Urinary urgency -continue mirabegron 50mg    No follow-ups on file.  Nicolette Bang, MD  [facility] [place]

## 2020-08-28 NOTE — Progress Notes (Signed)
n 4nUrological Symptom Review  Patient is experiencing the following symptoms: None   Review of Systems  Gastrointestinal (upper)  : Indigestion/heartburn  Gastrointestinal (lower) : Negative for lower GI symptoms  Constitutional : Night Sweats  Skin: Itching  Eyes: Negative for eye symptoms  Ear/Nose/Throat : Sinus problems  Hematologic/Lymphatic: Negative for Hematologic/Lymphatic symptoms  Cardiovascular : Negative for cardiovascular symptoms  Respiratory : Negative for respiratory symptoms  Endocrine: Negative for endocrine symptoms  Musculoskeletal: Joint pain  Neurological: Negative for neurological symptoms  Psychologic: Negative for psychiatric symptoms

## 2020-08-28 NOTE — Patient Instructions (Signed)

## 2020-09-04 DIAGNOSIS — E119 Type 2 diabetes mellitus without complications: Secondary | ICD-10-CM | POA: Diagnosis not present

## 2020-09-04 DIAGNOSIS — H401131 Primary open-angle glaucoma, bilateral, mild stage: Secondary | ICD-10-CM | POA: Diagnosis not present

## 2020-09-04 DIAGNOSIS — H524 Presbyopia: Secondary | ICD-10-CM | POA: Diagnosis not present

## 2020-09-04 DIAGNOSIS — H5203 Hypermetropia, bilateral: Secondary | ICD-10-CM | POA: Diagnosis not present

## 2020-09-04 DIAGNOSIS — H04123 Dry eye syndrome of bilateral lacrimal glands: Secondary | ICD-10-CM | POA: Diagnosis not present

## 2020-09-04 DIAGNOSIS — H52223 Regular astigmatism, bilateral: Secondary | ICD-10-CM | POA: Diagnosis not present

## 2020-11-20 DIAGNOSIS — Z79899 Other long term (current) drug therapy: Secondary | ICD-10-CM | POA: Diagnosis not present

## 2020-11-20 DIAGNOSIS — E1165 Type 2 diabetes mellitus with hyperglycemia: Secondary | ICD-10-CM | POA: Diagnosis not present

## 2020-11-20 DIAGNOSIS — R5383 Other fatigue: Secondary | ICD-10-CM | POA: Diagnosis not present

## 2020-11-20 DIAGNOSIS — Z299 Encounter for prophylactic measures, unspecified: Secondary | ICD-10-CM | POA: Diagnosis not present

## 2020-11-20 DIAGNOSIS — M1990 Unspecified osteoarthritis, unspecified site: Secondary | ICD-10-CM | POA: Diagnosis not present

## 2020-11-20 DIAGNOSIS — I10 Essential (primary) hypertension: Secondary | ICD-10-CM | POA: Diagnosis not present

## 2020-11-20 DIAGNOSIS — E1143 Type 2 diabetes mellitus with diabetic autonomic (poly)neuropathy: Secondary | ICD-10-CM | POA: Diagnosis not present

## 2020-11-20 DIAGNOSIS — Z1331 Encounter for screening for depression: Secondary | ICD-10-CM | POA: Diagnosis not present

## 2020-11-20 DIAGNOSIS — Z6827 Body mass index (BMI) 27.0-27.9, adult: Secondary | ICD-10-CM | POA: Diagnosis not present

## 2020-11-20 DIAGNOSIS — E559 Vitamin D deficiency, unspecified: Secondary | ICD-10-CM | POA: Diagnosis not present

## 2020-11-20 DIAGNOSIS — E7800 Pure hypercholesterolemia, unspecified: Secondary | ICD-10-CM | POA: Diagnosis not present

## 2020-11-20 DIAGNOSIS — Z7189 Other specified counseling: Secondary | ICD-10-CM | POA: Diagnosis not present

## 2020-11-20 DIAGNOSIS — Z1339 Encounter for screening examination for other mental health and behavioral disorders: Secondary | ICD-10-CM | POA: Diagnosis not present

## 2020-11-20 DIAGNOSIS — E039 Hypothyroidism, unspecified: Secondary | ICD-10-CM | POA: Diagnosis not present

## 2020-11-20 DIAGNOSIS — Z0000 Encounter for general adult medical examination without abnormal findings: Secondary | ICD-10-CM | POA: Diagnosis not present

## 2020-11-25 ENCOUNTER — Ambulatory Visit (HOSPITAL_COMMUNITY): Payer: Medicare Other

## 2020-12-02 ENCOUNTER — Ambulatory Visit (HOSPITAL_COMMUNITY)
Admission: RE | Admit: 2020-12-02 | Discharge: 2020-12-02 | Disposition: A | Discharge status: Home / Self Care | Payer: Medicare Other | Source: Ambulatory Visit | Attending: Nurse Practitioner | Admitting: Nurse Practitioner

## 2020-12-02 ENCOUNTER — Other Ambulatory Visit: Payer: Self-pay

## 2020-12-02 DIAGNOSIS — E890 Postprocedural hypothyroidism: Secondary | ICD-10-CM | POA: Diagnosis not present

## 2020-12-02 DIAGNOSIS — E042 Nontoxic multinodular goiter: Secondary | ICD-10-CM | POA: Diagnosis not present

## 2020-12-06 DIAGNOSIS — E890 Postprocedural hypothyroidism: Secondary | ICD-10-CM | POA: Diagnosis not present

## 2020-12-06 LAB — TSH: TSH: 0.03 — AB (ref 0.41–5.90)

## 2020-12-13 ENCOUNTER — Ambulatory Visit (INDEPENDENT_AMBULATORY_CARE_PROVIDER_SITE_OTHER): Payer: Medicare Other | Admitting: Nurse Practitioner

## 2020-12-13 ENCOUNTER — Other Ambulatory Visit: Payer: Self-pay

## 2020-12-13 ENCOUNTER — Encounter: Payer: Self-pay | Admitting: Nurse Practitioner

## 2020-12-13 VITALS — BP 135/73 | HR 84 | Ht 63.0 in | Wt 159.0 lb

## 2020-12-13 DIAGNOSIS — E559 Vitamin D deficiency, unspecified: Secondary | ICD-10-CM

## 2020-12-13 DIAGNOSIS — E890 Postprocedural hypothyroidism: Secondary | ICD-10-CM

## 2020-12-13 DIAGNOSIS — E0520 Thyrotoxicosis with toxic multinodular goiter without thyrotoxic crisis or storm: Secondary | ICD-10-CM | POA: Diagnosis not present

## 2020-12-13 NOTE — Progress Notes (Signed)
12/13/2020, 10:26 AM     Endocrinology follow-up note    Subjective:    Patient ID: Andrea Shannon, female    DOB: 09/22/1946, PCP Monico Blitz, MD   Past Medical History:  Diagnosis Date  . Acid reflux   . Arthritis   . Diabetes mellitus, type II ([place])   . Hypertension   . Hyperthyroidism   . Hypothyroidism    Past Surgical History:  Procedure Laterality Date  . ABDOMINAL HYSTERECTOMY     Social History   Socioeconomic History  . Marital status: Widowed    Spouse name: Not on file  . Number of children: 1  . Years of education: Not on file  . Highest education level: Not on file  Occupational History  . Not on file  Tobacco Use  . Smoking status: Never Smoker  . Smokeless tobacco: Never Used  Vaping Use  . Vaping Use: Never used  Substance and Sexual Activity  . Alcohol use: Not Currently  . Drug use: Never  . Sexual activity: Not on file  Other Topics Concern  . Not on file  Social History Narrative  . Not on file   Social Determinants of Health   Financial Resource Strain: Not on file  Food Insecurity: Not on file  Transportation Needs: Not on file  Physical Activity: Not on file  Stress: Not on file  Social Connections: Not on file   Outpatient Encounter Medications as of 12/13/2020  Medication Sig  . aspirin 81 MG chewable tablet Chew by mouth daily.  . diclofenac (VOLTAREN) 75 MG EC tablet Take 75 mg by mouth daily.  Marland Kitchen latanoprost (XALATAN) 0.005 % ophthalmic solution Apply to eye.  . levothyroxine (SYNTHROID) 75 MCG tablet Take 1 tablet (75 mcg total) by mouth daily before breakfast.  . lisinopril-hydrochlorothiazide (PRINZIDE,ZESTORETIC) 20-12.5 MG tablet Take 1 tablet by mouth daily.  . mirabegron ER (MYRBETRIQ) 50 MG TB24 tablet Take 1 tablet (50 mg total) by mouth daily.  Marland Kitchen omeprazole (PRILOSEC) 20 MG capsule Take 20 mg by mouth daily.  . Vibegron (GEMTESA) 75 MG TABS Take by mouth.    No facility-administered encounter medications on file as of 12/13/2020.   ALLERGIES: No Known Allergies  VACCINATION STATUS:  There is no immunization history on file for this patient.  Thyroid Problem Presents for follow-up (-She was treated with I-131 on September 29, 2019 for toxic multinodular goiter.   She reports family history of thyroid dysfunction, family history of thyroid malignancy.) visit. Symptoms include fatigue, heat intolerance and weight gain. Patient reports no anxiety, cold intolerance, constipation, depressed mood, diarrhea, palpitations, tremors or weight loss. The symptoms have been stable.   Harvey Lingo is 75 y.o. female who presents today with a medical history as above.  Review of systems  Constitutional: + steadily increasing body weight,  current Body mass index is 28.17 kg/m. , + fatigue, + subjective hyperthermia, no subjective hypothermia Eyes: no blurry vision, no xerophthalmia ENT: no sore throat, no nodules palpated in throat, no dysphagia/odynophagia, no hoarseness Cardiovascular: no chest pain, no shortness of breath, mild intermittent palpitations, no leg swelling Respiratory: no cough, no shortness of breath Gastrointestinal: no nausea/vomiting/diarrhea Musculoskeletal: no muscle/joint aches Skin: no rashes, no  hyperemia Neurological: no tremors, no numbness, no tingling, no dizziness Psychiatric: no depression, no anxiety  Objective:    BP 135/73 (BP Location: Right Arm, Patient Position: Sitting)   Pulse 84   Ht 5\' 3"  (1.6 m)   Wt 159 lb (72.1 kg)   BMI 28.17 kg/m   Wt Readings from Last 3 Encounters:  12/13/20 159 lb (72.1 kg)  08/28/20 151 lb (68.5 kg)  08/12/20 153 lb (69.4 kg)     BP Readings from Last 3 Encounters:  12/13/20 135/73  08/28/20 125/71  07/17/20 123/65    Physical Exam- Limited  Constitutional:  Body mass index is 28.17 kg/m. , not in acute distress, normal state of mind Eyes:  EOMI, no  exophthalmos Neck: Supple Cardiovascular: RRR, no murmers, rubs, or gallops, no edema Respiratory: Adequate breathing efforts, no crackles, rales, rhonchi, or wheezing Musculoskeletal: no gross deformities, strength intact in all four extremities, no gross restriction of joint movements Skin:  no rashes, no hyperemia Neurological: no tremor with outstretched hands   Recent Results (from the past 2160 hour(s))  TSH     Status: Abnormal   Collection Time: 12/06/20 12:00 AM  Result Value Ref Range   TSH 0.03 (A) 0.41 - 5.90    Comment: FREE T4- 1.17   Ultrasound of the thyroid on June 27, 2018 showed right lobe measuring 4.9 x 1.9 x 2.1 cm with 1 dominant mixed cystic and solid nodule measuring 2.2 x 1.5 x 1.8 cm.  Another nodule in the superior right lobe measuring 1.9 x 0.9 x 1.2 cm. Left lobe heterogeneous measuring 4.7 x 3.2 x 2.3 cm with dominant mixed cystic and solid nodule in the central left lobe measuring 2.6 x 2.4 x 2.3 cm.  Separate mixed cystic and solid nodule in the superior left lobe measuring 1.7 x 1.3 x 1.4 cm. Impression:  innumerable thyroid nodules, increasing probability of benignity.  2 suspicious right thyroid nodule measuring 1.9 x 0.9 x 1.2 cm in the superior right lobe.  Ultrasound-guided FNA is suggested.  More suspicious left thyroid nodule in the superior left lobe measuring 2.6 x 2.4 x 2.3 cm.  Ultrasound-guided FNA suggested.   Fine-needle aspiration on November 30, 2018 revealed atypia of undetermined significance,  afirma testing-reported benign, with malignancy risk less than 4%.     Thyroid uptake and scan on September 04, 2019 showed enlarged thyroid gland with several focal areas of increased uptake, likely representing nodules.  No areas of photopenia.  24-hour uptake 35%.   Results for SPRING, SAN (MRN [id number]) as of 08/12/2020 10:53  Ref. Range 10/19/2018 00:00 04/11/2019 00:00 08/14/2019 00:00 11/20/2019 00:00 01/22/2020 00:00  TSH Latest Ref  Range: 0.41 - 5.90  0.48 1.26 0.01 (A) 0.01 (A) 0.04 (A)   US Thyroid 11/15/2018 Narrative & Impression  CLINICAL DATA:  Multinodular goiter  EXAM: THYROID ULTRASOUND  TECHNIQUE: Ultrasound examination of the thyroid gland and adjacent soft tissues was performed.  COMPARISON:  06/27/2018 by report only from [facility]  FINDINGS: Parenchymal Echotexture: Moderately heterogenous  Isthmus: 0.3 cm thickness, stable  Right lobe: 5.6 x 2.3 x 2 cm, previously 4.9 x 1.9 x 2.1  Left lobe: 6.1 x 3.3 x 2.3 cm, previously 4.7 x 3.2 x 2.3  _________________________________________________________  Estimated total number of nodules >/= 1 cm: 6-10  Number of spongiform nodules >/=  2 cm not described below (TR1): 0  Number of mixed cystic and solid nodules >/= 1.5 cm not described below ([place]): 0  _________________________________________________________  Nodule # 1:  Location: Right; Superior  Maximum size: 1.4 cm; Other 2 dimensions: 1.1 x 1 cm  Composition: mixed cystic and solid (1)  Echogenicity: hypoechoic (2)  Shape: not taller-than-wide (0)  Margins: ill-defined (0)  Echogenic foci: none (0)  ACR TI-RADS total points: 3.  ACR TI-RADS risk category: TR3 (3 points).  ACR TI-RADS recommendations:  Given size (<1.4 cm) and appearance, this nodule does NOT meet TI-RADS criteria for biopsy or dedicated follow-up.  _________________________________________________________  Nodule # 2: 2.7 x 1.9 x 2 cm spongiform nodule, mid right; This nodule does NOT meet TI-RADS criteria for biopsy or dedicated follow-up.  Nodule # 3: 1.5 x 0.9 x 1.2 cm spongiform nodule, inferior right; This nodule does NOT meet TI-RADS criteria for biopsy or dedicated follow-up.  Nodule # 4:  Location: Left; Superior  Maximum size: 2 cm; Other 2 dimensions: 1.7 x 1.7 cm  Composition: mixed cystic and solid (1)  Echogenicity: hypoechoic  (2)  Shape: not taller-than-wide (0)  Margins: smooth (0)  Echogenic foci: none (0)  ACR TI-RADS total points: 3.  ACR TI-RADS risk category: TR3 (3 points).  ACR TI-RADS recommendations:  *Given size (>/= 1.5 - 2.4 cm) and appearance, a follow-up ultrasound in 1 year should be considered based on TI-RADS criteria.  _________________________________________________________  Nodule # 5:  Location: Left; Mid  Maximum size: 3 cm; Other 2 dimensions: 2.8 x 1.9 cm  Composition: mixed cystic and solid (1)  Echogenicity: isoechoic (1)  Shape: not taller-than-wide (0)  Margins: smooth (0)  Echogenic foci: none (0)  ACR TI-RADS total points: 2.  ACR TI-RADS risk category: TR2 (2 points).  ACR TI-RADS recommendations:  This nodule does NOT meet TI-RADS criteria for biopsy or dedicated follow-up.  _________________________________________________________  Nodule # 6:  Location: Left; Inferior  Maximum size: 3.2 cm; Other 2 dimensions: 2.7 x 1.9 cm  Composition: mixed cystic and solid (1)  Echogenicity: isoechoic (1)  Shape: not taller-than-wide (0)  Margins: smooth (0)  Echogenic foci: none (0)  ACR TI-RADS total points: 2.  ACR TI-RADS risk category: TR2 (2 points).  ACR TI-RADS recommendations:  This nodule does NOT meet TI-RADS criteria for biopsy or dedicated follow-up.  _________________________________________________________  Nodule # 7:  Location: Left; Inferior posterior  Maximum size: 3 cm; Other 2 dimensions: 2.9 x 2.3 cm  Composition: solid/almost completely solid (2)  Echogenicity: isoechoic (1)  Shape: taller-than-wide (3)  Margins: ill-defined (0)  Echogenic foci: none (0)  ACR TI-RADS total points: 6.  ACR TI-RADS risk category: TR4 (4-6 points).  ACR TI-RADS recommendations:  **Given size (>/= 1.5 cm) and appearance, fine needle aspiration of this moderately suspicious  nodule should be considered based on TI-RADS criteria.  IMPRESSION: 1. Thyromegaly with bilateral nodules. 2. Recommend FNA biopsy of moderately suspicious 3 cm inferior posterior nodule #7; This was not described on the prior study. 3. Recommend annual/biennial ultrasound follow-up of additional lesion as above, until stability x5 years confirmed.   -------------------------------------------------------------------------------------------------------------------------------  FOLLOW UP THYROID US FROM 12/02/20 CLINICAL DATA:  Hypothyroidism. Left inferior thyroid nodule biopsy on 11/30/2018.  EXAM: THYROID ULTRASOUND  TECHNIQUE: Ultrasound examination of the thyroid gland and adjacent soft tissues was performed.  COMPARISON:  11/15/2018  FINDINGS: Parenchymal Echotexture: Moderately heterogenous  Isthmus: 0.2 cm, previously 0.3 cm  Right lobe: 4.9 x 1.7 x 1.6 cm, previously 5.6 x 2.3 x 2.0 cm  Left lobe: 5.1 x 2.2 x 1.7 cm, previously 6.1 x 2.8 x 2.3 cm  _________________________________________________________  Estimated total number of nodules >/= 1 cm: 5  Number of spongiform nodules >/=  2 cm not described below (TR1): 0  Number of mixed cystic and solid nodules >/= 1.5 cm not described below ([place]): 0  _________________________________________________________  Again noted are multiple right thyroid nodules. The right thyroid nodules are predominantly cystic or spongiform.  Nodule 2 in the right mid thyroid lobe has markedly decreased in size. This is a spongiform or mixed cystic and solid nodule that measures 1.3 x 0.9 x 0.9 cm and previously measured 2.7 x 1.9 x 2.0 cm.  Nodule 3 in the right inferior thyroid is mixed cystic and solid nodule that measures roughly 1.5 x 1.0 x 1.2 cm and previously measured 1.3 x 1.2 x 0.9 cm.  Multiple left thyroid nodules that are predominantly mixed cystic and solid nodules.  Nodule 5 in the left mid  thyroid lobe measures 1.3 x 0.5 x 0.5 cm and previously measured 2.8 x 1.9 x 3.0 cm. This is a mixed cystic and solid nodule and the solid portion is isoechoic.  Nodule 6 is a mixed cystic and solid nodule in the left inferior thyroid lobe measures 1.6 x 0.9 x 1.5 cm and previously measured 2.7 x 1.9 x 3.2 cm.  Nodule 7 in the left inferior thyroid lobe represents the previously biopsied nodule. This nodule is slightly hypoechoic and has scattered cystic areas. This nodule measures 2.1 x 2.0 x 1.5 cm and previously measured 3.0 x 2.9 x 2.3 cm.  IMPRESSION: 1. Multinodular goiter. 2. Most of the nodules are mixed cystic and solid composition. Many of the nodules have decreased in size as described. 3. No new suspicious thyroid nodules.  The above is in keeping with the ACR TI-RADS recommendations - J Am Coll Radiol 2017;14:587-595.   Electronically Signed   By: Markus Daft M.D.   On: 12/02/2020 14:20  Assessment & Plan:   1. RAI - induced hypothyroidism -She status post I-131 thyroid ablation on September 29, 2019.    -Her previsit labs are consistent with appropriate hormone replacement.  She is advised to continue Levothyroxine 75 mcg po daily before breakfast.  She is feeling better on this dose.    - We discussed about the correct intake of her thyroid hormone, on empty stomach at fasting, with water, separated by at least 30 minutes from breakfast and other medications,  and separated by more than 4 hours from calcium, iron, multivitamins, acid reflux medications (PPIs). -Patient is made aware of the fact that thyroid hormone replacement is needed for life, dose to be adjusted by periodic monitoring of thyroid function tests.  2.  Multinodular goiter -Her fine-needle aspiration was significant for atypia of undetermined significance, a sample was sent for afirma testing-with grossly benign finding with malignancy is less than 4%.  She will not need surgical intervention  at this time.    -Her thyroid US from 10/2018 shows multiple nodules recommending [place] follow up in 1 year.  Her repeat thyroid US from 11/2020 shows that all nodules have decreased in size and there are no new nodules.  No additional surveillance recommended at this time.    - I advised her  to maintain close follow up with Monico Blitz, MD for primary care needs.      - Time spent on this patient care encounter:  30 minutes of which 50% was spent in  counseling and the rest reviewing  her current and  previous labs / studies  and medications  doses and developing a plan for long term care, and documenting this care. Eli Phillips  participated in the discussions, expressed understanding, and voiced agreement with the above plans.  All questions were answered to her satisfaction. she is encouraged to contact clinic should she have any questions or concerns prior to her return visit.   Follow up plan: Return in about 3 months (around 03/12/2021) for Thyroid follow up, Previsit labs.   Rayetta Pigg, [facility] [address] [place], [place] [address] Phone: [phone] Fax: [phone]   12/13/2020, 10:26 AM

## 2020-12-13 NOTE — Patient Instructions (Signed)

## 2021-02-24 DIAGNOSIS — K219 Gastro-esophageal reflux disease without esophagitis: Secondary | ICD-10-CM | POA: Diagnosis not present

## 2021-02-24 DIAGNOSIS — N951 Menopausal and female climacteric states: Secondary | ICD-10-CM | POA: Diagnosis not present

## 2021-02-24 DIAGNOSIS — M5431 Sciatica, right side: Secondary | ICD-10-CM | POA: Diagnosis not present

## 2021-02-24 DIAGNOSIS — E1165 Type 2 diabetes mellitus with hyperglycemia: Secondary | ICD-10-CM | POA: Diagnosis not present

## 2021-02-24 DIAGNOSIS — Z299 Encounter for prophylactic measures, unspecified: Secondary | ICD-10-CM | POA: Diagnosis not present

## 2021-02-24 DIAGNOSIS — E1143 Type 2 diabetes mellitus with diabetic autonomic (poly)neuropathy: Secondary | ICD-10-CM | POA: Diagnosis not present

## 2021-02-24 DIAGNOSIS — I10 Essential (primary) hypertension: Secondary | ICD-10-CM | POA: Diagnosis not present

## 2021-03-05 DIAGNOSIS — H401131 Primary open-angle glaucoma, bilateral, mild stage: Secondary | ICD-10-CM | POA: Diagnosis not present

## 2021-03-12 DIAGNOSIS — E559 Vitamin D deficiency, unspecified: Secondary | ICD-10-CM | POA: Diagnosis not present

## 2021-03-12 DIAGNOSIS — E890 Postprocedural hypothyroidism: Secondary | ICD-10-CM | POA: Diagnosis not present

## 2021-03-13 ENCOUNTER — Ambulatory Visit: Payer: Medicare Other | Admitting: Nurse Practitioner

## 2021-03-13 DIAGNOSIS — Z1231 Encounter for screening mammogram for malignant neoplasm of breast: Secondary | ICD-10-CM | POA: Diagnosis not present

## 2021-03-13 LAB — VITAMIN D 25 HYDROXY (VIT D DEFICIENCY, FRACTURES): Vit D, 25-Hydroxy: 65.9

## 2021-03-18 NOTE — Patient Instructions (Signed)

## 2021-03-19 ENCOUNTER — Ambulatory Visit (INDEPENDENT_AMBULATORY_CARE_PROVIDER_SITE_OTHER): Payer: Medicare Other | Admitting: Nurse Practitioner

## 2021-03-19 ENCOUNTER — Other Ambulatory Visit: Payer: Self-pay

## 2021-03-19 ENCOUNTER — Encounter: Payer: Self-pay | Admitting: Nurse Practitioner

## 2021-03-19 VITALS — BP 134/75 | HR 88 | Ht 63.0 in | Wt 154.0 lb

## 2021-03-19 DIAGNOSIS — E0520 Thyrotoxicosis with toxic multinodular goiter without thyrotoxic crisis or storm: Secondary | ICD-10-CM

## 2021-03-19 DIAGNOSIS — E890 Postprocedural hypothyroidism: Secondary | ICD-10-CM

## 2021-03-19 DIAGNOSIS — E559 Vitamin D deficiency, unspecified: Secondary | ICD-10-CM

## 2021-03-19 NOTE — Progress Notes (Signed)
03/19/2021, 10:39 AM     Endocrinology follow-up note    Subjective:    Patient ID: Bailea Beed, female    DOB: Jun 25, 1946, PCP Monico Blitz, MD   Past Medical History:  Diagnosis Date  . Acid reflux   . Arthritis   . Diabetes mellitus, type II ([place])   . Hypertension   . Hyperthyroidism   . Hypothyroidism    Past Surgical History:  Procedure Laterality Date  . ABDOMINAL HYSTERECTOMY     Social History   Socioeconomic History  . Marital status: Widowed    Spouse name: Not on file  . Number of children: 1  . Years of education: Not on file  . Highest education level: Not on file  Occupational History  . Not on file  Tobacco Use  . Smoking status: Never Smoker  . Smokeless tobacco: Never Used  Vaping Use  . Vaping Use: Never used  Substance and Sexual Activity  . Alcohol use: Not Currently  . Drug use: Never  . Sexual activity: Not on file  Other Topics Concern  . Not on file  Social History Narrative  . Not on file   Social Determinants of Health   Financial Resource Strain: Not on file  Food Insecurity: Not on file  Transportation Needs: Not on file  Physical Activity: Not on file  Stress: Not on file  Social Connections: Not on file   Outpatient Encounter Medications as of 03/19/2021  Medication Sig  . aspirin 81 MG chewable tablet Chew by mouth daily.  . diclofenac (VOLTAREN) 75 MG EC tablet Take 75 mg by mouth daily.  Marland Kitchen latanoprost (XALATAN) 0.005 % ophthalmic solution Apply to eye.  . levothyroxine (SYNTHROID) 75 MCG tablet Take 1 tablet (75 mcg total) by mouth daily before breakfast.  . lisinopril-hydrochlorothiazide (PRINZIDE,ZESTORETIC) 20-12.5 MG tablet Take 1 tablet by mouth daily.  . mirabegron ER (MYRBETRIQ) 50 MG TB24 tablet Take 1 tablet (50 mg total) by mouth daily.  Marland Kitchen omeprazole (PRILOSEC) 40 MG capsule Take 40 mg by mouth daily.  . Vibegron (GEMTESA) 75 MG TABS Take by mouth.    No facility-administered encounter medications on file as of 03/19/2021.   ALLERGIES: No Known Allergies  VACCINATION STATUS:  There is no immunization history on file for this patient.  Thyroid Problem Presents for follow-up (-She was treated with I-131 on September 29, 2019 for toxic multinodular goiter.   She reports family history of thyroid dysfunction, family history of thyroid malignancy.) visit. Symptoms include heat intolerance. Patient reports no anxiety, cold intolerance, constipation, depressed mood, diarrhea, fatigue, palpitations, tremors, weight gain or weight loss. The symptoms have been stable.   Rolene Andrades is 75 y.o. female who presents today with a medical history as above.  Review of systems  Constitutional: + stable body weight  current Body mass index is 27.28 kg/m. , no fatigue, + subjective hyperthermia-intermittent, no subjective hypothermia Eyes: no blurry vision, no xerophthalmia ENT: no sore throat, no nodules palpated in throat, no dysphagia/odynophagia, no hoarseness Cardiovascular: no chest pain, no shortness of breath, no palpitations, no leg swelling Respiratory: no cough, no shortness of breath Gastrointestinal: no nausea/vomiting/diarrhea Musculoskeletal: no muscle/joint aches Skin: no rashes, no hyperemia Neurological: no  tremors, no numbness, no tingling, no dizziness Psychiatric: no depression, no anxiety  Objective:    BP 134/75   Pulse 88   Ht 5\' 3"  (1.6 m)   Wt 154 lb (69.9 kg)   BMI 27.28 kg/m   Wt Readings from Last 3 Encounters:  03/19/21 154 lb (69.9 kg)  12/13/20 159 lb (72.1 kg)  08/28/20 151 lb (68.5 kg)     BP Readings from Last 3 Encounters:  03/19/21 134/75  12/13/20 135/73  08/28/20 125/71     Physical Exam- Limited  Constitutional:  Body mass index is 27.28 kg/m. , not in acute distress, normal state of mind Eyes:  EOMI, no exophthalmos Neck: Supple Cardiovascular: RRR, no murmurs, rubs, or gallops, no  edema Respiratory: Adequate breathing efforts, no crackles, rales, rhonchi, or wheezing Musculoskeletal: no gross deformities, strength intact in all four extremities, no gross restriction of joint movements Skin:  no rashes, no hyperemia Neurological: no tremor with outstretched hands   Recent Results (from the past 2160 hour(s))  VITAMIN D 25 Hydroxy (Vit-D Deficiency, Fractures)     Status: None   Collection Time: 03/13/21 12:00 AM  Result Value Ref Range   Vit D, 25-Hydroxy 65.9    Ultrasound of the thyroid on June 27, 2018 showed right lobe measuring 4.9 x 1.9 x 2.1 cm with 1 dominant mixed cystic and solid nodule measuring 2.2 x 1.5 x 1.8 cm.  Another nodule in the superior right lobe measuring 1.9 x 0.9 x 1.2 cm. Left lobe heterogeneous measuring 4.7 x 3.2 x 2.3 cm with dominant mixed cystic and solid nodule in the central left lobe measuring 2.6 x 2.4 x 2.3 cm.  Separate mixed cystic and solid nodule in the superior left lobe measuring 1.7 x 1.3 x 1.4 cm. Impression:  innumerable thyroid nodules, increasing probability of benignity.  2 suspicious right thyroid nodule measuring 1.9 x 0.9 x 1.2 cm in the superior right lobe.  Ultrasound-guided FNA is suggested.  More suspicious left thyroid nodule in the superior left lobe measuring 2.6 x 2.4 x 2.3 cm.  Ultrasound-guided FNA suggested.   Fine-needle aspiration on November 30, 2018 revealed atypia of undetermined significance,  afirma testing-reported benign, with malignancy risk less than 4%.     Thyroid uptake and scan on September 04, 2019 showed enlarged thyroid gland with several focal areas of increased uptake, likely representing nodules.  No areas of photopenia.  24-hour uptake 35%.   Results for TALIYA, MCCLARD (MRN [id number]) as of 08/12/2020 10:53  Ref. Range 10/19/2018 00:00 04/11/2019 00:00 08/14/2019 00:00 11/20/2019 00:00 01/22/2020 00:00  TSH Latest Ref Range: 0.41 - 5.90  0.48 1.26 0.01 (A) 0.01 (A) 0.04 (A)   US  Thyroid 11/15/2018 Narrative & Impression  CLINICAL DATA:  Multinodular goiter  EXAM: THYROID ULTRASOUND  TECHNIQUE: Ultrasound examination of the thyroid gland and adjacent soft tissues was performed.  COMPARISON:  06/27/2018 by report only from [facility]  FINDINGS: Parenchymal Echotexture: Moderately heterogenous  Isthmus: 0.3 cm thickness, stable  Right lobe: 5.6 x 2.3 x 2 cm, previously 4.9 x 1.9 x 2.1  Left lobe: 6.1 x 3.3 x 2.3 cm, previously 4.7 x 3.2 x 2.3  _________________________________________________________  Estimated total number of nodules >/= 1 cm: 6-10  Number of spongiform nodules >/=  2 cm not described below (TR1): 0  Number of mixed cystic and solid nodules >/= 1.5 cm not described below ([place]): 0  _________________________________________________________  Nodule # 1:  Location: Right; Superior  Maximum size: 1.4 cm; Other 2 dimensions: 1.1 x 1 cm  Composition: mixed cystic and solid (1)  Echogenicity: hypoechoic (2)  Shape: not taller-than-wide (0)  Margins: ill-defined (0)  Echogenic foci: none (0)  ACR TI-RADS total points: 3.  ACR TI-RADS risk category: TR3 (3 points).  ACR TI-RADS recommendations:  Given size (<1.4 cm) and appearance, this nodule does NOT meet TI-RADS criteria for biopsy or dedicated follow-up.  _________________________________________________________  Nodule # 2: 2.7 x 1.9 x 2 cm spongiform nodule, mid right; This nodule does NOT meet TI-RADS criteria for biopsy or dedicated follow-up.  Nodule # 3: 1.5 x 0.9 x 1.2 cm spongiform nodule, inferior right; This nodule does NOT meet TI-RADS criteria for biopsy or dedicated follow-up.  Nodule # 4:  Location: Left; Superior  Maximum size: 2 cm; Other 2 dimensions: 1.7 x 1.7 cm  Composition: mixed cystic and solid (1)  Echogenicity: hypoechoic (2)  Shape: not taller-than-wide (0)  Margins: smooth  (0)  Echogenic foci: none (0)  ACR TI-RADS total points: 3.  ACR TI-RADS risk category: TR3 (3 points).  ACR TI-RADS recommendations:  *Given size (>/= 1.5 - 2.4 cm) and appearance, a follow-up ultrasound in 1 year should be considered based on TI-RADS criteria.  _________________________________________________________  Nodule # 5:  Location: Left; Mid  Maximum size: 3 cm; Other 2 dimensions: 2.8 x 1.9 cm  Composition: mixed cystic and solid (1)  Echogenicity: isoechoic (1)  Shape: not taller-than-wide (0)  Margins: smooth (0)  Echogenic foci: none (0)  ACR TI-RADS total points: 2.  ACR TI-RADS risk category: TR2 (2 points).  ACR TI-RADS recommendations:  This nodule does NOT meet TI-RADS criteria for biopsy or dedicated follow-up.  _________________________________________________________  Nodule # 6:  Location: Left; Inferior  Maximum size: 3.2 cm; Other 2 dimensions: 2.7 x 1.9 cm  Composition: mixed cystic and solid (1)  Echogenicity: isoechoic (1)  Shape: not taller-than-wide (0)  Margins: smooth (0)  Echogenic foci: none (0)  ACR TI-RADS total points: 2.  ACR TI-RADS risk category: TR2 (2 points).  ACR TI-RADS recommendations:  This nodule does NOT meet TI-RADS criteria for biopsy or dedicated follow-up.  _________________________________________________________  Nodule # 7:  Location: Left; Inferior posterior  Maximum size: 3 cm; Other 2 dimensions: 2.9 x 2.3 cm  Composition: solid/almost completely solid (2)  Echogenicity: isoechoic (1)  Shape: taller-than-wide (3)  Margins: ill-defined (0)  Echogenic foci: none (0)  ACR TI-RADS total points: 6.  ACR TI-RADS risk category: TR4 (4-6 points).  ACR TI-RADS recommendations:  **Given size (>/= 1.5 cm) and appearance, fine needle aspiration of this moderately suspicious nodule should be considered based on TI-RADS  criteria.  IMPRESSION: 1. Thyromegaly with bilateral nodules. 2. Recommend FNA biopsy of moderately suspicious 3 cm inferior posterior nodule #7; This was not described on the prior study. 3. Recommend annual/biennial ultrasound follow-up of additional lesion as above, until stability x5 years confirmed.   -------------------------------------------------------------------------------------------------------------------------------  FOLLOW UP THYROID US FROM 12/02/20 CLINICAL DATA:  Hypothyroidism. Left inferior thyroid nodule biopsy on 11/30/2018.  EXAM: THYROID ULTRASOUND  TECHNIQUE: Ultrasound examination of the thyroid gland and adjacent soft tissues was performed.  COMPARISON:  11/15/2018  FINDINGS: Parenchymal Echotexture: Moderately heterogenous  Isthmus: 0.2 cm, previously 0.3 cm  Right lobe: 4.9 x 1.7 x 1.6 cm, previously 5.6 x 2.3 x 2.0 cm  Left lobe: 5.1 x 2.2 x 1.7 cm, previously 6.1 x 2.8 x 2.3 cm  _________________________________________________________  Estimated total number of nodules >/= 1 cm:  5  Number of spongiform nodules >/=  2 cm not described below (TR1): 0  Number of mixed cystic and solid nodules >/= 1.5 cm not described below (TR2): 0  _________________________________________________________  Again noted are multiple right thyroid nodules. The right thyroid nodules are predominantly cystic or spongiform.  Nodule 2 in the right mid thyroid lobe has markedly decreased in size. This is a spongiform or mixed cystic and solid nodule that measures 1.3 x 0.9 x 0.9 cm and previously measured 2.7 x 1.9 x 2.0 cm.  Nodule 3 in the right inferior thyroid is mixed cystic and solid nodule that measures roughly 1.5 x 1.0 x 1.2 cm and previously measured 1.3 x 1.2 x 0.9 cm.  Multiple left thyroid nodules that are predominantly mixed cystic and solid nodules.  Nodule 5 in the left mid thyroid lobe measures 1.3 x 0.5 x 0.5 cm and  previously measured 2.8 x 1.9 x 3.0 cm. This is a mixed cystic and solid nodule and the solid portion is isoechoic.  Nodule 6 is a mixed cystic and solid nodule in the left inferior thyroid lobe measures 1.6 x 0.9 x 1.5 cm and previously measured 2.7 x 1.9 x 3.2 cm.  Nodule 7 in the left inferior thyroid lobe represents the previously biopsied nodule. This nodule is slightly hypoechoic and has scattered cystic areas. This nodule measures 2.1 x 2.0 x 1.5 cm and previously measured 3.0 x 2.9 x 2.3 cm.  IMPRESSION: 1. Multinodular goiter. 2. Most of the nodules are mixed cystic and solid composition. Many of the nodules have decreased in size as described. 3. No new suspicious thyroid nodules.  The above is in keeping with the ACR TI-RADS recommendations - J Am Coll Radiol 2017;14:587-595.   Electronically Signed   By: Markus Daft M.D.   On: 12/02/2020 14:20  Assessment & Plan:   1. RAI - induced hypothyroidism -She status post I-131 thyroid ablation on September 29, 2019.    -Her previsit TFTs are consistent with appropriate hormone replacement.  She is advised to continue Levothyroxine 75 mcg po daily before breakfast.    - We discussed about the correct intake of her thyroid hormone, on empty stomach at fasting, with water, separated by at least 30 minutes from breakfast and other medications,  and separated by more than 4 hours from calcium, iron, multivitamins, acid reflux medications (PPIs). -Patient is made aware of the fact that thyroid hormone replacement is needed for life, dose to be adjusted by periodic monitoring of thyroid function tests.  2.  Multinodular goiter -Her fine-needle aspiration was significant for atypia of undetermined significance, a sample was sent for afirma testing-with grossly benign finding with malignancy is less than 4%.  She will not need surgical intervention at this time.    -Her thyroid US from 10/2018 shows multiple nodules which  recommended follow up with [place] in 1 year. Her repeat thyroid US from 11/2020 shows that all nodules have decreased in size and there are no new nodules.  No additional surveillance recommended at this time.    - I advised her  to maintain close follow up with Monico Blitz, MD for primary care needs.     I spent 20 minutes in the care of the patient today including review of labs from Thyroid Function, CMP, and other relevant labs ; imaging/biopsy records (current and previous including abstractions from other facilities); face-to-face time discussing  her lab results and symptoms, medications doses, her options of short and  long term treatment based on the latest standards of care / guidelines;   and documenting the encounter.  Eli Phillips  participated in the discussions, expressed understanding, and voiced agreement with the above plans.  All questions were answered to her satisfaction. she is encouraged to contact clinic should she have any questions or concerns prior to her return visit.  Follow up plan: Return in about 6 months (around 09/18/2021) for Thyroid follow up, Previsit labs.   Rayetta Pigg, [facility] [address] [place], [place] [address] Phone: [phone] Fax: [phone]   03/19/2021, 10:39 AM

## 2021-03-25 DIAGNOSIS — M549 Dorsalgia, unspecified: Secondary | ICD-10-CM | POA: Diagnosis not present

## 2021-03-25 DIAGNOSIS — Z6827 Body mass index (BMI) 27.0-27.9, adult: Secondary | ICD-10-CM | POA: Diagnosis not present

## 2021-03-25 DIAGNOSIS — E1143 Type 2 diabetes mellitus with diabetic autonomic (poly)neuropathy: Secondary | ICD-10-CM | POA: Diagnosis not present

## 2021-03-25 DIAGNOSIS — I10 Essential (primary) hypertension: Secondary | ICD-10-CM | POA: Diagnosis not present

## 2021-03-25 DIAGNOSIS — Z299 Encounter for prophylactic measures, unspecified: Secondary | ICD-10-CM | POA: Diagnosis not present

## 2021-03-25 DIAGNOSIS — Z789 Other specified health status: Secondary | ICD-10-CM | POA: Diagnosis not present

## 2021-04-04 DIAGNOSIS — H43812 Vitreous degeneration, left eye: Secondary | ICD-10-CM | POA: Diagnosis not present

## 2021-04-16 DIAGNOSIS — M25551 Pain in right hip: Secondary | ICD-10-CM | POA: Diagnosis not present

## 2021-04-16 DIAGNOSIS — I10 Essential (primary) hypertension: Secondary | ICD-10-CM | POA: Diagnosis not present

## 2021-04-16 DIAGNOSIS — Z6827 Body mass index (BMI) 27.0-27.9, adult: Secondary | ICD-10-CM | POA: Diagnosis not present

## 2021-04-16 DIAGNOSIS — M25552 Pain in left hip: Secondary | ICD-10-CM | POA: Diagnosis not present

## 2021-04-16 DIAGNOSIS — M79604 Pain in right leg: Secondary | ICD-10-CM | POA: Diagnosis not present

## 2021-04-16 DIAGNOSIS — Z299 Encounter for prophylactic measures, unspecified: Secondary | ICD-10-CM | POA: Diagnosis not present

## 2021-04-16 DIAGNOSIS — M545 Low back pain, unspecified: Secondary | ICD-10-CM | POA: Diagnosis not present

## 2021-04-16 DIAGNOSIS — R030 Elevated blood-pressure reading, without diagnosis of hypertension: Secondary | ICD-10-CM | POA: Diagnosis not present

## 2021-04-16 DIAGNOSIS — M79605 Pain in left leg: Secondary | ICD-10-CM | POA: Diagnosis not present

## 2021-04-28 DIAGNOSIS — M79605 Pain in left leg: Secondary | ICD-10-CM | POA: Diagnosis not present

## 2021-04-28 DIAGNOSIS — I70219 Atherosclerosis of native arteries of extremities with intermittent claudication, unspecified extremity: Secondary | ICD-10-CM | POA: Diagnosis not present

## 2021-05-20 DIAGNOSIS — I10 Essential (primary) hypertension: Secondary | ICD-10-CM | POA: Diagnosis not present

## 2021-05-20 DIAGNOSIS — R059 Cough, unspecified: Secondary | ICD-10-CM | POA: Diagnosis not present

## 2021-05-20 DIAGNOSIS — Z299 Encounter for prophylactic measures, unspecified: Secondary | ICD-10-CM | POA: Diagnosis not present

## 2021-05-20 DIAGNOSIS — M79604 Pain in right leg: Secondary | ICD-10-CM | POA: Diagnosis not present

## 2021-05-20 DIAGNOSIS — M549 Dorsalgia, unspecified: Secondary | ICD-10-CM | POA: Diagnosis not present

## 2021-05-20 DIAGNOSIS — M79605 Pain in left leg: Secondary | ICD-10-CM | POA: Diagnosis not present

## 2021-05-26 ENCOUNTER — Other Ambulatory Visit (HOSPITAL_COMMUNITY): Payer: Self-pay | Admitting: Internal Medicine

## 2021-05-26 DIAGNOSIS — M545 Low back pain, unspecified: Secondary | ICD-10-CM

## 2021-06-06 ENCOUNTER — Other Ambulatory Visit: Payer: Self-pay

## 2021-06-06 ENCOUNTER — Ambulatory Visit (HOSPITAL_COMMUNITY)
Admission: RE | Admit: 2021-06-06 | Discharge: 2021-06-06 | Disposition: A | Discharge status: Home / Self Care | Payer: Medicare Other | Source: Ambulatory Visit | Attending: Internal Medicine | Admitting: Internal Medicine

## 2021-06-06 DIAGNOSIS — M545 Low back pain, unspecified: Secondary | ICD-10-CM | POA: Diagnosis not present

## 2021-06-10 DIAGNOSIS — I10 Essential (primary) hypertension: Secondary | ICD-10-CM | POA: Diagnosis not present

## 2021-06-10 DIAGNOSIS — M79606 Pain in leg, unspecified: Secondary | ICD-10-CM | POA: Diagnosis not present

## 2021-06-10 DIAGNOSIS — Z299 Encounter for prophylactic measures, unspecified: Secondary | ICD-10-CM | POA: Diagnosis not present

## 2021-06-10 DIAGNOSIS — Z6827 Body mass index (BMI) 27.0-27.9, adult: Secondary | ICD-10-CM | POA: Diagnosis not present

## 2021-06-10 DIAGNOSIS — E1143 Type 2 diabetes mellitus with diabetic autonomic (poly)neuropathy: Secondary | ICD-10-CM | POA: Diagnosis not present

## 2021-06-10 DIAGNOSIS — M549 Dorsalgia, unspecified: Secondary | ICD-10-CM | POA: Diagnosis not present

## 2021-06-10 DIAGNOSIS — E1165 Type 2 diabetes mellitus with hyperglycemia: Secondary | ICD-10-CM | POA: Diagnosis not present

## 2021-06-10 DIAGNOSIS — R52 Pain, unspecified: Secondary | ICD-10-CM | POA: Diagnosis not present

## 2021-06-16 DIAGNOSIS — I739 Peripheral vascular disease, unspecified: Secondary | ICD-10-CM | POA: Diagnosis not present

## 2021-06-16 DIAGNOSIS — I70211 Atherosclerosis of native arteries of extremities with intermittent claudication, right leg: Secondary | ICD-10-CM | POA: Diagnosis not present

## 2021-06-20 DIAGNOSIS — R2689 Other abnormalities of gait and mobility: Secondary | ICD-10-CM | POA: Diagnosis not present

## 2021-06-20 DIAGNOSIS — M545 Low back pain, unspecified: Secondary | ICD-10-CM | POA: Diagnosis not present

## 2021-06-20 DIAGNOSIS — M2569 Stiffness of other specified joint, not elsewhere classified: Secondary | ICD-10-CM | POA: Diagnosis not present

## 2021-06-20 DIAGNOSIS — R29898 Other symptoms and signs involving the musculoskeletal system: Secondary | ICD-10-CM | POA: Diagnosis not present

## 2021-06-20 DIAGNOSIS — G8929 Other chronic pain: Secondary | ICD-10-CM | POA: Diagnosis not present

## 2021-07-01 DIAGNOSIS — M545 Low back pain, unspecified: Secondary | ICD-10-CM | POA: Diagnosis not present

## 2021-07-01 DIAGNOSIS — G8929 Other chronic pain: Secondary | ICD-10-CM | POA: Diagnosis not present

## 2021-07-01 DIAGNOSIS — M2569 Stiffness of other specified joint, not elsewhere classified: Secondary | ICD-10-CM | POA: Diagnosis not present

## 2021-07-01 DIAGNOSIS — R29898 Other symptoms and signs involving the musculoskeletal system: Secondary | ICD-10-CM | POA: Diagnosis not present

## 2021-07-01 DIAGNOSIS — R2689 Other abnormalities of gait and mobility: Secondary | ICD-10-CM | POA: Diagnosis not present

## 2021-07-03 DIAGNOSIS — R2689 Other abnormalities of gait and mobility: Secondary | ICD-10-CM | POA: Diagnosis not present

## 2021-07-03 DIAGNOSIS — G8929 Other chronic pain: Secondary | ICD-10-CM | POA: Diagnosis not present

## 2021-07-03 DIAGNOSIS — M2569 Stiffness of other specified joint, not elsewhere classified: Secondary | ICD-10-CM | POA: Diagnosis not present

## 2021-07-03 DIAGNOSIS — R29898 Other symptoms and signs involving the musculoskeletal system: Secondary | ICD-10-CM | POA: Diagnosis not present

## 2021-07-03 DIAGNOSIS — M545 Low back pain, unspecified: Secondary | ICD-10-CM | POA: Diagnosis not present

## 2021-07-08 DIAGNOSIS — M2569 Stiffness of other specified joint, not elsewhere classified: Secondary | ICD-10-CM | POA: Diagnosis not present

## 2021-07-08 DIAGNOSIS — G8929 Other chronic pain: Secondary | ICD-10-CM | POA: Diagnosis not present

## 2021-07-08 DIAGNOSIS — R2689 Other abnormalities of gait and mobility: Secondary | ICD-10-CM | POA: Diagnosis not present

## 2021-07-08 DIAGNOSIS — M545 Low back pain, unspecified: Secondary | ICD-10-CM | POA: Diagnosis not present

## 2021-07-08 DIAGNOSIS — R29898 Other symptoms and signs involving the musculoskeletal system: Secondary | ICD-10-CM | POA: Diagnosis not present

## 2021-07-10 DIAGNOSIS — R2689 Other abnormalities of gait and mobility: Secondary | ICD-10-CM | POA: Diagnosis not present

## 2021-07-10 DIAGNOSIS — R29898 Other symptoms and signs involving the musculoskeletal system: Secondary | ICD-10-CM | POA: Diagnosis not present

## 2021-07-10 DIAGNOSIS — M2569 Stiffness of other specified joint, not elsewhere classified: Secondary | ICD-10-CM | POA: Diagnosis not present

## 2021-07-10 DIAGNOSIS — M545 Low back pain, unspecified: Secondary | ICD-10-CM | POA: Diagnosis not present

## 2021-07-10 DIAGNOSIS — G8929 Other chronic pain: Secondary | ICD-10-CM | POA: Diagnosis not present

## 2021-07-15 DIAGNOSIS — M2569 Stiffness of other specified joint, not elsewhere classified: Secondary | ICD-10-CM | POA: Diagnosis not present

## 2021-07-15 DIAGNOSIS — G8929 Other chronic pain: Secondary | ICD-10-CM | POA: Diagnosis not present

## 2021-07-15 DIAGNOSIS — M545 Low back pain, unspecified: Secondary | ICD-10-CM | POA: Diagnosis not present

## 2021-07-15 DIAGNOSIS — R29898 Other symptoms and signs involving the musculoskeletal system: Secondary | ICD-10-CM | POA: Diagnosis not present

## 2021-07-15 DIAGNOSIS — R2689 Other abnormalities of gait and mobility: Secondary | ICD-10-CM | POA: Diagnosis not present

## 2021-07-17 DIAGNOSIS — R29898 Other symptoms and signs involving the musculoskeletal system: Secondary | ICD-10-CM | POA: Diagnosis not present

## 2021-07-17 DIAGNOSIS — R2689 Other abnormalities of gait and mobility: Secondary | ICD-10-CM | POA: Diagnosis not present

## 2021-07-17 DIAGNOSIS — M2569 Stiffness of other specified joint, not elsewhere classified: Secondary | ICD-10-CM | POA: Diagnosis not present

## 2021-07-17 DIAGNOSIS — G8929 Other chronic pain: Secondary | ICD-10-CM | POA: Diagnosis not present

## 2021-07-17 DIAGNOSIS — M545 Low back pain, unspecified: Secondary | ICD-10-CM | POA: Diagnosis not present

## 2021-07-23 DIAGNOSIS — M25652 Stiffness of left hip, not elsewhere classified: Secondary | ICD-10-CM | POA: Diagnosis not present

## 2021-07-23 DIAGNOSIS — M545 Low back pain, unspecified: Secondary | ICD-10-CM | POA: Diagnosis not present

## 2021-07-23 DIAGNOSIS — R2689 Other abnormalities of gait and mobility: Secondary | ICD-10-CM | POA: Diagnosis not present

## 2021-07-23 DIAGNOSIS — G8929 Other chronic pain: Secondary | ICD-10-CM | POA: Diagnosis not present

## 2021-07-23 DIAGNOSIS — M25552 Pain in left hip: Secondary | ICD-10-CM | POA: Diagnosis not present

## 2021-07-23 DIAGNOSIS — M2569 Stiffness of other specified joint, not elsewhere classified: Secondary | ICD-10-CM | POA: Diagnosis not present

## 2021-07-24 DIAGNOSIS — R2689 Other abnormalities of gait and mobility: Secondary | ICD-10-CM | POA: Diagnosis not present

## 2021-07-24 DIAGNOSIS — M545 Low back pain, unspecified: Secondary | ICD-10-CM | POA: Diagnosis not present

## 2021-07-24 DIAGNOSIS — M25552 Pain in left hip: Secondary | ICD-10-CM | POA: Diagnosis not present

## 2021-07-24 DIAGNOSIS — G8929 Other chronic pain: Secondary | ICD-10-CM | POA: Diagnosis not present

## 2021-07-24 DIAGNOSIS — M2569 Stiffness of other specified joint, not elsewhere classified: Secondary | ICD-10-CM | POA: Diagnosis not present

## 2021-07-24 DIAGNOSIS — M25652 Stiffness of left hip, not elsewhere classified: Secondary | ICD-10-CM | POA: Diagnosis not present

## 2021-07-29 DIAGNOSIS — M25552 Pain in left hip: Secondary | ICD-10-CM | POA: Diagnosis not present

## 2021-07-29 DIAGNOSIS — M545 Low back pain, unspecified: Secondary | ICD-10-CM | POA: Diagnosis not present

## 2021-07-29 DIAGNOSIS — R2689 Other abnormalities of gait and mobility: Secondary | ICD-10-CM | POA: Diagnosis not present

## 2021-07-29 DIAGNOSIS — G8929 Other chronic pain: Secondary | ICD-10-CM | POA: Diagnosis not present

## 2021-07-29 DIAGNOSIS — M25652 Stiffness of left hip, not elsewhere classified: Secondary | ICD-10-CM | POA: Diagnosis not present

## 2021-07-29 DIAGNOSIS — M2569 Stiffness of other specified joint, not elsewhere classified: Secondary | ICD-10-CM | POA: Diagnosis not present

## 2021-07-31 DIAGNOSIS — M545 Low back pain, unspecified: Secondary | ICD-10-CM | POA: Diagnosis not present

## 2021-07-31 DIAGNOSIS — M2569 Stiffness of other specified joint, not elsewhere classified: Secondary | ICD-10-CM | POA: Diagnosis not present

## 2021-07-31 DIAGNOSIS — M25552 Pain in left hip: Secondary | ICD-10-CM | POA: Diagnosis not present

## 2021-07-31 DIAGNOSIS — G8929 Other chronic pain: Secondary | ICD-10-CM | POA: Diagnosis not present

## 2021-07-31 DIAGNOSIS — M25652 Stiffness of left hip, not elsewhere classified: Secondary | ICD-10-CM | POA: Diagnosis not present

## 2021-07-31 DIAGNOSIS — R2689 Other abnormalities of gait and mobility: Secondary | ICD-10-CM | POA: Diagnosis not present

## 2021-08-11 ENCOUNTER — Other Ambulatory Visit: Payer: Self-pay | Admitting: Nurse Practitioner

## 2021-08-11 ENCOUNTER — Telehealth: Payer: Self-pay | Admitting: Nurse Practitioner

## 2021-08-11 DIAGNOSIS — E890 Postprocedural hypothyroidism: Secondary | ICD-10-CM

## 2021-08-11 MED ORDER — LEVOTHYROXINE SODIUM 75 MCG PO TABS: ORAL_TABLET | ORAL | 0 refills | Status: DC

## 2021-08-11 NOTE — Telephone Encounter (Signed)
Pt medication was sent to [facility] mail service today she states she will be out before they can get it to her. She still wants that RX but is also wanting a 30 day supply sent locally.  synthroid  [facility] [place], [place] Phone:  [phone]  Fax:  [phone]

## 2021-08-14 DIAGNOSIS — M25552 Pain in left hip: Secondary | ICD-10-CM | POA: Diagnosis not present

## 2021-08-14 DIAGNOSIS — R2689 Other abnormalities of gait and mobility: Secondary | ICD-10-CM | POA: Diagnosis not present

## 2021-08-14 DIAGNOSIS — M2569 Stiffness of other specified joint, not elsewhere classified: Secondary | ICD-10-CM | POA: Diagnosis not present

## 2021-08-14 DIAGNOSIS — M25652 Stiffness of left hip, not elsewhere classified: Secondary | ICD-10-CM | POA: Diagnosis not present

## 2021-08-14 DIAGNOSIS — M545 Low back pain, unspecified: Secondary | ICD-10-CM | POA: Diagnosis not present

## 2021-08-14 DIAGNOSIS — G8929 Other chronic pain: Secondary | ICD-10-CM | POA: Diagnosis not present

## 2021-08-27 ENCOUNTER — Other Ambulatory Visit: Payer: Self-pay

## 2021-08-27 ENCOUNTER — Encounter: Payer: Self-pay | Admitting: Urology

## 2021-08-27 ENCOUNTER — Ambulatory Visit (INDEPENDENT_AMBULATORY_CARE_PROVIDER_SITE_OTHER): Payer: Medicare Other | Admitting: Urology

## 2021-08-27 VITALS — BP 145/70 | HR 96

## 2021-08-27 DIAGNOSIS — R3915 Urgency of urination: Secondary | ICD-10-CM

## 2021-08-27 DIAGNOSIS — N3281 Overactive bladder: Secondary | ICD-10-CM | POA: Diagnosis not present

## 2021-08-27 LAB — URINALYSIS, ROUTINE W REFLEX MICROSCOPIC
Bilirubin, UA: NEGATIVE
Ketones, UA: NEGATIVE
Leukocytes,UA: NEGATIVE
Nitrite, UA: NEGATIVE
Protein,UA: NEGATIVE
RBC, UA: NEGATIVE
Specific Gravity, UA: 1.02 (ref 1.005–1.030)
Urobilinogen, Ur: 0.2 mg/dL (ref 0.2–1.0)
pH, UA: 5 (ref 5.0–7.5)

## 2021-08-27 MED ORDER — MIRABEGRON ER 50 MG PO TB24: 50.0000 mg | ORAL_TABLET | Freq: Every day | ORAL | 11 refills | Status: DC

## 2021-08-27 NOTE — Progress Notes (Signed)
08/27/2021 1:42 PM   Andrea Shannon February 12, 1946 [id number]  Referring provider: Monico Blitz, [address] [place],  [place] [address]  Followup OAB and pelvic pain   HPI: Andrea Shannon is a 75yo here for followup for OAB and pelvic pain. She is currently on mirabegron 50mg  for her OAB. It has significantly improved her urinary urgency, urge incontinence, and pelvic pressure. She notes intermittent pelvic pressure/pain that is worse with constipation. Urine stream strong. No straining to urinate. No other complaints today   PMH: Past Medical History:  Diagnosis Date   Acid reflux    Arthritis    Diabetes mellitus, type II ([place])    Hypertension    Hyperthyroidism    Hypothyroidism     Surgical History: Past Surgical History:  Procedure Laterality Date   ABDOMINAL HYSTERECTOMY      Home Medications:  Allergies as of 08/27/2021   No Known Allergies      Medication List        Accurate as of August 27, 2021  1:42 PM. If you have any questions, ask your nurse or doctor.          aspirin 81 MG chewable tablet Chew by mouth daily.   diclofenac 75 MG EC tablet Commonly known as: VOLTAREN Take 75 mg by mouth daily.   Gemtesa 75 MG Tabs Generic drug: Vibegron Take by mouth.   latanoprost 0.005 % ophthalmic solution Commonly known as: XALATAN Apply to eye.   levothyroxine 75 MCG tablet Commonly known as: Synthroid TAKE ONE TABLET BY MOUTH EVERY DAY BEFORE BREAKFAST   lisinopril-hydrochlorothiazide 20-12.5 MG tablet Commonly known as: ZESTORETIC Take 1 tablet by mouth daily.   mirabegron ER 50 MG Tb24 tablet Commonly known as: MYRBETRIQ Take 1 tablet (50 mg total) by mouth daily.   omeprazole 40 MG capsule Commonly known as: PRILOSEC Take 40 mg by mouth daily.        Allergies: No Known Allergies  Family History: Family History  Problem Relation Age of Onset   Diabetes Mother     Social History:  reports that she has never smoked. She has  never used smokeless tobacco. She reports that she does not currently use alcohol. She reports that she does not use drugs.  ROS: All other review of systems were reviewed and are negative except what is noted above in HPI  Physical Exam: BP (!) 145/70   Pulse 96   Constitutional:  Alert and oriented, No acute distress. HEENT:  AT, moist mucus membranes.  Trachea midline, no masses. Cardiovascular: No clubbing, cyanosis, or edema. Respiratory: Normal respiratory effort, no increased work of breathing. GI: Abdomen is soft, nontender, nondistended, no abdominal masses GU: No CVA tenderness.  Lymph: No cervical or inguinal lymphadenopathy. Skin: No rashes, bruises or suspicious lesions. Neurologic: Grossly intact, no focal deficits, moving all 4 extremities. Psychiatric: Normal mood and affect.  Laboratory Data: No results found for: WBC, HGB, HCT, MCV, PLT  No results found for: CREATININE  No results found for: PSA  No results found for: TESTOSTERONE  No results found for: HGBA1C  Urinalysis    Component Value Date/Time   APPEARANCEUR Clear 08/28/2020 1349   GLUCOSEU Negative 08/28/2020 1349   BILIRUBINUR Negative 08/28/2020 1349   PROTEINUR Negative 08/28/2020 1349   UROBILINOGEN negative (A) 04/15/2020 1507   NITRITE Negative 08/28/2020 1349   LEUKOCYTESUR Trace (A) 08/28/2020 1349    Lab Results  Component Value Date   LABMICR See below: 08/28/2020   [facility]  0-5 08/28/2020   LABEPIT >10 (A) 08/28/2020   BACTERIA Many (A) 08/28/2020    Pertinent Imaging:  No results found for this or any previous visit.  No results found for this or any previous visit.  No results found for this or any previous visit.  No results found for this or any previous visit.  No results found for this or any previous visit.  No results found for this or any previous visit.  No results found for this or any previous visit.  No results found for this or any previous  visit.   Assessment & Plan:    1. Pelvic pain -continue mirabegron 50mg  daily - Urinalysis, Routine w reflex microscopic  2. OAB (overactive bladder) -Continue mirabegron 50mg  daily   No follow-ups on file.  Nicolette Bang, MD  [facility] [place]

## 2021-08-27 NOTE — Progress Notes (Signed)
Urological Symptom Review  Patient is experiencing the following symptoms: none   Review of Systems  Gastrointestinal (upper)  : Negative for upper GI symptoms  Gastrointestinal (lower) : Negative for lower GI symptoms  Constitutional : Night Sweats  Skin: Negative for skin symptoms  Eyes: Negative for eye symptoms  Ear/Nose/Throat : Sinus problems  Hematologic/Lymphatic: Negative for Hematologic/Lymphatic symptoms  Cardiovascular : Negative for cardiovascular symptoms  Respiratory : Negative for respiratory symptoms  Endocrine: Negative for endocrine symptoms  Musculoskeletal: Back pain Joint pain  Neurological: Negative for neurological symptoms  Psychologic: Negative for psychiatric symptoms

## 2021-08-27 NOTE — Patient Instructions (Signed)

## 2021-09-03 DIAGNOSIS — H524 Presbyopia: Secondary | ICD-10-CM | POA: Diagnosis not present

## 2021-09-03 DIAGNOSIS — H401131 Primary open-angle glaucoma, bilateral, mild stage: Secondary | ICD-10-CM | POA: Diagnosis not present

## 2021-09-03 DIAGNOSIS — E119 Type 2 diabetes mellitus without complications: Secondary | ICD-10-CM | POA: Diagnosis not present

## 2021-09-03 DIAGNOSIS — H43812 Vitreous degeneration, left eye: Secondary | ICD-10-CM | POA: Diagnosis not present

## 2021-09-03 DIAGNOSIS — H04123 Dry eye syndrome of bilateral lacrimal glands: Secondary | ICD-10-CM | POA: Diagnosis not present

## 2021-09-03 DIAGNOSIS — H5203 Hypermetropia, bilateral: Secondary | ICD-10-CM | POA: Diagnosis not present

## 2021-09-03 DIAGNOSIS — H52223 Regular astigmatism, bilateral: Secondary | ICD-10-CM | POA: Diagnosis not present

## 2021-09-10 DIAGNOSIS — E890 Postprocedural hypothyroidism: Secondary | ICD-10-CM | POA: Diagnosis not present

## 2021-09-10 LAB — TSH: TSH: 0.18 — AB (ref 0.41–5.90)

## 2021-09-17 DIAGNOSIS — I10 Essential (primary) hypertension: Secondary | ICD-10-CM | POA: Diagnosis not present

## 2021-09-17 DIAGNOSIS — Z299 Encounter for prophylactic measures, unspecified: Secondary | ICD-10-CM | POA: Diagnosis not present

## 2021-09-17 DIAGNOSIS — E1165 Type 2 diabetes mellitus with hyperglycemia: Secondary | ICD-10-CM | POA: Diagnosis not present

## 2021-09-17 DIAGNOSIS — L720 Epidermal cyst: Secondary | ICD-10-CM | POA: Diagnosis not present

## 2021-09-17 DIAGNOSIS — E1143 Type 2 diabetes mellitus with diabetic autonomic (poly)neuropathy: Secondary | ICD-10-CM | POA: Diagnosis not present

## 2021-09-17 DIAGNOSIS — E1142 Type 2 diabetes mellitus with diabetic polyneuropathy: Secondary | ICD-10-CM | POA: Diagnosis not present

## 2021-09-17 DIAGNOSIS — R42 Dizziness and giddiness: Secondary | ICD-10-CM | POA: Diagnosis not present

## 2021-09-17 NOTE — Patient Instructions (Signed)

## 2021-09-18 ENCOUNTER — Encounter: Payer: Self-pay | Admitting: Nurse Practitioner

## 2021-09-18 ENCOUNTER — Ambulatory Visit (INDEPENDENT_AMBULATORY_CARE_PROVIDER_SITE_OTHER): Payer: Medicare Other | Admitting: Nurse Practitioner

## 2021-09-18 ENCOUNTER — Other Ambulatory Visit: Payer: Self-pay

## 2021-09-18 VITALS — BP 130/78 | HR 77 | Ht 63.0 in | Wt 159.0 lb

## 2021-09-18 DIAGNOSIS — E559 Vitamin D deficiency, unspecified: Secondary | ICD-10-CM | POA: Diagnosis not present

## 2021-09-18 DIAGNOSIS — E890 Postprocedural hypothyroidism: Secondary | ICD-10-CM | POA: Diagnosis not present

## 2021-09-18 NOTE — Progress Notes (Signed)
09/18/2021, 11:09 AM     Endocrinology follow-up note    Subjective:    Patient ID: Andrea Shannon, female    DOB: [birth year], PCP Monico Blitz, MD   Past Medical History:  Diagnosis Date   Acid reflux    Arthritis    Diabetes mellitus, type II ([place])    Hypertension    Hyperthyroidism    Hypothyroidism    Past Surgical History:  Procedure Laterality Date   ABDOMINAL HYSTERECTOMY     Social History   Socioeconomic History   Marital status: Widowed    Spouse name: Not on file   Number of children: 1   Years of education: Not on file   Highest education level: Not on file  Occupational History   Not on file  Tobacco Use   Smoking status: Never   Smokeless tobacco: Never  Vaping Use   Vaping Use: Never used  Substance and Sexual Activity   Alcohol use: Not Currently   Drug use: Never   Sexual activity: Not on file  Other Topics Concern   Not on file  Social History Narrative   Not on file   Social Determinants of Health   Financial Resource Strain: Not on file  Food Insecurity: Not on file  Transportation Needs: Not on file  Physical Activity: Not on file  Stress: Not on file  Social Connections: Not on file   Outpatient Encounter Medications as of 09/18/2021  Medication Sig   aspirin 81 MG chewable tablet Chew by mouth daily.   diclofenac (VOLTAREN) 75 MG EC tablet Take 75 mg by mouth daily.   latanoprost (XALATAN) 0.005 % ophthalmic solution Apply to eye.   levothyroxine (SYNTHROID) 75 MCG tablet TAKE ONE TABLET BY MOUTH EVERY DAY BEFORE BREAKFAST   lisinopril-hydrochlorothiazide (PRINZIDE,ZESTORETIC) 20-12.5 MG tablet Take 1 tablet by mouth daily.   mirabegron ER (MYRBETRIQ) 50 MG TB24 tablet Take 1 tablet (50 mg total) by mouth daily.   omeprazole (PRILOSEC) 40 MG capsule Take 40 mg by mouth daily.   [DISCONTINUED] Vibegron (GEMTESA) 75 MG TABS Take by mouth. (Patient not taking: Reported on  08/27/2021)   No facility-administered encounter medications on file as of 09/18/2021.   ALLERGIES: No Known Allergies  VACCINATION STATUS:  There is no immunization history on file for this patient.  Thyroid Problem Presents for follow-up (-She was treated with I-131 on September 29, 2019 for toxic multinodular goiter.   She reports family history of thyroid dysfunction, family history of thyroid malignancy.) visit. Symptoms include heat intolerance. Patient reports no anxiety, cold intolerance, constipation, depressed mood, diarrhea, fatigue, leg swelling, palpitations, tremors, weight gain or weight loss. The symptoms have been stable.    Andrea Shannon is 75 y.o. female who presents today with a medical history as above.  Review of systems  Constitutional: + stable body weight  current Body mass index is 28.17 kg/m. , no fatigue, + subjective hyperthermia-intermittent, no subjective hypothermia Eyes: no blurry vision, no xerophthalmia ENT: no sore throat, no nodules palpated in throat, no dysphagia/odynophagia, no hoarseness Cardiovascular: no chest pain, no shortness of breath, no palpitations, no leg swelling Respiratory: no cough, no shortness of breath Gastrointestinal: no nausea/vomiting/diarrhea Musculoskeletal: no muscle/joint aches Skin:  no rashes, no hyperemia Neurological: no tremors, no numbness, no tingling, no dizziness Psychiatric: no depression, no anxiety  Objective:    BP 130/78   Pulse 77   Ht 5\' 3"  (1.6 m)   Wt 159 lb (72.1 kg)   BMI 28.17 kg/m   Wt Readings from Last 3 Encounters:  09/18/21 159 lb (72.1 kg)  03/19/21 154 lb (69.9 kg)  12/13/20 159 lb (72.1 kg)     BP Readings from Last 3 Encounters:  09/18/21 130/78  08/27/21 (!) 145/70  03/19/21 134/75    Physical Exam- Limited  Constitutional:  Body mass index is 28.17 kg/m. , not in acute distress, normal state of mind Eyes:  EOMI, no exophthalmos Neck: Supple Cardiovascular: RRR, no  murmurs, rubs, or gallops, no edema Respiratory: Adequate breathing efforts, no crackles, rales, rhonchi, or wheezing Musculoskeletal: no gross deformities, strength intact in all four extremities, no gross restriction of joint movements Skin:  no rashes, no hyperemia Neurological: no tremor with outstretched hands   Recent Results (from the past 2160 hour(s))  Urinalysis, Routine w reflex microscopic     Status: Abnormal   Collection Time: 08/27/21  1:31 PM  Result Value Ref Range   Specific Gravity, UA 1.020 1.005 - 1.030   pH, UA 5.0 5.0 - 7.5   Color, UA Yellow Yellow   Appearance Ur Clear Clear   Leukocytes,UA Negative Negative   Protein,UA Negative Negative/Trace   Glucose, UA Trace (A) Negative   Ketones, UA Negative Negative   RBC, UA Negative Negative   Bilirubin, UA Negative Negative   Urobilinogen, Ur 0.2 0.2 - 1.0 mg/dL   Nitrite, UA Negative Negative   Microscopic Examination Comment     Comment: Microscopic follows if indicated.  TSH     Status: Abnormal   Collection Time: 09/10/21 12:00 AM  Result Value Ref Range   TSH 0.18 (A) 0.41 - 5.90    Comment: Free T4 1.13   Ultrasound of the thyroid on June 27, 2018 showed right lobe measuring 4.9 x 1.9 x 2.1 cm with 1 dominant mixed cystic and solid nodule measuring 2.2 x 1.5 x 1.8 cm.  Another nodule in the superior right lobe measuring 1.9 x 0.9 x 1.2 cm. Left lobe heterogeneous measuring 4.7 x 3.2 x 2.3 cm with dominant mixed cystic and solid nodule in the central left lobe measuring 2.6 x 2.4 x 2.3 cm.  Separate mixed cystic and solid nodule in the superior left lobe measuring 1.7 x 1.3 x 1.4 cm. Impression:  innumerable thyroid nodules, increasing probability of benignity.  2 suspicious right thyroid nodule measuring 1.9 x 0.9 x 1.2 cm in the superior right lobe.  Ultrasound-guided FNA is suggested.  More suspicious left thyroid nodule in the superior left lobe measuring 2.6 x 2.4 x 2.3 cm.  Ultrasound-guided FNA  suggested.   Fine-needle aspiration on November 30, 2018 revealed atypia of undetermined significance,  afirma testing-reported benign, with malignancy risk less than 4%.     Thyroid uptake and scan on September 04, 2019 showed enlarged thyroid gland with several focal areas of increased uptake, likely representing nodules.  No areas of photopenia.  24-hour uptake 35%.   Results for CODI, FOLKERTS (MRN [id number]) as of 08/12/2020 10:53  Ref. Range 10/19/2018 00:00 04/11/2019 00:00 08/14/2019 00:00 11/20/2019 00:00 01/22/2020 00:00  TSH Latest Ref Range: 0.41 - 5.90  0.48 1.26 0.01 (A) 0.01 (A) 0.04 (A)   US Thyroid 11/15/2018 Narrative & Impression  CLINICAL DATA:  Multinodular goiter   EXAM: THYROID ULTRASOUND   TECHNIQUE: Ultrasound examination of the thyroid gland and adjacent soft tissues was performed.   COMPARISON:  06/27/2018 by report only from [facility]   FINDINGS: Parenchymal Echotexture: Moderately heterogenous   Isthmus: 0.3 cm thickness, stable   Right lobe: 5.6 x 2.3 x 2 cm, previously 4.9 x 1.9 x 2.1   Left lobe: 6.1 x 3.3 x 2.3 cm, previously 4.7 x 3.2 x 2.3   _________________________________________________________   Estimated total number of nodules >/= 1 cm: 6-10   Number of spongiform nodules >/=  2 cm not described below (TR1): 0   Number of mixed cystic and solid nodules >/= 1.5 cm not described below ([place]): 0   _________________________________________________________   Nodule # 1:   Location: Right; Superior   Maximum size: 1.4 cm; Other 2 dimensions: 1.1 x 1 cm   Composition: mixed cystic and solid (1)   Echogenicity: hypoechoic (2)   Shape: not taller-than-wide (0)   Margins: ill-defined (0)   Echogenic foci: none (0)   ACR TI-RADS total points: 3.   ACR TI-RADS risk category: TR3 (3 points).   ACR TI-RADS recommendations:   Given size (<1.4 cm) and appearance, this nodule does NOT meet TI-RADS criteria for biopsy  or dedicated follow-up.   _________________________________________________________   Nodule # 2: 2.7 x 1.9 x 2 cm spongiform nodule, mid right; This nodule does NOT meet TI-RADS criteria for biopsy or dedicated follow-up.   Nodule # 3: 1.5 x 0.9 x 1.2 cm spongiform nodule, inferior right; This nodule does NOT meet TI-RADS criteria for biopsy or dedicated follow-up.   Nodule # 4:   Location: Left; Superior   Maximum size: 2 cm; Other 2 dimensions: 1.7 x 1.7 cm   Composition: mixed cystic and solid (1)   Echogenicity: hypoechoic (2)   Shape: not taller-than-wide (0)   Margins: smooth (0)   Echogenic foci: none (0)   ACR TI-RADS total points: 3.   ACR TI-RADS risk category: TR3 (3 points).   ACR TI-RADS recommendations:   *Given size (>/= 1.5 - 2.4 cm) and appearance, a follow-up ultrasound in 1 year should be considered based on TI-RADS criteria.   _________________________________________________________   Nodule # 5:   Location: Left; Mid   Maximum size: 3 cm; Other 2 dimensions: 2.8 x 1.9 cm   Composition: mixed cystic and solid (1)   Echogenicity: isoechoic (1)   Shape: not taller-than-wide (0)   Margins: smooth (0)   Echogenic foci: none (0)   ACR TI-RADS total points: 2.   ACR TI-RADS risk category: TR2 (2 points).   ACR TI-RADS recommendations:   This nodule does NOT meet TI-RADS criteria for biopsy or dedicated follow-up.   _________________________________________________________   Nodule # 6:   Location: Left; Inferior   Maximum size: 3.2 cm; Other 2 dimensions: 2.7 x 1.9 cm   Composition: mixed cystic and solid (1)   Echogenicity: isoechoic (1)   Shape: not taller-than-wide (0)   Margins: smooth (0)   Echogenic foci: none (0)   ACR TI-RADS total points: 2.   ACR TI-RADS risk category: TR2 (2 points).   ACR TI-RADS recommendations:   This nodule does NOT meet TI-RADS criteria for biopsy or dedicated follow-up.    _________________________________________________________   Nodule # 7:   Location: Left; Inferior posterior   Maximum size: 3 cm; Other 2 dimensions: 2.9 x 2.3 cm   Composition: solid/almost completely solid (2)   Echogenicity: isoechoic (  1)   Shape: taller-than-wide (3)   Margins: ill-defined (0)   Echogenic foci: none (0)   ACR TI-RADS total points: 6.   ACR TI-RADS risk category: TR4 (4-6 points).   ACR TI-RADS recommendations:   **Given size (>/= 1.5 cm) and appearance, fine needle aspiration of this moderately suspicious nodule should be considered based on TI-RADS criteria.   IMPRESSION: 1. Thyromegaly with bilateral nodules. 2. Recommend FNA biopsy of moderately suspicious 3 cm inferior posterior nodule #7; This was not described on the prior study. 3. Recommend annual/biennial ultrasound follow-up of additional lesion as above, until stability x5 years confirmed.   -------------------------------------------------------------------------------------------------------------------------------  FOLLOW UP THYROID US FROM 12/02/20 CLINICAL DATA:  Hypothyroidism. Left inferior thyroid nodule biopsy on 11/30/2018.   EXAM: THYROID ULTRASOUND   TECHNIQUE: Ultrasound examination of the thyroid gland and adjacent soft tissues was performed.   COMPARISON:  11/15/2018   FINDINGS: Parenchymal Echotexture: Moderately heterogenous   Isthmus: 0.2 cm, previously 0.3 cm   Right lobe: 4.9 x 1.7 x 1.6 cm, previously 5.6 x 2.3 x 2.0 cm   Left lobe: 5.1 x 2.2 x 1.7 cm, previously 6.1 x 2.8 x 2.3 cm   _________________________________________________________   Estimated total number of nodules >/= 1 cm: 5   Number of spongiform nodules >/=  2 cm not described below (TR1): 0   Number of mixed cystic and solid nodules >/= 1.5 cm not described below ([place]): 0   _________________________________________________________   Again noted are multiple right thyroid  nodules. The right thyroid nodules are predominantly cystic or spongiform.   Nodule 2 in the right mid thyroid lobe has markedly decreased in size. This is a spongiform or mixed cystic and solid nodule that measures 1.3 x 0.9 x 0.9 cm and previously measured 2.7 x 1.9 x 2.0 cm.   Nodule 3 in the right inferior thyroid is mixed cystic and solid nodule that measures roughly 1.5 x 1.0 x 1.2 cm and previously measured 1.3 x 1.2 x 0.9 cm.   Multiple left thyroid nodules that are predominantly mixed cystic and solid nodules.   Nodule 5 in the left mid thyroid lobe measures 1.3 x 0.5 x 0.5 cm and previously measured 2.8 x 1.9 x 3.0 cm. This is a mixed cystic and solid nodule and the solid portion is isoechoic.   Nodule 6 is a mixed cystic and solid nodule in the left inferior thyroid lobe measures 1.6 x 0.9 x 1.5 cm and previously measured 2.7 x 1.9 x 3.2 cm.   Nodule 7 in the left inferior thyroid lobe represents the previously biopsied nodule. This nodule is slightly hypoechoic and has scattered cystic areas. This nodule measures 2.1 x 2.0 x 1.5 cm and previously measured 3.0 x 2.9 x 2.3 cm.   IMPRESSION: 1. Multinodular goiter. 2. Most of the nodules are mixed cystic and solid composition. Many of the nodules have decreased in size as described. 3. No new suspicious thyroid nodules.   The above is in keeping with the ACR TI-RADS recommendations - J Am Coll Radiol 2017;14:587-595.     Electronically Signed   By: Markus Daft M.D.   On: 12/02/2020 14:20  Assessment & Plan:   1. RAI - induced hypothyroidism -She status post I-131 thyroid ablation on September 29, 2019.    -Her previsit TFTs are consistent with appropriate hormone replacement (her TSH is always mildly suppressed but Free T4 is stable).  She is advised to continue Levothyroxine 75 mcg po daily before breakfast.    -  We discussed about the correct intake of her thyroid hormone, on empty stomach at fasting, with  water, separated by at least 30 minutes from breakfast and other medications,  and separated by more than 4 hours from calcium, iron, multivitamins, acid reflux medications (PPIs). -Patient is made aware of the fact that thyroid hormone replacement is needed for life, dose to be adjusted by periodic monitoring of thyroid function tests.  2.  Multinodular goiter -Her fine-needle aspiration was significant for atypia of undetermined significance, a sample was sent for afirma testing-with grossly benign finding with malignancy is less than 4%.  She will not need surgical intervention at this time.    -Her thyroid US from 10/2018 shows multiple nodules which recommended follow up with [place] in 1 year. Her repeat thyroid US from 11/2020 shows that all nodules have decreased in size and there are no new nodules.  No additional surveillance recommended at this time.    - I advised her  to maintain close follow up with Monico Blitz, MD for primary care needs.      I spent 20 minutes in the care of the patient today including review of labs from Thyroid Function, CMP, and other relevant labs ; imaging/biopsy records (current and previous including abstractions from other facilities); face-to-face time discussing  her lab results and symptoms, medications doses, her options of short and long term treatment based on the latest standards of care / guidelines;   and documenting the encounter.  Eli Phillips  participated in the discussions, expressed understanding, and voiced agreement with the above plans.  All questions were answered to her satisfaction. she is encouraged to contact clinic should she have any questions or concerns prior to her return visit.  Follow up plan: Return in about 1 year (around 09/18/2022) for Thyroid follow up, Previsit labs.   Rayetta Pigg, [facility] [address] [place], [place] [address] Phone: [phone] Fax:  [phone]   09/18/2021, 11:09 AM

## 2021-09-29 DIAGNOSIS — E039 Hypothyroidism, unspecified: Secondary | ICD-10-CM | POA: Diagnosis not present

## 2021-09-29 DIAGNOSIS — Z299 Encounter for prophylactic measures, unspecified: Secondary | ICD-10-CM | POA: Diagnosis not present

## 2021-09-29 DIAGNOSIS — I10 Essential (primary) hypertension: Secondary | ICD-10-CM | POA: Diagnosis not present

## 2021-09-29 DIAGNOSIS — K219 Gastro-esophageal reflux disease without esophagitis: Secondary | ICD-10-CM | POA: Diagnosis not present

## 2021-09-29 DIAGNOSIS — Z2821 Immunization not carried out because of patient refusal: Secondary | ICD-10-CM | POA: Diagnosis not present

## 2021-09-29 DIAGNOSIS — R079 Chest pain, unspecified: Secondary | ICD-10-CM | POA: Diagnosis not present

## 2021-10-21 ENCOUNTER — Encounter: Payer: Self-pay | Admitting: Nurse Practitioner

## 2021-10-21 ENCOUNTER — Ambulatory Visit (INDEPENDENT_AMBULATORY_CARE_PROVIDER_SITE_OTHER): Payer: Medicare Other | Admitting: Nurse Practitioner

## 2021-10-21 VITALS — BP 120/64 | HR 79 | Temp 97.1°F | Ht 63.0 in | Wt 158.1 lb

## 2021-10-21 DIAGNOSIS — E119 Type 2 diabetes mellitus without complications: Secondary | ICD-10-CM

## 2021-10-21 DIAGNOSIS — Z7689 Persons encountering health services in other specified circumstances: Secondary | ICD-10-CM | POA: Diagnosis not present

## 2021-10-21 DIAGNOSIS — E118 Type 2 diabetes mellitus with unspecified complications: Secondary | ICD-10-CM | POA: Insufficient documentation

## 2021-10-21 DIAGNOSIS — I152 Hypertension secondary to endocrine disorders: Secondary | ICD-10-CM | POA: Diagnosis not present

## 2021-10-21 DIAGNOSIS — E1159 Type 2 diabetes mellitus with other circulatory complications: Secondary | ICD-10-CM

## 2021-10-21 DIAGNOSIS — Z0000 Encounter for general adult medical examination without abnormal findings: Secondary | ICD-10-CM | POA: Insufficient documentation

## 2021-10-21 LAB — BAYER DCA HB A1C WAIVED: HB A1C (BAYER DCA - WAIVED): 7.1 % — ABNORMAL HIGH (ref 4.8–5.6)

## 2021-10-21 MED ORDER — METFORMIN HCL 500 MG PO TABS: 500.0000 mg | ORAL_TABLET | Freq: Two times a day (BID) | ORAL | 0 refills | Status: DC

## 2021-10-21 NOTE — Patient Instructions (Signed)
Hyperglycemia Hyperglycemia is when the sugar (glucose) level in your blood is too high. High blood sugar can happen to people who have or do not have diabetes. High blood sugar can happen quickly. It can be an emergency. What are the causes? If you have diabetes, high blood sugar may be caused by: Medicines that increase blood sugar or affect your control of diabetes. Getting less physical activity. Overeating. Being sick or injured or having an infection. Having surgery. Stress. Not giving yourself enough insulin (if you are taking it). You may have high blood sugar because you have diabetes that has not been diagnosed yet. If you do not have diabetes, high blood sugar may be caused by: Certain medicines. Stress. A bad illness. An infection. Having surgery. Diseases of the pancreas. What increases the risk? This condition is more likely to develop in people who have risk factors for diabetes, such as: Having a family member with diabetes. Certain conditions in which the body's defense system (immune system) attacks itself. These are called autoimmune disorders. Being overweight. Not being active. Having a condition called insulin resistance. Having a history of: Prediabetes. Diabetes when pregnant. Polycystic ovarian syndrome (PCOS). What are the signs or symptoms? This condition may not cause symptoms. If you do have symptoms, they may include: Feeling more thirsty than normal. Needing to pee (urinate) more often than normal. Hunger. Feeling very tired. Blurry eyesight (vision). You may get other symptoms as the condition gets worse, such as: Dry mouth. Pain in your belly (abdomen). Not being hungry (loss of appetite). Breath that smells fruity. Weakness. Weight loss that is not planned. A tingling or numb feeling in your hands or feet. A headache. Cuts or bruises that heal slowly. How is this treated? Treatment depends on the cause of your condition. Treatment may  include: Taking medicine to control your blood sugar levels. Changing your medicine or dosage if you take insulin or other diabetes medicines. Lifestyle changes. These may include: Exercising more. Eating healthier foods. Losing weight. Treating an illness or infection. Checking your blood sugar more often. Stopping or reducing steroid medicines. If your condition gets very bad, you will need to be treated in the hospital. Follow these instructions at home: General instructions Take over-the-counter and prescription medicines only as told by your doctor. Do not smoke or use any products that contain nicotine or tobacco. If you need help quitting, ask your doctor. If you drink alcohol: Limit how much you have to: 0-1 drink a day for women who are not pregnant. 0-2 drinks a day for men. Know how much alcohol is in a drink. In the [place] one drink equals one 12 oz bottle of beer (355 mL), one 5 oz glass of wine (148 mL), or one 1 oz glass of hard liquor (44 mL). Manage stress. If you need help with this, ask your doctor. Do exercises as told by your doctor. Keep all follow-up visits. Eating and drinking  Stay at a healthy weight. Make sure you drink enough fluid when you: Exercise. Get sick. Are in hot temperatures. Drink enough fluid to keep your pee (urine) pale yellow. If you have diabetes:  Know the symptoms of high blood sugar. Follow your diabetes management plan as told by your doctor. Make sure you: Take insulin and medicines as told. Follow your exercise plan. Follow your meal plan. Eat on time. Do not skip meals. Check your blood sugar as often as told. Make sure you check before and after exercise. If you  exercise longer or in a different way, check your blood sugar more often. Follow your sick day plan whenever you cannot eat or drink normally. Make this plan ahead of time with your doctor. Share your diabetes management plan with people in your workplace, school,  and household. Check your pee for ketones when you are ill and as told by your doctor. Carry a card or wear jewelry that says that you have diabetes. Where to find more information American Diabetes Association: www.diabetes.org Contact a doctor if: Your blood sugar level is at or above 240 mg/dL (13.3 mmol/L) for 2 days in a row. You have problems keeping your blood sugar in your target range. You have high blood pressure often. You have signs of illness, such as: Feeling like you may vomit (feeling nauseous). Vomiting. A fever. Get help right away if: Your blood sugar monitor reads "high" even when you are taking insulin. You have trouble breathing. You have a change in how you think, feel, or act (mental status). You feel like you may vomit, and the feeling does not go away. You cannot stop vomiting. These symptoms may be an emergency. Get medical help right away. Call your local emergency services (911 in the [place] Do not wait to see if the symptoms will go away. Do not drive yourself to the hospital. Summary Hyperglycemia is when the sugar (glucose) level in your blood is too high. High blood sugar can happen to people who have or do not have diabetes. Make sure you drink enough fluids and follow your meal plan. Exercise as often as told by your doctor. Contact your doctor if you have problems keeping your blood sugar in your target range. This information is not intended to replace advice given to you by your health care provider. Make sure you discuss any questions you have with your health care provider. Document Revised: 07/19/2020 Document Reviewed: 07/19/2020 Elsevier Patient Education  2022 [employer]. Hypertension, Adult High blood pressure (hypertension) is when the force of blood pumping through the arteries is too strong. The arteries are the blood vessels that carry blood from the heart throughout the body. Hypertension forces the heart to work harder to pump blood  and may cause arteries to become narrow or stiff. Untreated or uncontrolled hypertension can cause a heart attack, heart failure, a stroke, kidney disease, and other problems. A blood pressure reading consists of a higher number over a lower number. Ideally, your blood pressure should be below 120/80. The first ("top") number is called the systolic pressure. It is a measure of the pressure in your arteries as your heart beats. The second ("bottom") number is called the diastolic pressure. It is a measure of the pressure in your arteries as the heart relaxes. What are the causes? The exact cause of this condition is not known. There are some conditions that result in or are related to high blood pressure. What increases the risk? Some risk factors for high blood pressure are under your control. The following factors may make you more likely to develop this condition: Smoking. Having type 2 diabetes mellitus, high cholesterol, or both. Not getting enough exercise or physical activity. Being overweight. Having too much fat, sugar, calories, or salt (sodium) in your diet. Drinking too much alcohol. Some risk factors for high blood pressure may be difficult or impossible to change. Some of these factors include: Having chronic kidney disease. Having a family history of high blood pressure. Age. Risk increases with age. Race. You may  be at higher risk if you are African American. Gender. Men are at higher risk than women before age 53. After age 24, women are at higher risk than men. Having obstructive sleep apnea. Stress. What are the signs or symptoms? High blood pressure may not cause symptoms. Very high blood pressure (hypertensive crisis) may cause: Headache. Anxiety. Shortness of breath. Nosebleed. Nausea and vomiting. Vision changes. Severe chest pain. Seizures. How is this diagnosed? This condition is diagnosed by measuring your blood pressure while you are seated, with your arm  resting on a flat surface, your legs uncrossed, and your feet flat on the floor. The cuff of the blood pressure monitor will be placed directly against the skin of your upper arm at the level of your heart. It should be measured at least twice using the same arm. Certain conditions can cause a difference in blood pressure between your right and left arms. Certain factors can cause blood pressure readings to be lower or higher than normal for a short period of time: When your blood pressure is higher when you are in a health care provider's office than when you are at home, this is called white coat hypertension. Most people with this condition do not need medicines. When your blood pressure is higher at home than when you are in a health care provider's office, this is called masked hypertension. Most people with this condition may need medicines to control blood pressure. If you have a high blood pressure reading during one visit or you have normal blood pressure with other risk factors, you may be asked to: Return on a different day to have your blood pressure checked again. Monitor your blood pressure at home for 1 week or longer. If you are diagnosed with hypertension, you may have other blood or imaging tests to help your health care provider understand your overall risk for other conditions. How is this treated? This condition is treated by making healthy lifestyle changes, such as eating healthy foods, exercising more, and reducing your alcohol intake. Your health care provider may prescribe medicine if lifestyle changes are not enough to get your blood pressure under control, and if: Your systolic blood pressure is above 130. Your diastolic blood pressure is above 80. Your personal target blood pressure may vary depending on your medical conditions, your age, and other factors. Follow these instructions at home: Eating and drinking  Eat a diet that is high in fiber and potassium, and low in  sodium, added sugar, and fat. An example eating plan is called the DASH (Dietary Approaches to Stop Hypertension) diet. To eat this way: Eat plenty of fresh fruits and vegetables. Try to fill one half of your plate at each meal with fruits and vegetables. Eat whole grains, such as whole-wheat pasta, brown rice, or whole-grain bread. Fill about one fourth of your plate with whole grains. Eat or drink low-fat dairy products, such as skim milk or low-fat yogurt. Avoid fatty cuts of meat, processed or cured meats, and poultry with skin. Fill about one fourth of your plate with lean proteins, such as fish, chicken without skin, beans, eggs, or tofu. Avoid pre-made and processed foods. These tend to be higher in sodium, added sugar, and fat. Reduce your daily sodium intake. Most people with hypertension should eat less than 1,500 mg of sodium a day. Do not drink alcohol if: Your health care provider tells you not to drink. You are pregnant, may be pregnant, or are planning to become pregnant. If  you drink alcohol: Limit how much you use to: 0-1 drink a day for women. 0-2 drinks a day for men. Be aware of how much alcohol is in your drink. In the [place] one drink equals one 12 oz bottle of beer (355 mL), one 5 oz glass of wine (148 mL), or one 1 oz glass of hard liquor (44 mL). Lifestyle  Work with your health care provider to maintain a healthy body weight or to lose weight. Ask what an ideal weight is for you. Get at least 30 minutes of exercise most days of the week. Activities may include walking, swimming, or biking. Include exercise to strengthen your muscles (resistance exercise), such as Pilates or lifting weights, as part of your weekly exercise routine. Try to do these types of exercises for 30 minutes at least 3 days a week. Do not use any products that contain nicotine or tobacco, such as cigarettes, e-cigarettes, and chewing tobacco. If you need help quitting, ask your health care  provider. Monitor your blood pressure at home as told by your health care provider. Keep all follow-up visits as told by your health care provider. This is important. Medicines Take over-the-counter and prescription medicines only as told by your health care provider. Follow directions carefully. Blood pressure medicines must be taken as prescribed. Do not skip doses of blood pressure medicine. Doing this puts you at risk for problems and can make the medicine less effective. Ask your health care provider about side effects or reactions to medicines that you should watch for. Contact a health care provider if you: Think you are having a reaction to a medicine you are taking. Have headaches that keep coming back (recurring). Feel dizzy. Have swelling in your ankles. Have trouble with your vision. Get help right away if you: Develop a severe headache or confusion. Have unusual weakness or numbness. Feel faint. Have severe pain in your chest or abdomen. Vomit repeatedly. Have trouble breathing. Summary Hypertension is when the force of blood pumping through your arteries is too strong. If this condition is not controlled, it may put you at risk for serious complications. Your personal target blood pressure may vary depending on your medical conditions, your age, and other factors. For most people, a normal blood pressure is less than 120/80. Hypertension is treated with lifestyle changes, medicines, or a combination of both. Lifestyle changes include losing weight, eating a healthy, low-sodium diet, exercising more, and limiting alcohol. This information is not intended to replace advice given to you by your health care provider. Make sure you discuss any questions you have with your health care provider. Document Revised: 06/15/2018 Document Reviewed: 06/15/2018 Elsevier Patient Education  [place].

## 2021-10-21 NOTE — Assessment & Plan Note (Signed)
A1c completed, patient is 7.1 and is not currently on any medication, I started patient on metformin 500 mg tablet by mouth twice daily. Continue walking, increase hydration, follow up in 3 months, education provided to patient with printed hand out given.  RX sent to pharmacy.

## 2021-10-21 NOTE — Progress Notes (Signed)
New Patient Note  RE: Andrea Shannon MRN: [id number] DOB: 12-19-1945 Date of Office Visit: 10/21/2021  Chief Complaint: New Patient (Initial Visit) and Diabetes  History of Present Illness:  Patient is a 76 year old patient establishing care with a medical history of diabetes  Mellitus type 2, Hypertension , and hypothyroidism  Diabetes Mellitus Type II, Initial Visit: Patient here for an initial evaluation of Type 2 diabetes mellitus.  Current symptoms/problems include hyperglycemia and have been unchanged. Symptoms have been present for a few years.  The patient was initially diagnosed with Type 2 diabetes mellitus based on the following criteria:  A1C 7.1.  Known diabetic complications:  Neuropathy Cardiovascular risk factors: advanced age (older than 14 for men, 62 for women) Current diabetic medications include none.   Eye exam current (within one year): no Weight trend: stable Prior visit with dietician: no Current diet: well balanced, high fat/ cholesterol Current exercise: walking  Current monitoring regimen: none Home blood sugar records:  patient doesn't check blood sugars at home Any episodes of hypoglycemia? no  Is She on ACE inhibitor or angiotensin II receptor blocker?  Yes  lisinopril (Prinivil), lisinopril (Zestril), and hydrochlothiazide    Assessment and Plan: Andrea Shannon is a 76 y.o. female with: Controlled type 2 diabetes mellitus without complication, without long-term current use of insulin (HCC) A1c completed, patient is 7.1 and is not currently on any medication, I started patient on metformin 500 mg tablet by mouth twice daily. Continue walking, increase hydration, follow up in 3 months, education provided to patient with printed hand out given.  RX sent to pharmacy.  Hypertension associated with diabetes ([place]) Blood pressure well controled with current medication dose, no changes necessary.  Establishing care with new doctor, encounter for Patient  is establishing care today, education provided to patient on the importance of health maintenance and preventative care.  Patient is scheduled for follow-up 3 months chronic disease management, patient will schedule yearly physical.  Patient declines hep C, Tdap, Prevnar, flu and Shingrix today.  Patient will bring /fax in other  hospital records for documentation  Return in about 3 months (around 01/19/2022), or if symptoms worsen or fail to improve.   Diagnostics:   Past Medical History: Patient Active Problem List   Diagnosis Date Noted   Controlled type 2 diabetes mellitus without complication, without long-term current use of insulin ([place]) 10/21/2021   Hypertension associated with diabetes ([place]) 10/21/2021   Establishing care with new doctor, encounter for 10/21/2021   OAB (overactive bladder) 04/15/2020   Urinary urgency 02/26/2020   Dysuria 02/26/2020   Abnormal thyroid biopsy 12/06/2018   Toxic multinodul goiter 09/26/2018   Past Medical History:  Diagnosis Date   Acid reflux    Arthritis    Diabetes mellitus, type II ([place])    Hypertension    Hyperthyroidism    Hypothyroidism    Past Surgical History: Past Surgical History:  Procedure Laterality Date   ABDOMINAL HYSTERECTOMY     Medication List:  Current Outpatient Medications  Medication Sig Dispense Refill   aspirin 81 MG chewable tablet Chew by mouth daily.     diclofenac (VOLTAREN) 75 MG EC tablet Take 75 mg by mouth daily.     latanoprost (XALATAN) 0.005 % ophthalmic solution Apply to eye.     levothyroxine (SYNTHROID) 75 MCG tablet TAKE ONE TABLET BY MOUTH EVERY DAY BEFORE BREAKFAST 30 tablet 0   lisinopril-hydrochlorothiazide (PRINZIDE,ZESTORETIC) 20-12.5 MG tablet Take 1 tablet by mouth daily.  metFORMIN (GLUCOPHAGE) 500 MG tablet Take 1 tablet (500 mg total) by mouth 2 (two) times daily with a meal. 60 tablet 0   mirabegron ER (MYRBETRIQ) 50 MG TB24 tablet Take 1 tablet (50 mg total) by mouth daily. 30  tablet 11   omeprazole (PRILOSEC) 40 MG capsule Take 40 mg by mouth daily.     sucralfate (CARAFATE) 1 g tablet Take 1 g by mouth 4 (four) times daily -  with meals and at bedtime.     No current facility-administered medications for this visit.   Allergies: No Known Allergies Social History: Social History   Socioeconomic History   Marital status: Widowed    Spouse name: Not on file   Number of children: 1   Years of education: 12   Highest education level: High school graduate  Occupational History   Not on file  Tobacco Use   Smoking status: Never   Smokeless tobacco: Never  Vaping Use   Vaping Use: Never used  Substance and Sexual Activity   Alcohol use: Not Currently   Drug use: Never   Sexual activity: Not Currently    Birth control/protection: Surgical  Other Topics Concern   Not on file  Social History Narrative   Not on file   Social Determinants of Health   Financial Resource Strain: Not on file  Food Insecurity: Not on file  Transportation Needs: Not on file  Physical Activity: Not on file  Stress: Not on file  Social Connections: Not on file       Family History: Family History  Problem Relation Age of Onset   Diabetes Mother    Asthma Father    Heart disease Sister        CHF   Diabetes Sister    Asthma Sister    Arthritis Sister    Diabetes Son          Review of Systems  Constitutional: Negative.   HENT: Negative.    Eyes: Negative.   Respiratory: Negative.    Cardiovascular: Negative.   Genitourinary: Negative.   Musculoskeletal: Negative.   Skin: Negative.  Negative for rash.  All other systems reviewed and are negative. Objective: BP 120/64    Pulse 79    Temp (!) 97.1 F (36.2 C) (Temporal)    Ht 5\' 3"  (1.6 m)    Wt 158 lb 2 oz (71.7 kg)    BMI 28.01 kg/m  Body mass index is 28.01 kg/m. Physical Exam Vitals and nursing note reviewed.  Constitutional:      Appearance: Normal appearance.  HENT:     Head: Normocephalic.      Right Ear: External ear normal.     Left Ear: External ear normal.     Mouth/Throat:     Mouth: Mucous membranes are moist.     Pharynx: Oropharynx is clear.  Eyes:     Conjunctiva/sclera: Conjunctivae normal.  Cardiovascular:     Rate and Rhythm: Normal rate.     Pulses: Normal pulses.  Pulmonary:     Effort: Pulmonary effort is normal.     Breath sounds: Normal breath sounds.  Abdominal:     General: Bowel sounds are normal.  Skin:    General: Skin is warm.     Findings: No rash.  Neurological:     Mental Status: She is alert and oriented to person, place, and time.  Psychiatric:        Behavior: Behavior normal.   The plan was reviewed  with the patient/family, and all questions/concerned were addressed.  It was my pleasure to see Andrea Shannon today and participate in her care. Please feel free to contact me with any questions or concerns.  Sincerely,  Jac Canavan NP [place]

## 2021-10-21 NOTE — Assessment & Plan Note (Signed)
Patient is establishing care today, education provided to patient on the importance of health maintenance and preventative care.  Patient is scheduled for follow-up 3 months chronic disease management, patient will schedule yearly physical.  Patient declines hep C, Tdap, Prevnar, flu and Shingrix today.  Patient will bring /fax in other  hospital records for documentation

## 2021-10-21 NOTE — Assessment & Plan Note (Signed)
Blood pressure well controled with current medication dose, no changes necessary.

## 2021-10-22 ENCOUNTER — Other Ambulatory Visit: Payer: Self-pay | Admitting: Nurse Practitioner

## 2021-10-22 LAB — CMP14+EGFR
ALT: 16 IU/L (ref 0–32)
AST: 17 IU/L (ref 0–40)
Albumin/Globulin Ratio: 1.8 (ref 1.2–2.2)
Albumin: 4.2 g/dL (ref 3.7–4.7)
Alkaline Phosphatase: 76 IU/L (ref 44–121)
BUN/Creatinine Ratio: 20 (ref 12–28)
BUN: 20 mg/dL (ref 8–27)
Bilirubin Total: <0.2 mg/dL (ref 0.0–1.2)
CO2: 26 mmol/L (ref 20–29)
Calcium: 9.8 mg/dL (ref 8.7–10.3)
Chloride: 103 mmol/L (ref 96–106)
Creatinine, Ser: 1 mg/dL (ref 0.57–1.00)
Globulin, Total: 2.4 g/dL (ref 1.5–4.5)
Glucose: 97 mg/dL (ref 70–99)
Potassium: 4.4 mmol/L (ref 3.5–5.2)
Sodium: 143 mmol/L (ref 134–144)
Total Protein: 6.6 g/dL (ref 6.0–8.5)
eGFR: 59 mL/min/{1.73_m2} — ABNORMAL LOW (ref >59)

## 2021-10-22 LAB — CBC WITH DIFFERENTIAL/PLATELET
Basophils Absolute: 0 10*3/uL (ref 0.0–0.2)
Basos: 1 %
EOS (ABSOLUTE): 0.2 10*3/uL (ref 0.0–0.4)
Eos: 3 %
Hematocrit: 33.8 % — ABNORMAL LOW (ref 34.0–46.6)
Hemoglobin: 10.9 g/dL — ABNORMAL LOW (ref 11.1–15.9)
Immature Grans (Abs): 0 10*3/uL (ref 0.0–0.1)
Immature Granulocytes: 0 %
Lymphocytes Absolute: 1.7 10*3/uL (ref 0.7–3.1)
Lymphs: 29 %
MCH: 23.8 pg — ABNORMAL LOW (ref 26.6–33.0)
MCHC: 32.2 g/dL (ref 31.5–35.7)
MCV: 74 fL — ABNORMAL LOW (ref 79–97)
Monocytes Absolute: 0.4 10*3/uL (ref 0.1–0.9)
Monocytes: 6 %
Neutrophils Absolute: 3.6 10*3/uL (ref 1.4–7.0)
Neutrophils: 61 %
Platelets: 385 10*3/uL (ref 150–450)
RBC: 4.58 x10E6/uL (ref 3.77–5.28)
RDW: 14.9 % (ref 11.7–15.4)
WBC: 5.9 10*3/uL (ref 3.4–10.8)

## 2021-10-22 LAB — LIPID PANEL
Chol/HDL Ratio: 3.3 ratio (ref 0.0–4.4)
Cholesterol, Total: 151 mg/dL (ref 100–199)
HDL: 46 mg/dL (ref >39)
LDL Chol Calc (NIH): 81 mg/dL (ref 0–99)
Triglycerides: 136 mg/dL (ref 0–149)
VLDL Cholesterol Cal: 24 mg/dL (ref 5–40)

## 2021-10-22 MED ORDER — IRON (FERROUS SULFATE) 325 (65 FE) MG PO TABS: 325.0000 mg | ORAL_TABLET | Freq: Every day | ORAL | 3 refills | Status: DC

## 2021-10-30 ENCOUNTER — Other Ambulatory Visit: Payer: Self-pay

## 2021-10-30 ENCOUNTER — Telehealth: Payer: Self-pay | Admitting: Nurse Practitioner

## 2021-10-30 DIAGNOSIS — E890 Postprocedural hypothyroidism: Secondary | ICD-10-CM

## 2021-10-30 MED ORDER — LEVOTHYROXINE SODIUM 75 MCG PO TABS: ORAL_TABLET | ORAL | 2 refills | Status: DC

## 2021-10-30 NOTE — Telephone Encounter (Signed)
Patient states she needs a new RX for her levothyroxine sent to [facility]. She gets it free.

## 2021-10-30 NOTE — Telephone Encounter (Signed)
Medication has been sent to the pharmacy requested. °

## 2021-11-05 ENCOUNTER — Other Ambulatory Visit: Payer: Self-pay | Admitting: Nurse Practitioner

## 2021-11-05 DIAGNOSIS — E119 Type 2 diabetes mellitus without complications: Secondary | ICD-10-CM

## 2021-11-05 MED ORDER — METFORMIN HCL 500 MG PO TABS: 500.0000 mg | ORAL_TABLET | Freq: Two times a day (BID) | ORAL | 0 refills | Status: DC

## 2021-11-05 NOTE — Telephone Encounter (Signed)
°  Prescription Request  11/05/2021  Is this a "Controlled Substance" medicine? no  Have you seen your PCP in the last 2 weeks? 10/21/2021 --new pt apt   If YES, route message to pool  -  If NO, patient needs to be scheduled for appointment.  What is the name of the medication or equipment?  90 day supply metFORMIN (GLUCOPHAGE) 500 MG tablet diclofenac (VOLTAREN) 75 MG EC tablet  Have you contacted your pharmacy to request a refill? no   Which pharmacy would you like this sent to? [facility] by mail [phone]   Patient notified that their request is being sent to the clinical staff for review and that they should receive a response within 2 business days.

## 2021-11-07 MED ORDER — DICLOFENAC SODIUM 75 MG PO TBEC: 75.0000 mg | DELAYED_RELEASE_TABLET | Freq: Every day | ORAL | 2 refills | Status: DC

## 2021-11-07 NOTE — Telephone Encounter (Signed)
Pt aware refills sent to pharmacy 

## 2021-12-31 ENCOUNTER — Ambulatory Visit (INDEPENDENT_AMBULATORY_CARE_PROVIDER_SITE_OTHER): Payer: Medicare Other

## 2021-12-31 VITALS — Ht 63.0 in | Wt 158.0 lb

## 2021-12-31 DIAGNOSIS — Z1231 Encounter for screening mammogram for malignant neoplasm of breast: Secondary | ICD-10-CM | POA: Diagnosis not present

## 2021-12-31 DIAGNOSIS — Z0000 Encounter for general adult medical examination without abnormal findings: Secondary | ICD-10-CM | POA: Diagnosis not present

## 2021-12-31 DIAGNOSIS — Z780 Asymptomatic menopausal state: Secondary | ICD-10-CM

## 2021-12-31 NOTE — Patient Instructions (Signed)
Andrea Shannon , ?Thank you for taking time to come for your Medicare Wellness Visit. I appreciate your ongoing commitment to your health goals. Please review the following plan we discussed and let me know if I can assist you in the future.  ? ?Screening recommendations/referrals: ?Colonoscopy: Done in 2019. Repeat in 10 years ? ?Mammogram: Done 02/16/2021 Repeat annually ? ?Bone Density: Due. Order placed today. ? ?Recommended yearly ophthalmology/optometry visit for glaucoma screening and checkup ?Recommended yearly dental visit for hygiene and checkup ? ?Vaccinations: ?Influenza vaccine: Declined. ?Pneumococcal vaccine: Declined. ?Tdap vaccine: Due Repeat in 10 years ? ?Shingles vaccine: Declined.   ?Covid-19:Declined. ? ?Advanced directives: Advance directive discussed with you today. Even though you declined this today, please call our office should you change your mind, and we can give you the proper paperwork for you to fill out. ? ? ?Conditions/risks identified: KEEP UP THE GOOD WORK!! ? ?Next appointment: Follow up in one year for your annual wellness visit 2024. ? ? ?Preventive Care 74 Years and Older, Female ?Preventive care refers to lifestyle choices and visits with your health care provider that can promote health and wellness. ?What does preventive care include? ?A yearly physical exam. This is also called an annual well check. ?Dental exams once or twice a year. ?Routine eye exams. Ask your health care provider how often you should have your eyes checked. ?Personal lifestyle choices, including: ?Daily care of your teeth and gums. ?Regular physical activity. ?Eating a healthy diet. ?Avoiding tobacco and drug use. ?Limiting alcohol use. ?Practicing safe sex. ?Taking low-dose aspirin every day. ?Taking vitamin and mineral supplements as recommended by your health care provider. ?What happens during an annual well check? ?The services and screenings done by your health care provider during your annual well  check will depend on your age, overall health, lifestyle risk factors, and family history of disease. ?Counseling  ?Your health care provider may ask you questions about your: ?Alcohol use. ?Tobacco use. ?Drug use. ?Emotional well-being. ?Home and relationship well-being. ?Sexual activity. ?Eating habits. ?History of falls. ?Memory and ability to understand (cognition). ?Work and work Statistician. ?Reproductive health. ?Screening  ?You may have the following tests or measurements: ?Height, weight, and BMI. ?Blood pressure. ?Lipid and cholesterol levels. These may be checked every 5 years, or more frequently if you are over 23 years old. ?Skin check. ?Lung cancer screening. You may have this screening every year starting at age 74 if you have a 30-pack-year history of smoking and currently smoke or have quit within the past 15 years. ?Fecal occult blood test (FOBT) of the stool. You may have this test every year starting at age 74. ?Flexible sigmoidoscopy or colonoscopy. You may have a sigmoidoscopy every 5 years or a colonoscopy every 10 years starting at age 45. ?Hepatitis C blood test. ?Hepatitis B blood test. ?Sexually transmitted disease (STD) testing. ?Diabetes screening. This is done by checking your blood sugar (glucose) after you have not eaten for a while (fasting). You may have this done every 1-3 years. ?Bone density scan. This is done to screen for osteoporosis. You may have this done starting at age 74. ?Mammogram. This may be done every 1-2 years. Talk to your health care provider about how often you should have regular mammograms. ?Talk with your health care provider about your test results, treatment options, and if necessary, the need for more tests. ?Vaccines  ?Your health care provider may recommend certain vaccines, such as: ?Influenza vaccine. This is recommended every year. ?Tetanus, diphtheria, and  acellular pertussis (Tdap, Td) vaccine. You may need a Td booster every 10 years. ?Zoster  vaccine. You may need this after age 74. ?Pneumococcal 13-valent conjugate (PCV13) vaccine. One dose is recommended after age 74. ?Pneumococcal polysaccharide (PPSV23) vaccine. One dose is recommended after age 74. ?Talk to your health care provider about which screenings and vaccines you need and how often you need them. ?This information is not intended to replace advice given to you by your health care provider. Make sure you discuss any questions you have with your health care provider. ?Document Released: 11/01/2015 Document Revised: 06/24/2016 Document Reviewed: 08/06/2015 ?Elsevier Interactive Patient Education ? 2017 [place]. ? ?Fall Prevention in the Home ?Falls can cause injuries. They can happen to people of all ages. There are many things you can do to make your home safe and to help prevent falls. ?What can I do on the outside of my home? ?Regularly fix the edges of walkways and driveways and fix any cracks. ?Remove anything that might make you trip as you walk through a door, such as a raised step or threshold. ?Trim any bushes or trees on the path to your home. ?Use bright outdoor lighting. ?Clear any walking paths of anything that might make someone trip, such as rocks or tools. ?Regularly check to see if handrails are loose or broken. Make sure that both sides of any steps have handrails. ?Any raised decks and porches should have guardrails on the edges. ?Have any leaves, snow, or ice cleared regularly. ?Use sand or salt on walking paths during winter. ?Clean up any spills in your garage right away. This includes oil or grease spills. ?What can I do in the bathroom? ?Use night lights. ?Install grab bars by the toilet and in the tub and shower. Do not use towel bars as grab bars. ?Use non-skid mats or decals in the tub or shower. ?If you need to sit down in the shower, use a plastic, non-slip stool. ?Keep the floor dry. Clean up any water that spills on the floor as soon as it happens. ?Remove  soap buildup in the tub or shower regularly. ?Attach bath mats securely with double-sided non-slip rug tape. ?Do not have throw rugs and other things on the floor that can make you trip. ?What can I do in the bedroom? ?Use night lights. ?Make sure that you have a light by your bed that is easy to reach. ?Do not use any sheets or blankets that are too big for your bed. They should not hang down onto the floor. ?Have a firm chair that has side arms. You can use this for support while you get dressed. ?Do not have throw rugs and other things on the floor that can make you trip. ?What can I do in the kitchen? ?Clean up any spills right away. ?Avoid walking on wet floors. ?Keep items that you use a lot in easy-to-reach places. ?If you need to reach something above you, use a strong step stool that has a grab bar. ?Keep electrical cords out of the way. ?Do not use floor polish or wax that makes floors slippery. If you must use wax, use non-skid floor wax. ?Do not have throw rugs and other things on the floor that can make you trip. ?What can I do with my stairs? ?Do not leave any items on the stairs. ?Make sure that there are handrails on both sides of the stairs and use them. Fix handrails that are broken or loose. Make sure that  handrails are as long as the stairways. ?Check any carpeting to make sure that it is firmly attached to the stairs. Fix any carpet that is loose or worn. ?Avoid having throw rugs at the top or bottom of the stairs. If you do have throw rugs, attach them to the floor with carpet tape. ?Make sure that you have a light switch at the top of the stairs and the bottom of the stairs. If you do not have them, ask someone to add them for you. ?What else can I do to help prevent falls? ?Wear shoes that: ?Do not have high heels. ?Have rubber bottoms. ?Are comfortable and fit you well. ?Are closed at the toe. Do not wear sandals. ?If you use a stepladder: ?Make sure that it is fully opened. Do not climb a  closed stepladder. ?Make sure that both sides of the stepladder are locked into place. ?Ask someone to hold it for you, if possible. ?Clearly mark and make sure that you can see: ?Any grab bars or handrails. ?

## 2021-12-31 NOTE — Progress Notes (Signed)
? ?Subjective:  ? Andrea Shannon is a 76 y.o. female who presents for an Initial Medicare Annual Wellness Visit. ?Virtual Visit via Telephone Note ? ?I connected with  Andrea Shannon on 12/31/21 at 11:15 AM EDT by telephone and verified that I am speaking with the correct person using two identifiers. ? ?Location: ?Patient: HOME ?Provider: [facility] ?Persons participating in the virtual visit: patient/Nurse Health Advisor ?  ?I discussed the limitations, risks, security and privacy concerns of performing an evaluation and management service by telephone and the availability of in person appointments. The patient expressed understanding and agreed to proceed. ? ?Interactive audio and video telecommunications were attempted between this nurse and patient, however failed, due to patient having technical difficulties OR patient did not have access to video capability.  We continued and completed visit with audio only. ? ?Some vital signs may be absent or patient reported.  ? ?Andrea Driver, LPN ? ?Review of Systems    ? ?Cardiac Risk Factors include: advanced age (>63mn, >>5women);diabetes mellitus;hypertension;sedentary lifestyle ? ?   ?Objective:  ?  ?Today's Vitals  ? 12/31/21 1116 12/31/21 1119  ?Weight: 158 lb (71.7 kg)   ?Height: '5\' 3"'$  (1.6 m)   ?PainSc:  3   ? ?Body mass index is 27.99 kg/m?. ? ?Advanced Directives 12/31/2021  ?Does Patient Have a Medical Advance Directive? No  ?Would patient like information on creating a medical advance directive? No - Patient declined  ? ? ?Current Medications (verified) ?Outpatient Encounter Medications as of 12/31/2021  ?Medication Sig  ? aspirin 81 MG chewable tablet Chew by mouth daily.  ? diclofenac (VOLTAREN) 75 MG EC tablet Take 1 tablet (75 mg total) by mouth daily.  ? Iron, Ferrous Sulfate, 325 (65 Fe) MG TABS Take 325 mg by mouth daily.  ? latanoprost (XALATAN) 0.005 % ophthalmic solution Apply to eye.  ? levothyroxine (SYNTHROID) 75 MCG tablet TAKE ONE TABLET BY  MOUTH EVERY DAY BEFORE BREAKFAST  ? lisinopril-hydrochlorothiazide (PRINZIDE,ZESTORETIC) 20-12.5 MG tablet Take 1 tablet by mouth daily.  ? metFORMIN (GLUCOPHAGE) 500 MG tablet Take 1 tablet (500 mg total) by mouth 2 (two) times daily with a meal.  ? mirabegron ER (MYRBETRIQ) 50 MG TB24 tablet Take 1 tablet (50 mg total) by mouth daily.  ? omeprazole (PRILOSEC) 40 MG capsule Take 40 mg by mouth daily.  ? sucralfate (CARAFATE) 1 g tablet Take 1 g by mouth 4 (four) times daily -  with meals and at bedtime.  ? ?No facility-administered encounter medications on file as of 12/31/2021.  ? ? ?Allergies (verified) ?Patient has no known allergies.  ? ?History: ?Past Medical History:  ?Diagnosis Date  ? Acid reflux   ? Arthritis   ? Diabetes mellitus, type II ([place]   ? Hypertension   ? Hyperthyroidism   ? Hypothyroidism   ? ?Past Surgical History:  ?Procedure Laterality Date  ? ABDOMINAL HYSTERECTOMY    ? ?Family History  ?Problem Relation Age of Onset  ? Diabetes Mother   ? Asthma Father   ? Heart disease Sister   ?     CHF  ? Diabetes Sister   ? Asthma Sister   ? Arthritis Sister   ? Diabetes Son   ? ?Social History  ? ?Socioeconomic History  ? Marital status: Widowed  ?  Spouse name: Not on file  ? Number of children: 1  ? Years of education: 177 ? Highest education level: High school graduate  ?Occupational History  ? Not on  file  ?Tobacco Use  ? Smoking status: Never  ? Smokeless tobacco: Never  ?Vaping Use  ? Vaping Use: Never used  ?Substance and Sexual Activity  ? Alcohol use: Not Currently  ? Drug use: Never  ? Sexual activity: Not Currently  ?  Birth control/protection: Surgical  ?Other Topics Concern  ? Not on file  ?Social History Narrative  ? Not on file  ? ?Social Determinants of Health  ? ?Financial Resource Strain: Low Risk   ? Difficulty of Paying Living Expenses: Not hard at all  ?Food Insecurity: No Food Insecurity  ? Worried About Charity fundraiser in the Last Year: Never true  ? Ran Out of Food in the  Last Year: Never true  ?Transportation Needs: No Transportation Needs  ? Lack of Transportation (Medical): No  ? Lack of Transportation (Non-Medical): No  ?Physical Activity: Insufficiently Active  ? Days of Exercise per Week: 2 days  ? Minutes of Exercise per Session: 60 min  ?Stress: No Stress Concern Present  ? Feeling of Stress : Not at all  ?Social Connections: Moderately Integrated  ? Frequency of Communication with Friends and Family: More than three times a week  ? Frequency of Social Gatherings with Friends and Family: More than three times a week  ? Attends Religious Services: More than 4 times per year  ? Active Member of Clubs or Organizations: Yes  ? Attends Archivist Meetings: More than 4 times per year  ? Marital Status: Widowed  ? ? ?Tobacco Counseling ?Counseling given: Not Answered ? ? ?Clinical Intake: ? ?Pre-visit preparation completed: Yes ? ?Pain : 0-10 ?Pain Score: 3  ?Pain Type: Chronic pain ?Pain Location: Back ?Pain Descriptors / Indicators: Aching, Dull ?Pain Onset: More than a month ago ?Pain Frequency: Intermittent ? ?  ? ?BMI - recorded: 27.99 ?Nutritional Status: BMI 25 -29 Overweight ?Nutritional Risks: None ?Diabetes: Yes ? ?How often do you need to have someone help you when you read instructions, pamphlets, or other written materials from your doctor or pharmacy?: 1 - Never ? ?Diabetic?Nutrition Risk Assessment: ? ?Has the patient had any N/V/D within the last 2 months?  No  ?Does the patient have any non-healing wounds?  No  ?Has the patient had any unintentional weight loss or weight gain?  No  ? ?Diabetes: ? ?Is the patient diabetic?  Yes  ?If diabetic, was a CBG obtained today?  No  Phone visit ?Did the patient bring in their glucometer from home?   Phone visit ?How often do you monitor your CBG's? Pt states she does not check.  ? ?Financial Strains and Diabetes Management: ? ?Are you having any financial strains with the device, your supplies or your medication? No  .  ?Does the patient want to be seen by Chronic Care Management for management of their diabetes?  No  ?Would the patient like to be referred to a Nutritionist or for Diabetic Management?  No  ? ?Diabetic Exams: ? ?Diabetic Eye Exam: Completed 2022. Pt has been advised about the importance in completing this exam.  ?Diabetic Foot Exam: Completed DUE. Pt has been advised about the importance in completing this exam.  ? ?Interpreter Needed?: No ? ?Information entered by :: mj [place] Gipe, lpn ? ? ?Activities of Daily Living ?In your present state of health, do you have any difficulty performing the following activities: 12/31/2021  ?Hearing? N  ?Vision? N  ?Difficulty concentrating or making decisions? Y  ?Comment Memory at times.  ?Walking  or climbing stairs? N  ?Dressing or bathing? N  ?Doing errands, shopping? N  ?Preparing Food and eating ? N  ?Using the Toilet? N  ?In the past six months, have you accidently leaked urine? Y  ?Comment On medication.  ?Do you have problems with loss of bowel control? N  ?Managing your Medications? N  ?Managing your Finances? N  ?Housekeeping or managing your Housekeeping? N  ?Some recent data might be hidden  ? ? ?Patient Care Team: ?Ivy Lynn, NP as PCP - General (Nurse Practitioner) ? ?Indicate any recent Medical Services you may have received from other than [facility] providers in the past year (date may be approximate). ? ?   ?Assessment:  ? This is a routine wellness examination for [place]. ? ?Hearing/Vision screen ?Hearing Screening - Comments:: Some hearing issues.  ?Vision Screening - Comments:: Readers. Sees Dr. Edwyna Ready in [place]. 2022. ? ?Dietary issues and exercise activities discussed: ?Current Exercise Habits: Home exercise routine, Type of exercise: walking;treadmill, Time (Minutes): 60, Frequency (Times/Week): 2, Weekly Exercise (Minutes/Week): 120, Intensity: Mild, Exercise limited by: cardiac condition(s) ? ? Goals Addressed   ? ?  ?  ?  ?  ? This Visit's Progress  ?   Exercise 3x per week (30 min per time)     ?  Continue to exercise and watch carbs.  ?  ? ?  ? ?Depression Screen ?PHQ 2/9 Scores 12/31/2021 10/21/2021 09/26/2018  ?PHQ - 2 Score 0 0 0  ?PHQ- 9 Score - 0 -

## 2022-01-19 ENCOUNTER — Ambulatory Visit (INDEPENDENT_AMBULATORY_CARE_PROVIDER_SITE_OTHER): Payer: Medicare Other | Admitting: Nurse Practitioner

## 2022-01-19 ENCOUNTER — Other Ambulatory Visit: Payer: Self-pay | Admitting: Nurse Practitioner

## 2022-01-19 ENCOUNTER — Encounter: Payer: Self-pay | Admitting: Nurse Practitioner

## 2022-01-19 VITALS — BP 125/68 | HR 88 | Temp 98.1°F | Ht 63.0 in | Wt 154.0 lb

## 2022-01-19 DIAGNOSIS — I152 Hypertension secondary to endocrine disorders: Secondary | ICD-10-CM | POA: Diagnosis not present

## 2022-01-19 DIAGNOSIS — E119 Type 2 diabetes mellitus without complications: Secondary | ICD-10-CM | POA: Diagnosis not present

## 2022-01-19 DIAGNOSIS — E1159 Type 2 diabetes mellitus with other circulatory complications: Secondary | ICD-10-CM

## 2022-01-19 LAB — CBC WITH DIFFERENTIAL/PLATELET
Basophils Absolute: 0 10*3/uL (ref 0.0–0.2)
Basos: 1 %
EOS (ABSOLUTE): 0.1 10*3/uL (ref 0.0–0.4)
Eos: 3 %
Hematocrit: 35.9 % (ref 34.0–46.6)
Hemoglobin: 11.3 g/dL (ref 11.1–15.9)
Immature Grans (Abs): 0 10*3/uL (ref 0.0–0.1)
Immature Granulocytes: 0 %
Lymphocytes Absolute: 1.4 10*3/uL (ref 0.7–3.1)
Lymphs: 24 %
MCH: 23.9 pg — ABNORMAL LOW (ref 26.6–33.0)
MCHC: 31.5 g/dL (ref 31.5–35.7)
MCV: 76 fL — ABNORMAL LOW (ref 79–97)
Monocytes Absolute: 0.3 10*3/uL (ref 0.1–0.9)
Monocytes: 6 %
Neutrophils Absolute: 3.8 10*3/uL (ref 1.4–7.0)
Neutrophils: 66 %
Platelets: 448 10*3/uL (ref 150–450)
RBC: 4.73 x10E6/uL (ref 3.77–5.28)
RDW: 16.2 % — ABNORMAL HIGH (ref 11.7–15.4)
WBC: 5.6 10*3/uL (ref 3.4–10.8)

## 2022-01-19 LAB — LIPID PANEL
Chol/HDL Ratio: 3.3 ratio (ref 0.0–4.4)
Cholesterol, Total: 162 mg/dL (ref 100–199)
HDL: 49 mg/dL (ref >39)
LDL Chol Calc (NIH): 77 mg/dL (ref 0–99)
Triglycerides: 221 mg/dL — ABNORMAL HIGH (ref 0–149)
VLDL Cholesterol Cal: 36 mg/dL (ref 5–40)

## 2022-01-19 LAB — COMPREHENSIVE METABOLIC PANEL
ALT: 19 IU/L (ref 0–32)
AST: 18 IU/L (ref 0–40)
Albumin/Globulin Ratio: 2.1 (ref 1.2–2.2)
Albumin: 4.7 g/dL (ref 3.7–4.7)
Alkaline Phosphatase: 76 IU/L (ref 44–121)
BUN/Creatinine Ratio: 19 (ref 12–28)
BUN: 22 mg/dL (ref 8–27)
Bilirubin Total: <0.2 mg/dL (ref 0.0–1.2)
CO2: 24 mmol/L (ref 20–29)
Calcium: 10.1 mg/dL (ref 8.7–10.3)
Chloride: 99 mmol/L (ref 96–106)
Creatinine, Ser: 1.15 mg/dL — ABNORMAL HIGH (ref 0.57–1.00)
Globulin, Total: 2.2 g/dL (ref 1.5–4.5)
Glucose: 107 mg/dL — ABNORMAL HIGH (ref 70–99)
Potassium: 4.2 mmol/L (ref 3.5–5.2)
Sodium: 139 mmol/L (ref 134–144)
Total Protein: 6.9 g/dL (ref 6.0–8.5)
eGFR: 50 mL/min/{1.73_m2} — ABNORMAL LOW (ref >59)

## 2022-01-19 LAB — BAYER DCA HB A1C WAIVED: HB A1C (BAYER DCA - WAIVED): 6.1 % — ABNORMAL HIGH (ref 4.8–5.6)

## 2022-01-19 NOTE — Progress Notes (Signed)
? ?Established Patient Office Visit ? ?Subjective:  ?Patient ID: Andrea Shannon, female    DOB: December 07, 1945  Age: 76 y.o. MRN: [id number] ? ?CC:  ?Chief Complaint  ?Patient presents with  ? chronic diseaese management  ? ? ?HPI ?Andrea Shannon presents for follow up of hypertension. Patient was diagnosed in 10/21/2021. The patient is tolerating the medication well without side effects. Compliance with treatment has been good; including taking medication as directed , maintains a healthy diet and regular exercise regimen , and following up as directed.  ? ?The patient presents with history of type II diabetes mellitus without complications. Patient was diagnosed in 10/21/2021. Compliance with treatment has been good; the patient takes medication as directed , maintains a diabetic diet and an exercise regimen , follows up as directed , and is keeping a glucose diary. Sugars runs patient did not bring a blood pressure log in clinic today. Patient specifically denies associated symptoms, including blurred vision, fatigue, polydipsia, polyphagia and polyuria . Patient denies hypoglycemia. In regard to preventative care, the patient performs foot self-exams daily and last ophthalmology exam was: patient will schedule.  ? ?Past Medical History:  ?Diagnosis Date  ? Acid reflux   ? Arthritis   ? Diabetes mellitus, type II ([place])   ? Hypertension   ? Hyperthyroidism   ? Hypothyroidism   ? ? ?Past Surgical History:  ?Procedure Laterality Date  ? ABDOMINAL HYSTERECTOMY    ? ? ?Family History  ?Problem Relation Age of Onset  ? Diabetes Mother   ? Asthma Father   ? Heart disease Sister   ?     CHF  ? Diabetes Sister   ? Asthma Sister   ? Arthritis Sister   ? Diabetes Son   ? ? ?Social History  ? ?Socioeconomic History  ? Marital status: Widowed  ?  Spouse name: Not on file  ? Number of children: 1  ? Years of education: 89  ? Highest education level: High school graduate  ?Occupational History  ? Not on file  ?Tobacco Use  ? Smoking  status: Never  ? Smokeless tobacco: Never  ?Vaping Use  ? Vaping Use: Never used  ?Substance and Sexual Activity  ? Alcohol use: Not Currently  ? Drug use: Never  ? Sexual activity: Not Currently  ?  Birth control/protection: Surgical  ?Other Topics Concern  ? Not on file  ?Social History Narrative  ? Not on file  ? ?Social Determinants of Health  ? ?Financial Resource Strain: Low Risk   ? Difficulty of Paying Living Expenses: Not hard at all  ?Food Insecurity: No Food Insecurity  ? Worried About Charity fundraiser in the Last Year: Never true  ? Ran Out of Food in the Last Year: Never true  ?Transportation Needs: No Transportation Needs  ? Lack of Transportation (Medical): No  ? Lack of Transportation (Non-Medical): No  ?Physical Activity: Insufficiently Active  ? Days of Exercise per Week: 2 days  ? Minutes of Exercise per Session: 60 min  ?Stress: No Stress Concern Present  ? Feeling of Stress : Not at all  ?Social Connections: Moderately Integrated  ? Frequency of Communication with Friends and Family: More than three times a week  ? Frequency of Social Gatherings with Friends and Family: More than three times a week  ? Attends Religious Services: More than 4 times per year  ? Active Member of Clubs or Organizations: Yes  ? Attends Archivist Meetings: More than 4 times  per year  ? Marital Status: Widowed  ?Intimate Partner Violence: Not At Risk  ? Fear of Current or Ex-Partner: No  ? Emotionally Abused: No  ? Physically Abused: No  ? Sexually Abused: No  ? ? ?Outpatient Medications Prior to Visit  ?Medication Sig Dispense Refill  ? aspirin 81 MG chewable tablet Chew by mouth daily.    ? diclofenac (VOLTAREN) 75 MG EC tablet Take 1 tablet (75 mg total) by mouth daily. 30 tablet 2  ? Iron, Ferrous Sulfate, 325 (65 Fe) MG TABS Take 325 mg by mouth daily. 30 tablet 3  ? latanoprost (XALATAN) 0.005 % ophthalmic solution Apply to eye.    ? levothyroxine (SYNTHROID) 75 MCG tablet TAKE ONE TABLET BY MOUTH  EVERY DAY BEFORE BREAKFAST 90 tablet 2  ? lisinopril-hydrochlorothiazide (PRINZIDE,ZESTORETIC) 20-12.5 MG tablet Take 1 tablet by mouth daily.    ? metFORMIN (GLUCOPHAGE) 500 MG tablet Take 1 tablet (500 mg total) by mouth 2 (two) times daily with a meal. 180 tablet 0  ? mirabegron ER (MYRBETRIQ) 50 MG TB24 tablet Take 1 tablet (50 mg total) by mouth daily. 30 tablet 11  ? omeprazole (PRILOSEC) 40 MG capsule Take 40 mg by mouth daily.    ? sucralfate (CARAFATE) 1 g tablet Take 1 g by mouth 4 (four) times daily -  with meals and at bedtime. (Patient not taking: Reported on 01/19/2022)    ? ?No facility-administered medications prior to visit.  ? ? ?No Known Allergies ? ?ROS ?Review of Systems  ?HENT: Negative.    ?Eyes: Negative.   ?Respiratory: Negative.    ?Cardiovascular: Negative.   ?Gastrointestinal: Negative.   ?Genitourinary: Negative.   ?Musculoskeletal: Negative.   ?Skin: Negative.   ?Neurological: Negative.   ?All other systems reviewed and are negative. ? ?  ?Objective:  ?  ?Physical Exam ?Vitals and nursing note reviewed.  ?Constitutional:   ?   Appearance: Normal appearance.  ?HENT:  ?   Head: Normocephalic.  ?   Right Ear: Ear canal and external ear normal.  ?   Left Ear: Ear canal and external ear normal.  ?   Nose: Nose normal.  ?Eyes:  ?   Conjunctiva/sclera: Conjunctivae normal.  ?Cardiovascular:  ?   Rate and Rhythm: Normal rate and regular rhythm.  ?   Pulses: Normal pulses.  ?   Heart sounds: Normal heart sounds.  ?Pulmonary:  ?   Effort: Pulmonary effort is normal.  ?   Breath sounds: Normal breath sounds.  ?Abdominal:  ?   General: Bowel sounds are normal.  ?Skin: ?   General: Skin is warm.  ?   Findings: No rash.  ?Neurological:  ?   General: No focal deficit present.  ?   Mental Status: She is alert and oriented to person, place, and time.  ?Psychiatric:     ?   Mood and Affect: Mood normal.     ?   Behavior: Behavior normal.  ? ? ?BP 125/68   Pulse 88   Temp 98.1 ?F (36.7 ?C)   Ht _0   (1.6 m)   Wt 154 lb (69.9 kg)   SpO2 98%   BMI 27.28 kg/m?  ?Wt Readings from Last 3 Encounters:  ?01/19/22 154 lb (69.9 kg)  ?12/31/21 158 lb (71.7 kg)  ?10/21/21 158 lb 2 oz (71.7 kg)  ? ? ? ?Health Maintenance Due  ?Topic Date Due  ? FOOT EXAM  Never done  ? OPHTHALMOLOGY EXAM  Never done  ? ? ?  There are no preventive care reminders to display for this patient. ? ?Lab Results  ?Component Value Date  ? TSH 0.18 (A) 09/10/2021  ? ?Lab Results  ?Component Value Date  ? WBC 5.9 10/21/2021  ? HGB 10.9 (L) 10/21/2021  ? HCT 33.8 (L) 10/21/2021  ? MCV 74 (L) 10/21/2021  ? PLT 385 10/21/2021  ? ?Lab Results  ?Component Value Date  ? NA 143 10/21/2021  ? K 4.4 10/21/2021  ? CO2 26 10/21/2021  ? GLUCOSE 97 10/21/2021  ? BUN 20 10/21/2021  ? CREATININE 1.00 10/21/2021  ? BILITOT <0.2 10/21/2021  ? ALKPHOS 76 10/21/2021  ? AST 17 10/21/2021  ? ALT 16 10/21/2021  ? PROT 6.6 10/21/2021  ? ALBUMIN 4.2 10/21/2021  ? CALCIUM 9.8 10/21/2021  ? EGFR 59 (L) 10/21/2021  ? ?Lab Results  ?Component Value Date  ? CHOL 151 10/21/2021  ? ?Lab Results  ?Component Value Date  ? HDL 46 10/21/2021  ? ?Lab Results  ?Component Value Date  ? [place] 81 10/21/2021  ? ?Lab Results  ?Component Value Date  ? TRIG 136 10/21/2021  ? ?Lab Results  ?Component Value Date  ? CHOLHDL 3.3 10/21/2021  ? ?Lab Results  ?Component Value Date  ? HGBA1C 6.1 (H) 01/19/2022  ? ? ?  ?Assessment & Plan:  ? ?Problem List Items Addressed This Visit   ? ?  ? Cardiovascular and Mediastinum  ? Hypertension associated with diabetes ([place])  ?  Blood pressure well controlled on current medication no changes necessary.  Continue low-sodium diet exercise and weight loss.  Follow-up in 3 months.  Labs completed today-CBC, CMP, lipid panel. ?  ?  ? Relevant Orders  ? Comprehensive metabolic panel  ? Lipid Panel  ?  ? Endocrine  ? Controlled type 2 diabetes mellitus without complication, without long-term current use of insulin ([place]) - Primary  ? Relevant Orders  ? Bayer DCA Hb  A1c Waived (Completed)  ? CBC with Differential  ? Comprehensive metabolic panel  ? Lipid Panel  ? ? ?No orders of the defined types were placed in this encounter. ? ? ?Follow-up: Return in about 3 months (aroun

## 2022-01-19 NOTE — Assessment & Plan Note (Signed)
Blood pressure well controlled on current medication no changes necessary.  Continue low-sodium diet exercise and weight loss.  Follow-up in 3 months.  Labs completed today-CBC, CMP, lipid panel. ?

## 2022-01-19 NOTE — Patient Instructions (Signed)
Hypertension, Adult ?Hypertension is another name for high blood pressure. High blood pressure forces your heart to work harder to pump blood. This can cause problems over time. ?There are two numbers in a blood pressure reading. There is a top number (systolic) over a bottom number (diastolic). It is best to have a blood pressure that is below 120/80. Healthy choices can help lower your blood pressure, or you may need medicine to help lower it. ?What are the causes? ?The cause of this condition is not known. Some conditions may be related to high blood pressure. ?What increases the risk? ?Smoking. ?Having type 2 diabetes mellitus, high cholesterol, or both. ?Not getting enough exercise or physical activity. ?Being overweight. ?Having too much fat, sugar, calories, or salt (sodium) in your diet. ?Drinking too much alcohol. ?Having long-term (chronic) kidney disease. ?Having a family history of high blood pressure. ?Age. Risk increases with age. ?Race. You may be at higher risk if you are African American. ?Gender. Men are at higher risk than women before age 45. After age 65, women are at higher risk than men. ?Having obstructive sleep apnea. ?Stress. ?What are the signs or symptoms? ?High blood pressure may not cause symptoms. Very high blood pressure (hypertensive crisis) may cause: ?Headache. ?Feelings of worry or nervousness (anxiety). ?Shortness of breath. ?Nosebleed. ?A feeling of being sick to your stomach (nausea). ?Throwing up (vomiting). ?Changes in how you see. ?Very bad chest pain. ?Seizures. ?How is this treated? ?This condition is treated by making healthy lifestyle changes, such as: ?Eating healthy foods. ?Exercising more. ?Drinking less alcohol. ?Your health care provider may prescribe medicine if lifestyle changes are not enough to get your blood pressure under control, and if: ?Your top number is above 130. ?Your bottom number is above 80. ?Your personal target blood pressure may vary. ?Follow  these instructions at home: ?Eating and drinking ? ?If told, follow the DASH eating plan. To follow this plan: ?Fill one half of your plate at each meal with fruits and vegetables. ?Fill one fourth of your plate at each meal with whole grains. Whole grains include whole-wheat pasta, brown rice, and whole-grain bread. ?Eat or drink low-fat dairy products, such as skim milk or low-fat yogurt. ?Fill one fourth of your plate at each meal with low-fat (lean) proteins. Low-fat proteins include fish, chicken without skin, eggs, beans, and tofu. ?Avoid fatty meat, cured and processed meat, or chicken with skin. ?Avoid pre-made or processed food. ?Eat less than 1,500 mg of salt each day. ?Do not drink alcohol if: ?Your doctor tells you not to drink. ?You are pregnant, may be pregnant, or are planning to become pregnant. ?If you drink alcohol: ?Limit how much you use to: ?0-1 drink a day for women. ?0-2 drinks a day for men. ?Be aware of how much alcohol is in your drink. In the [place] one drink equals one 12 oz bottle of beer (355 mL), one 5 oz glass of wine (148 mL), or one 1? oz glass of hard liquor (44 mL). ?Lifestyle ? ?Work with your doctor to stay at a healthy weight or to lose weight. Ask your doctor what the best weight is for you. ?Get at least 30 minutes of exercise most days of the week. This may include walking, swimming, or biking. ?Get at least 30 minutes of exercise that strengthens your muscles (resistance exercise) at least 3 days a week. This may include lifting weights or doing Pilates. ?Do not use any products that contain nicotine or tobacco, such   as cigarettes, e-cigarettes, and chewing tobacco. If you need help quitting, ask your doctor. ?Check your blood pressure at home as told by your doctor. ?Keep all follow-up visits as told by your doctor. This is important. ?Medicines ?Take over-the-counter and prescription medicines only as told by your doctor. Follow directions carefully. ?Do not skip doses of  blood pressure medicine. The medicine does not work as well if you skip doses. Skipping doses also puts you at risk for problems. ?Ask your doctor about side effects or reactions to medicines that you should watch for. ?Contact a doctor if you: ?Think you are having a reaction to the medicine you are taking. ?Have headaches that keep coming back (recurring). ?Feel dizzy. ?Have swelling in your ankles. ?Have trouble with your vision. ?Get help right away if you: ?Get a very bad headache. ?Start to feel mixed up (confused). ?Feel weak or numb. ?Feel faint. ?Have very bad pain in your: ?Chest. ?Belly (abdomen). ?Throw up more than once. ?Have trouble breathing. ?Summary ?Hypertension is another name for high blood pressure. ?High blood pressure forces your heart to work harder to pump blood. ?For most people, a normal blood pressure is less than 120/80. ?Making healthy choices can help lower blood pressure. If your blood pressure does not get lower with healthy choices, you may need to take medicine. ?This information is not intended to replace advice given to you by your health care provider. Make sure you discuss any questions you have with your health care provider. ?Document Revised: 06/15/2018 Document Reviewed: 06/15/2018 ?Elsevier Patient Education ? 2022 [facility]. ?Diabetes Mellitus Basics ?Diabetes mellitus, or diabetes, is a long-term (chronic) disease. It occurs when the body does not properly use sugar (glucose) that is released from food after you eat. ?Diabetes mellitus may be caused by one or both of these problems: ?Your pancreas does not make enough of a hormone called insulin. ?Your body does not react in a normal way to the insulin that it makes. ?Insulin lets glucose enter cells in your body. This gives you energy. If you have diabetes, glucose cannot get into cells. This causes high blood glucose (hyperglycemia). ?How to treat and manage diabetes ?You may need to take insulin or other diabetes  medicines daily to keep your glucose in balance. If you are prescribed insulin, you will learn how to give yourself insulin by injection. You may need to adjust the amount of insulin you take based on the foods that you eat. ?You will need to check your blood glucose levels using a glucose monitor as told by your health care provider. The readings can help determine if you have low or high blood glucose. ?Generally, you should have these blood glucose levels: ?Before meals (preprandial): 80-130 mg/dL (4.4-7.2 mmol/L). ?After meals (postprandial): below 180 mg/dL (10 mmol/L). ?Hemoglobin A1c (HbA1c) level: less than 7%. ?Your health care provider will set treatment goals for you. ?Keep all follow-up visits. This is important. ?Follow these instructions at home: ?Diabetes medicines ?Take your diabetes medicines every day as told by your health care provider. List your diabetes medicines here: ?Name of medicine: ______________________________ ?Amount (dose): _______________ Time (a.m./p.m.): _______________ Notes: ___________________________________ ?Name of medicine: ______________________________ ?Amount (dose): _______________ Time (a.m./p.m.): _______________ Notes: ___________________________________ ?Name of medicine: ______________________________ ?Amount (dose): _______________ Time (a.m./p.m.): _______________ Notes: ___________________________________ ?Insulin ?If you use insulin, list the types of insulin you use here: ?Insulin type: ______________________________ ?Amount (dose): _______________ Time (a.m./p.m.): _______________Notes: ___________________________________ ?Insulin type: ______________________________ ?Amount (dose): _______________ Time (a.m./p.m.): _______________ Notes: ___________________________________ ?Insulin  type: ______________________________ ?Amount (dose): _______________ Time (a.m./p.m.): _______________ Notes: ___________________________________ ?Insulin type:  ______________________________ ?Amount (dose): _______________ Time (a.m./p.m.): _______________ Notes: ___________________________________ ?Insulin type: ______________________________ ?Amount (dose): _______________ Time

## 2022-01-20 ENCOUNTER — Telehealth: Payer: Self-pay | Admitting: Nurse Practitioner

## 2022-01-20 NOTE — Telephone Encounter (Signed)
Pt says that sucralfate has not been sent to [facility] in [place].pt was in yesterday. ?

## 2022-01-21 ENCOUNTER — Other Ambulatory Visit: Payer: Self-pay | Admitting: Nurse Practitioner

## 2022-01-21 MED ORDER — SUCRALFATE 1 G PO TABS: 1.0000 g | ORAL_TABLET | Freq: Three times a day (TID) | ORAL | 3 refills | Status: DC

## 2022-01-21 NOTE — Telephone Encounter (Signed)
01/20/22 Patient aware. AP  ?

## 2022-01-21 NOTE — Telephone Encounter (Signed)
Patient was seen on 4/3 and asked for sucralfate (CARAFATE) 1 g tablet to be calling in to [facility] [id number] - [place], [place]. The pharmacy has not received and in medication list it states that the patient stopped taking but she said that she only stopped taking because she ran out.  ?

## 2022-01-21 NOTE — Telephone Encounter (Signed)
Medication sent to pharmacy  

## 2022-02-02 ENCOUNTER — Other Ambulatory Visit: Payer: Self-pay | Admitting: Nurse Practitioner

## 2022-02-02 DIAGNOSIS — E119 Type 2 diabetes mellitus without complications: Secondary | ICD-10-CM

## 2022-02-02 MED ORDER — METFORMIN HCL 500 MG PO TABS: 500.0000 mg | ORAL_TABLET | Freq: Two times a day (BID) | ORAL | 3 refills | Status: DC

## 2022-02-02 MED ORDER — OMEPRAZOLE 40 MG PO CPDR: 40.0000 mg | DELAYED_RELEASE_CAPSULE | Freq: Every day | ORAL | 3 refills | Status: DC

## 2022-02-02 NOTE — Telephone Encounter (Signed)
?  Prescription Request ? ?02/02/2022 ? ?Is this a "Controlled Substance" medicine? NO ? ?Have you seen your PCP in the last 2 weeks? NO pt has 3 month f/u appt on 04/22/22 ? ?If YES, route message to pool  -  If NO, patient needs to be scheduled for appointment. ? ?What is the name of the medication or equipment? metFORMIN (GLUCOPHAGE) 500 MG  ?omeprazole (PRILOSEC) 40 MG capsule ? ?Have you contacted your pharmacy to request a refill? yes  ? ?Which pharmacy would you like this sent to? [facility]. Pt needs 90 day supply  ? ? ?Patient notified that their request is being sent to the clinical staff for review and that they should receive a response within 2 business days.  ? ? ?

## 2022-02-13 IMAGING — MR MR LUMBAR SPINE W/O CM
5 series · 31 of 48 positions shown · non-contrast
Comparison: Lumbar spine x-rays dated April 16, 2021.

CLINICAL DATA: Low back pain radiating into the right leg for the
past 3 and half months.

EXAM:
MRI LUMBAR SPINE WITHOUT CONTRAST
TECHNIQUE: Multiplanar, multisequence MR imaging of the lumbar spine was
performed. No intravenous contrast was administered.

[Series 5: T2 · sagittal · 4.0mm · 0.68mm/px · 6 of 15 slices shown (1 of 2)]
[im 1/15]
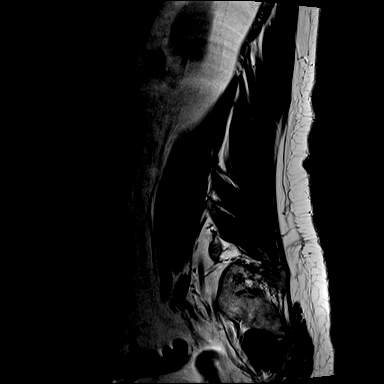
[im 3/15]
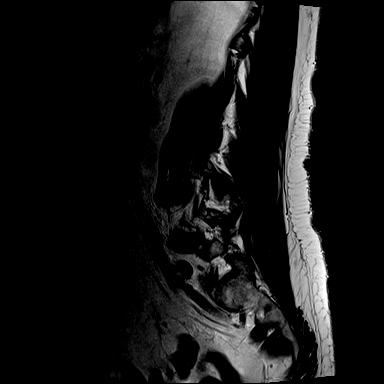
[im 6/15]
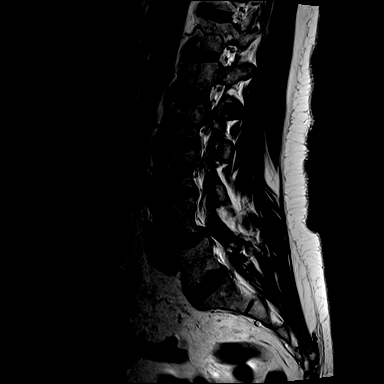
[im 9/15]
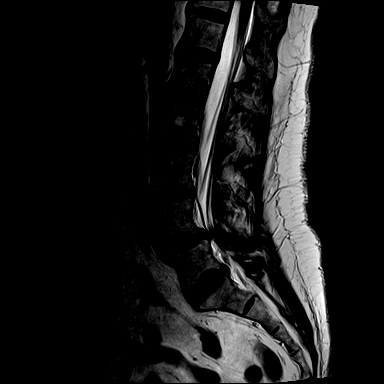
[im 12/15]
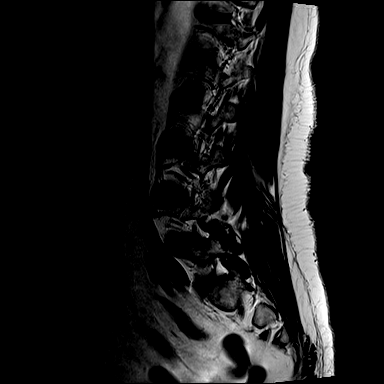
[im 15/15]
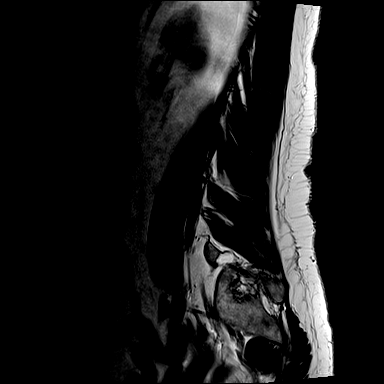

[Series 6: T1 · sagittal · 4.0mm · 0.81mm/px · 7 of 15 slices shown (1 of 2)]
[im 1/15]
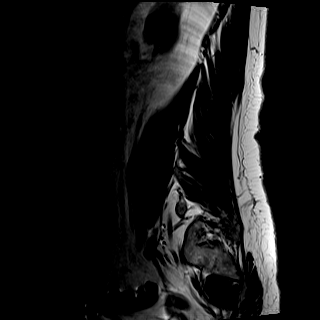
[im 3/15]
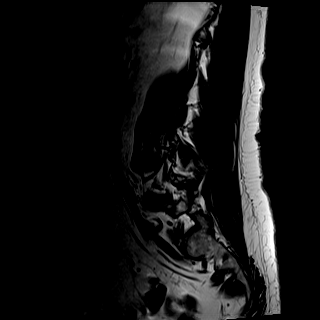
[im 5/15]
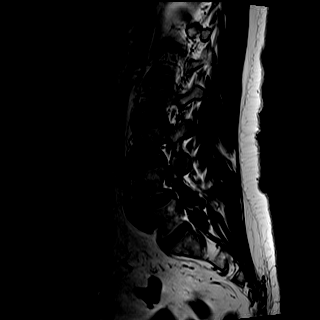
[im 8/15]
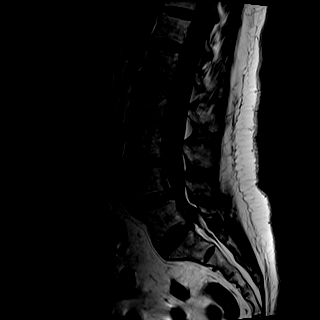
[im 10/15]
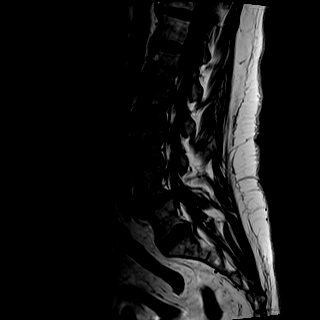
[im 12/15]
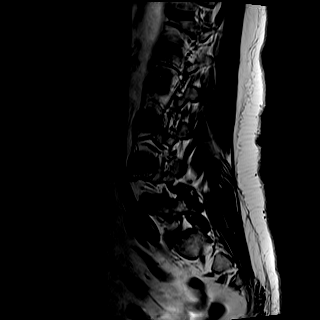
[im 15/15]
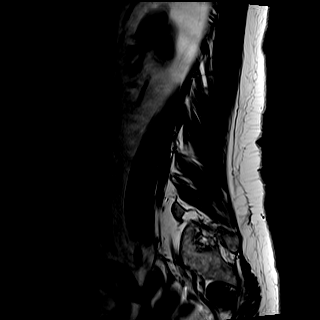

[Series 7: STIR · sagittal · 4.0mm · 0.51mm/px · 2 of 15 slices shown]
[im 1/15]
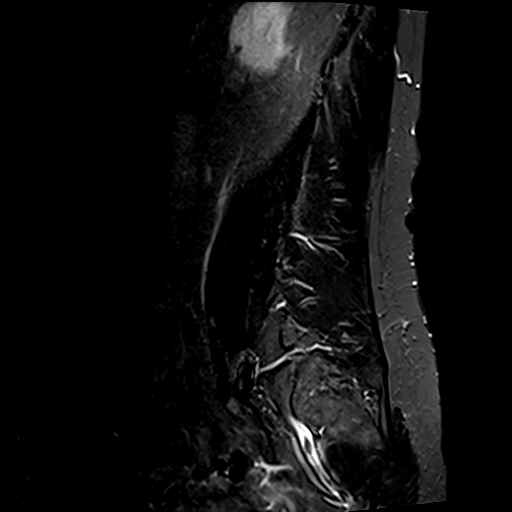
[im 3/15]
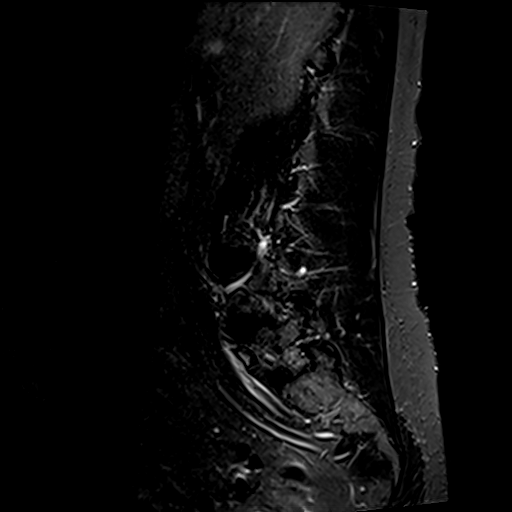

[Series 8: T2 · axial · 4.0mm · 0.70mm/px · z∈[-92,+88]mm · 8 of 31 slices shown (2 of 2)]
[im 1/31]
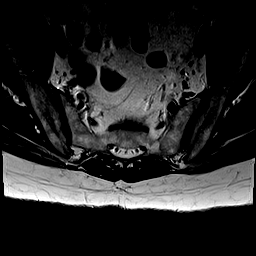
[im 5/31]
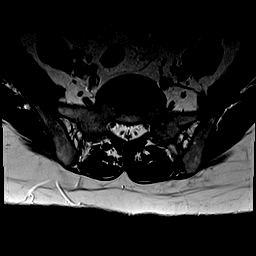
[im 10/31]
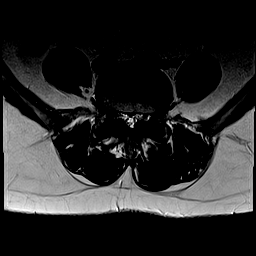
[im 14/31]
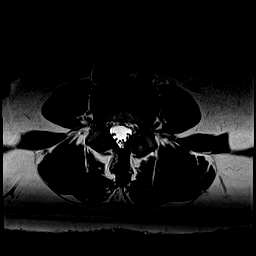
[im 17/31]
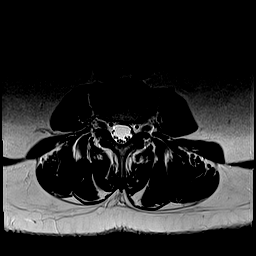
[im 21/31]
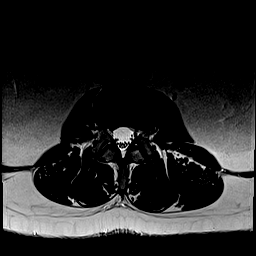
[im 26/31]
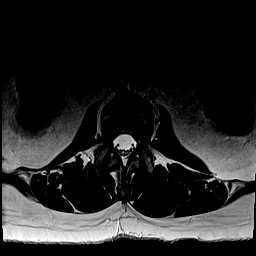
[im 31/31]
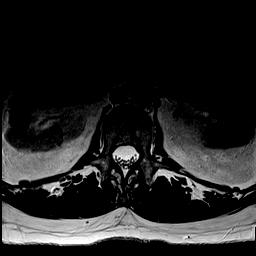

[Series 9: T1 · axial · 4.0mm · 0.35mm/px · z∈[-92,+88]mm · 8 of 31 slices shown (2 of 2)]
[im 1/31]
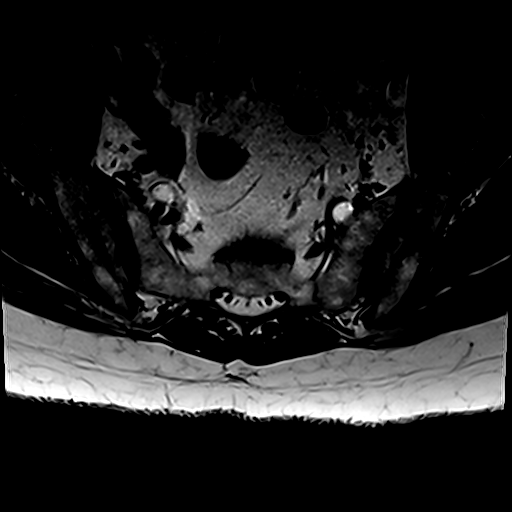
[im 5/31]
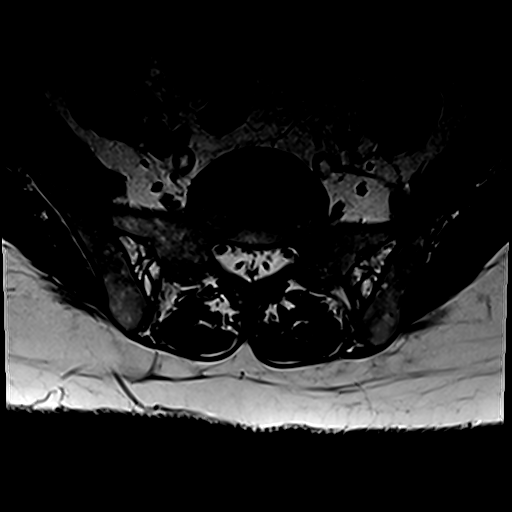
[im 10/31]
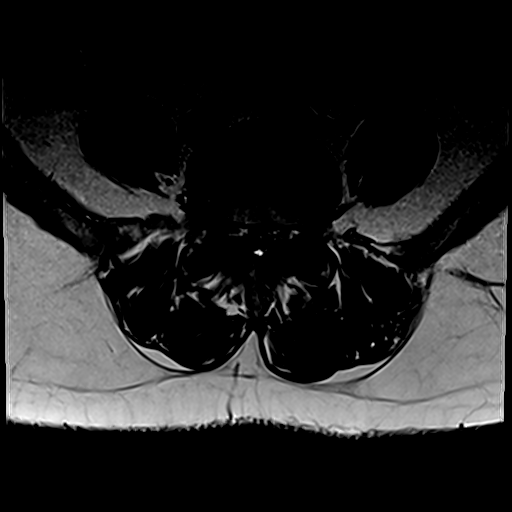
[im 14/31]
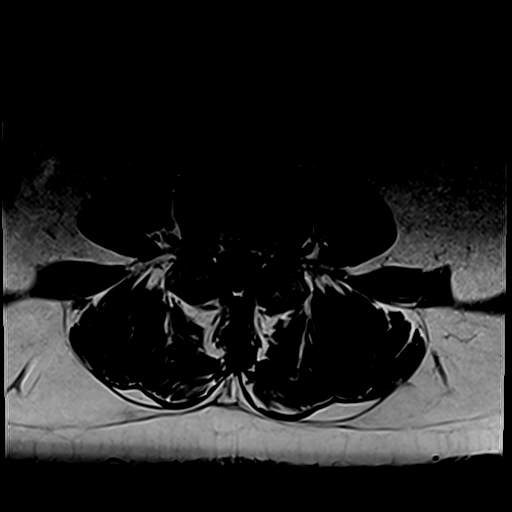
[im 17/31]
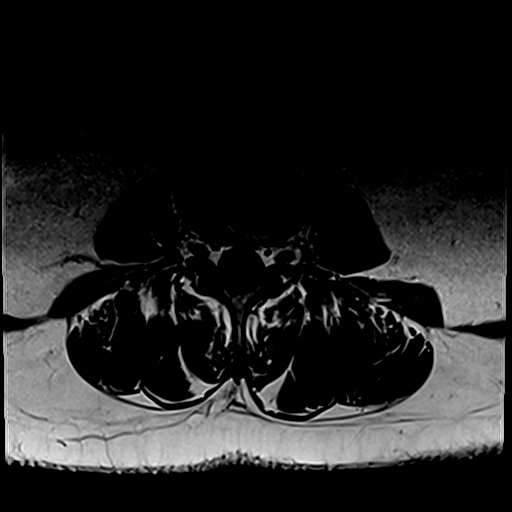
[im 21/31]
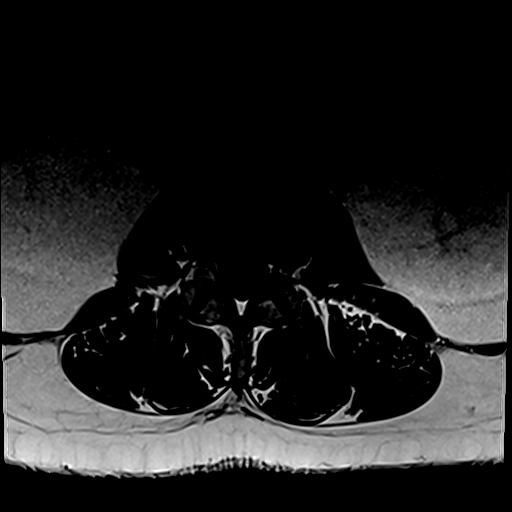
[im 26/31]
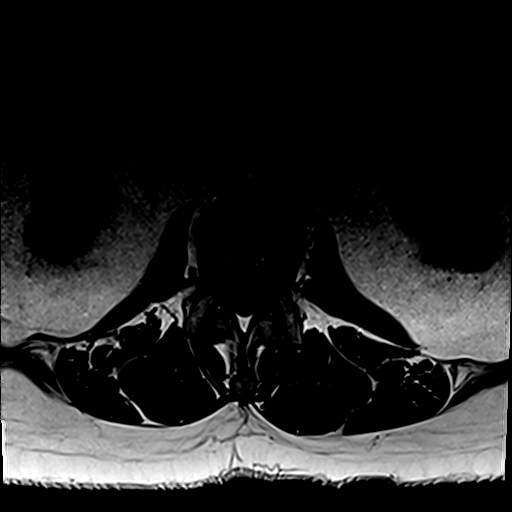
[im 31/31]
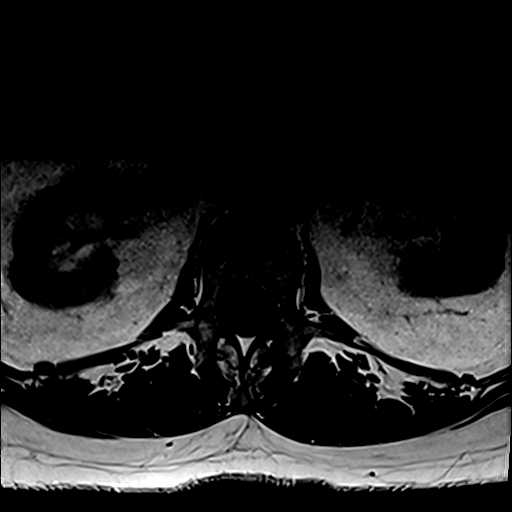

[31 of 48 positions shown; findings below may reference images not displayed]

FINDINGS: Segmentation:  Standard.

Alignment:  3 mm anterolisthesis at L4-L5.

Vertebrae:  No fracture, evidence of discitis, or bone lesion.

Conus medullaris and cauda equina: Conus extends to the T12 level.
Conus and cauda equina appear normal.

Paraspinal and other soft tissues: Negative.

Disc levels:

T12-L1 to L2-L3:  Negative.

L3-L4:  Mild disc bulging eccentric to the left.  No stenosis.

L4-L5: Disc uncovering and mild to moderate disc bulging. Severe
bilateral facet arthropathy with ligamentum flavum hypertrophy. Mild
to moderate spinal canal and bilateral lateral recess stenosis. Mild
bilateral neuroforaminal stenosis.

L5-S1: Small broad-based posterior disc protrusion. Mild bilateral
facet arthropathy. No stenosis.
IMPRESSION: 1. Disc and facet degeneration at L4-L5 with resultant mild to
moderate spinal canal and bilateral lateral recess stenosis.

## 2022-02-17 ENCOUNTER — Telehealth: Payer: Self-pay | Admitting: Nurse Practitioner

## 2022-02-17 DIAGNOSIS — E119 Type 2 diabetes mellitus without complications: Secondary | ICD-10-CM

## 2022-02-17 NOTE — Telephone Encounter (Signed)
?  Prescription Request ? ?02/17/2022 ? ?Is this a "Controlled Substance" medicine? yes ? ?Have you seen your PCP in the last 2 weeks? no ? ?If YES, route message to pool  -  If NO, patient needs to be scheduled for appointment. ? ?What is the name of the medication or equipment? Metformin (2 pills per day & Omeprazole (1 pill per day) ? ?Have you contacted your pharmacy to request a refill? yes  ? ?Which pharmacy would you like this sent to? [facility] in [place], [place] ? ? ?Patient notified that their request is being sent to the clinical staff for review and that they should receive a response within 2 business days.  ? ?Je's pt. ? ?She will be out tomorrow completely. She has been waiting on [facility] mail order RX's & they said that it has been sent out, but she hasn't got it in the mail yet. She called them. ? ?Please send one month supply to [facility]. ?  ?

## 2022-02-18 MED ORDER — OMEPRAZOLE 40 MG PO CPDR
40.0000 mg | DELAYED_RELEASE_CAPSULE | Freq: Every day | ORAL | 0 refills | Status: DC
Start: 2022-02-18 — End: 2022-04-22

## 2022-02-18 MED ORDER — METFORMIN HCL 500 MG PO TABS: 500.0000 mg | ORAL_TABLET | Freq: Two times a day (BID) | ORAL | 0 refills | Status: DC

## 2022-02-18 NOTE — Telephone Encounter (Signed)
Pt aware refill for 30 d sent to [facility] ?

## 2022-02-23 DIAGNOSIS — H401131 Primary open-angle glaucoma, bilateral, mild stage: Secondary | ICD-10-CM | POA: Diagnosis not present

## 2022-02-23 DIAGNOSIS — H2513 Age-related nuclear cataract, bilateral: Secondary | ICD-10-CM | POA: Diagnosis not present

## 2022-02-23 LAB — HM DIABETES EYE EXAM

## 2022-03-17 ENCOUNTER — Other Ambulatory Visit: Payer: Self-pay | Admitting: Nurse Practitioner

## 2022-03-17 ENCOUNTER — Telehealth: Payer: Self-pay | Admitting: Nurse Practitioner

## 2022-03-17 MED ORDER — LISINOPRIL-HYDROCHLOROTHIAZIDE 20-12.5 MG PO TABS: 1.0000 | ORAL_TABLET | Freq: Every day | ORAL | 1 refills | Status: DC

## 2022-03-17 NOTE — Telephone Encounter (Signed)
Pt needs refill on lisinopril-hydrochlorothiazide (PRINZIDE,ZESTORETIC) 20-12.5 MG tablet please fax rx to [facility] order. She gets rx free.

## 2022-03-17 NOTE — Telephone Encounter (Signed)
Medication has never been sent by our office- please advise

## 2022-03-23 DIAGNOSIS — Z1231 Encounter for screening mammogram for malignant neoplasm of breast: Secondary | ICD-10-CM | POA: Diagnosis not present

## 2022-03-30 ENCOUNTER — Telehealth: Payer: Self-pay | Admitting: Nurse Practitioner

## 2022-03-30 MED ORDER — DICLOFENAC SODIUM 75 MG PO TBEC: 75.0000 mg | DELAYED_RELEASE_TABLET | Freq: Every day | ORAL | 0 refills | Status: DC

## 2022-03-30 NOTE — Telephone Encounter (Signed)
  Prescription Request  03/30/2022   What is the name of the medication or equipment? DICLOFENAC  Have you contacted your pharmacy to request a refill? YES  Which pharmacy would you like this sent to? [facility]  Pt says she is almost out of her medicine and [facility] [place] says she doesn't have anymore refills. Pt requesting that refills be sent ASAP since it takes 7-10 days for [facility] to mail them to her. Wants to know if PCP can send in a 90 day supply so that she doesn't have to call [facility] every month to get refills sent.

## 2022-04-07 ENCOUNTER — Telehealth: Payer: Self-pay

## 2022-04-07 NOTE — Telephone Encounter (Signed)
Pt called my phone yesterday and asked if she could talk to you about some issues she is having. I advised the patient that I could send you a message to see what you think would be the best way to handle her situation. Pt states she is having issues with her stomach "again" and had a CT scan done by her previous provider because of this. I unfortunately could not find any documentation about this. I advised pt that she would probably need an appointment to see you to discuss and pt states she will just wait until her appointment on 04/22/2022. I am sorry that I couldn't be much help. Thank you.

## 2022-04-07 NOTE — Telephone Encounter (Signed)
Okay thank you, an OV will help but if she wants to wait till July that's fine as well, but in the case of emergency she can also go to ED

## 2022-04-08 NOTE — Telephone Encounter (Signed)
Spoke with patient, she said she is interested in having another CT scan of her abdomen but wants to wait until her appointment in July to discuss with PCP.  Offered appointment sooner, patient will call back if anything changes.

## 2022-04-22 ENCOUNTER — Ambulatory Visit (INDEPENDENT_AMBULATORY_CARE_PROVIDER_SITE_OTHER): Payer: Medicare Other | Admitting: Nurse Practitioner

## 2022-04-22 ENCOUNTER — Encounter: Payer: Self-pay | Admitting: Nurse Practitioner

## 2022-04-22 VITALS — BP 109/70 | HR 87 | Ht 63.0 in | Wt 147.2 lb

## 2022-04-22 DIAGNOSIS — K219 Gastro-esophageal reflux disease without esophagitis: Secondary | ICD-10-CM

## 2022-04-22 DIAGNOSIS — E119 Type 2 diabetes mellitus without complications: Secondary | ICD-10-CM

## 2022-04-22 DIAGNOSIS — R1084 Generalized abdominal pain: Secondary | ICD-10-CM | POA: Diagnosis not present

## 2022-04-22 DIAGNOSIS — I152 Hypertension secondary to endocrine disorders: Secondary | ICD-10-CM

## 2022-04-22 DIAGNOSIS — R300 Dysuria: Secondary | ICD-10-CM

## 2022-04-22 DIAGNOSIS — E1159 Type 2 diabetes mellitus with other circulatory complications: Secondary | ICD-10-CM | POA: Diagnosis not present

## 2022-04-22 DIAGNOSIS — F419 Anxiety disorder, unspecified: Secondary | ICD-10-CM

## 2022-04-22 DIAGNOSIS — Z780 Asymptomatic menopausal state: Secondary | ICD-10-CM | POA: Diagnosis not present

## 2022-04-22 LAB — URINALYSIS, COMPLETE
Bilirubin, UA: NEGATIVE
Glucose, UA: NEGATIVE
Ketones, UA: NEGATIVE
Nitrite, UA: NEGATIVE
Protein,UA: NEGATIVE
RBC, UA: NEGATIVE
Specific Gravity, UA: <1.005 — ABNORMAL LOW (ref 1.005–1.030)
Urobilinogen, Ur: 0.2 mg/dL (ref 0.2–1.0)
pH, UA: 5 (ref 5.0–7.5)

## 2022-04-22 LAB — MICROSCOPIC EXAMINATION
RBC, Urine: NONE SEEN /hpf (ref 0–2)
Renal Epithel, UA: NONE SEEN /hpf

## 2022-04-22 LAB — BAYER DCA HB A1C WAIVED: HB A1C (BAYER DCA - WAIVED): 5.4 % (ref 4.8–5.6)

## 2022-04-22 MED ORDER — CEPHALEXIN 500 MG PO CAPS: 500.0000 mg | ORAL_CAPSULE | Freq: Two times a day (BID) | ORAL | 0 refills | Status: DC

## 2022-04-22 MED ORDER — HYDROXYZINE HCL 10 MG PO TABS: 10.0000 mg | ORAL_TABLET | Freq: Three times a day (TID) | ORAL | 1 refills | Status: DC | PRN

## 2022-04-22 MED ORDER — PANTOPRAZOLE SODIUM 40 MG PO TBEC: 40.0000 mg | DELAYED_RELEASE_TABLET | Freq: Every day | ORAL | 3 refills | Status: DC

## 2022-04-22 MED ORDER — METFORMIN HCL 500 MG PO TABS: 500.0000 mg | ORAL_TABLET | Freq: Two times a day (BID) | ORAL | 0 refills | Status: DC

## 2022-04-22 NOTE — Assessment & Plan Note (Signed)
Urinalysis positive for leukocytes and bacteria

## 2022-04-22 NOTE — Progress Notes (Addendum)
Established Patient Office Visit  Subjective   Patient ID: Andrea Shannon, female    DOB: [birth year]  Age: 76 y.o. MRN: [id number]  Chief Complaint  Patient presents with   Medical Management of Chronic Issues    3 month   Anxiety   GI Problem    States this has been going on for years     Anxiety Presents for initial visit. Onset was 1 to 6 months ago. The problem has been unchanged. Symptoms include excessive worry, irritability, nervous/anxious behavior and restlessness. Patient reports no nausea. Symptoms occur most days. The most recent episode lasted 2 days. The severity of symptoms is moderate. The symptoms are aggravated by family issues. The patient sleeps 6 hours per night. The quality of sleep is good. Nighttime awakenings: occasional.   There are no known risk factors. Her past medical history is significant for depression. Past treatments include nothing.  GI Problem The primary symptoms include dysuria. Primary symptoms do not include nausea, vomiting or rash. The illness began 6 to 7 days ago. The onset was gradual.  The illness does not include chills, bloating, constipation or back pain. Significant associated medical issues include GERD.  Dysuria  This is a new problem. The current episode started in the past 7 days. The problem has been unchanged. The quality of the pain is described as aching. The pain is moderate. Pertinent negatives include no chills, nausea or vomiting. She has tried nothing for the symptoms.     Pt presents for follow up of hypertension. Patient was diagnosed in 10/21/2021 The patient is tolerating the medication well without side effects. Compliance with treatment has been good; including taking medication as directed , maintains a healthy diet and regular exercise regimen , and following up as directed.   GERD, Follow up:  The patient was last seen for GERD 3 months ago. Changes made since that visit include omeprazole 20 mg tablet by mouth  daily..  She reports fair compliance with treatment. She is not having side effects. .  She IS experiencing bilious reflux and heartburn. She is NOT experiencing early satiety, fullness after meals, hematemesis, or hoarseness  The patient presents with history of type 2 diabetes mellitus without complications. Patient was diagnosed in 10/20/2021. Compliance with treatment has been good; the patient takes medication as directed , maintains a diabetic diet and an exercise regimen , follows up as directed , and is keeping a glucose diary. Sugars runs 120-140. Patient specifically denies associated symptoms, including blurred vision, fatigue, polydipsia, polyphagia and polyuria . Patient denies hypoglycemia. In regard to preventative care, the patient performs foot self-exams daily and last ophthalmology exam was in last year.  Patient will schedule another appointment..   Abdominal Pain: Patient complains of abdominal pain. The pain is described as aching, and is 5/10 in intensity. Pain is located in the epigastric without radiation. Onset was a few weeks ago. Symptoms have been unchanged since. Aggravating factors: none.  Alleviating factors: proton pump inhibitors. Associated symptoms: none. The patient denies arthralgias, chills, and fever.     Patient Active Problem List   Diagnosis Date Noted   Gastroesophageal reflux disease without esophagitis 04/22/2022   Anxiety 04/22/2022   Generalized abdominal pain 04/22/2022   Controlled type 2 diabetes mellitus without complication, without long-term current use of insulin ([place]) 10/21/2021   Hypertension associated with diabetes ([place]) 10/21/2021   Establishing care with new doctor, encounter for 10/21/2021   OAB (overactive bladder) 04/15/2020   Urinary urgency 02/26/2020  Dysuria 02/26/2020   Abnormal thyroid biopsy 12/06/2018   Toxic multinodul goiter 09/26/2018   Past Medical History:  Diagnosis Date   Acid reflux    Arthritis    Diabetes  mellitus, type II ([place])    Hypertension    Hyperthyroidism    Hypothyroidism    Past Surgical History:  Procedure Laterality Date   ABDOMINAL HYSTERECTOMY     Social History   Tobacco Use   Smoking status: Never   Smokeless tobacco: Never  Vaping Use   Vaping Use: Never used  Substance Use Topics   Alcohol use: Not Currently   Drug use: Never   Social History   Socioeconomic History   Marital status: Widowed    Spouse name: Not on file   Number of children: 1   Years of education: 12   Highest education level: High school graduate  Occupational History   Not on file  Tobacco Use   Smoking status: Never   Smokeless tobacco: Never  Vaping Use   Vaping Use: Never used  Substance and Sexual Activity   Alcohol use: Not Currently   Drug use: Never   Sexual activity: Not Currently    Birth control/protection: Surgical  Other Topics Concern   Not on file  Social History Narrative   Not on file   Social Determinants of Health   Financial Resource Strain: Low Risk  (12/31/2021)   Overall Financial Resource Strain (CARDIA)    Difficulty of Paying Living Expenses: Not hard at all  Food Insecurity: No Food Insecurity (12/31/2021)   Hunger Vital Sign    Worried About Running Out of Food in the Last Year: Never true    [place] in the Last Year: Never true  Transportation Needs: No Transportation Needs (12/31/2021)   PRAPARE - Hydrologist (Medical): No    Lack of Transportation (Non-Medical): No  Physical Activity: Insufficiently Active (12/31/2021)   Exercise Vital Sign    Days of Exercise per Week: 2 days    Minutes of Exercise per Session: 60 min  Stress: No Stress Concern Present (12/31/2021)   [place]    Feeling of Stress : Not at all  Social Connections: Moderately Integrated (12/31/2021)   Social Connection and Isolation Panel [NHANES]    Frequency of  Communication with Friends and Family: More than three times a week    Frequency of Social Gatherings with Friends and Family: More than three times a week    Attends Religious Services: More than 4 times per year    Active Member of [employer] or Organizations: Yes    Attends Archivist Meetings: More than 4 times per year    Marital Status: Widowed  Intimate Partner Violence: Not At Risk (12/31/2021)   Humiliation, Afraid, Rape, and Kick questionnaire    Fear of Current or Ex-Partner: No    Emotionally Abused: No    Physically Abused: No    Sexually Abused: No   Family Status  Relation Name Status   Mother  Deceased   Father  Deceased   Sister x6 Alive   Brother x3 Alive   Son  Alive   MGM  Deceased   MGF  Deceased   PGM  Deceased   PGF  Deceased   Family History  Problem Relation Age of Onset   Diabetes Mother    Asthma Father    Heart disease Sister  CHF   Diabetes Sister    Asthma Sister    Arthritis Sister    Diabetes Son    No Known Allergies    Review of Systems  Constitutional:  Positive for irritability. Negative for chills.  HENT: Negative.    Gastrointestinal:  Negative for bloating, constipation, nausea and vomiting.  Genitourinary:  Positive for dysuria.  Musculoskeletal:  Negative for back pain.  Skin: Negative.  Negative for rash.  Psychiatric/Behavioral:  The patient is nervous/anxious.   All other systems reviewed and are negative.     Objective:     BP 109/70   Pulse 87   Ht 5' 3"  (1.6 m)   Wt 147 lb 3.2 oz (66.8 kg)   SpO2 98%   BMI 26.08 kg/m  BP Readings from Last 3 Encounters:  04/22/22 109/70  01/19/22 125/68  10/21/21 120/64   Wt Readings from Last 3 Encounters:  04/22/22 147 lb 3.2 oz (66.8 kg)  01/19/22 154 lb (69.9 kg)  12/31/21 158 lb (71.7 kg)      Physical Exam Vitals and nursing note reviewed.  Constitutional:      Appearance: Normal appearance.  HENT:     Head: Normocephalic.     Right Ear:  External ear normal.     Left Ear: External ear normal.     Nose: Nose normal.     Mouth/Throat:     Mouth: Mucous membranes are moist.     Pharynx: Oropharynx is clear.  Eyes:     Conjunctiva/sclera: Conjunctivae normal.  Cardiovascular:     Rate and Rhythm: Normal rate and regular rhythm.     Pulses: Normal pulses.     Heart sounds: Normal heart sounds.  Pulmonary:     Effort: Pulmonary effort is normal.     Breath sounds: Normal breath sounds.  Abdominal:     General: Bowel sounds are normal.     Tenderness: There is abdominal tenderness.  Skin:    General: Skin is warm.     Findings: No rash.  Neurological:     General: No focal deficit present.     Mental Status: She is alert and oriented to person, place, and time.  Psychiatric:        Behavior: Behavior normal.      Results for orders placed or performed in visit on 04/22/22  Microscopic Examination   Urine  Result Value Ref Range   WBC, UA 0-5 0 - 5 /hpf   RBC, Urine None seen 0 - 2 /hpf   Epithelial Cells (non renal) 0-10 0 - 10 /hpf   Renal Epithel, UA None seen None seen /hpf   Bacteria, UA Few (A) None seen/Few  CBC with Differential/Platelet  Result Value Ref Range   WBC 5.8 3.4 - 10.8 x10E3/uL   RBC 4.61 3.77 - 5.28 x10E6/uL   Hemoglobin 11.0 (L) 11.1 - 15.9 g/dL   Hematocrit 35.5 34.0 - 46.6 %   MCV 77 (L) 79 - 97 fL   MCH 23.9 (L) 26.6 - 33.0 pg   MCHC 31.0 (L) 31.5 - 35.7 g/dL   RDW 18.3 (H) 11.7 - 15.4 %   Platelets 508 (H) 150 - 450 x10E3/uL   Neutrophils 49 Not Estab. %   Lymphs 39 Not Estab. %   Monocytes 7 Not Estab. %   Eos 4 Not Estab. %   Basos 1 Not Estab. %   Neutrophils Absolute 2.9 1.4 - 7.0 x10E3/uL   Lymphocytes Absolute 2.2 0.7 -  3.1 x10E3/uL   Monocytes Absolute 0.4 0.1 - 0.9 x10E3/uL   EOS (ABSOLUTE) 0.2 0.0 - 0.4 x10E3/uL   Basophils Absolute 0.1 0.0 - 0.2 x10E3/uL   Immature Granulocytes 0 Not Estab. %   Immature Grans (Abs) 0.0 0.0 - 0.1 x10E3/uL  Lipid panel  Result  Value Ref Range   Cholesterol, Total 156 100 - 199 mg/dL   Triglycerides 144 0 - 149 mg/dL   HDL 50 >39 mg/dL   VLDL Cholesterol Cal 25 5 - 40 mg/dL   LDL Chol Calc (NIH) 81 0 - 99 mg/dL   Chol/HDL Ratio 3.1 0.0 - 4.4 ratio  Comprehensive metabolic panel  Result Value Ref Range   Glucose 63 (L) 70 - 99 mg/dL   BUN 16 8 - 27 mg/dL   Creatinine, Ser 1.21 (H) 0.57 - 1.00 mg/dL   eGFR 47 (L) >59 mL/min/1.73   BUN/Creatinine Ratio 13 12 - 28   Sodium 141 134 - 144 mmol/L   Potassium 4.4 3.5 - 5.2 mmol/L   Chloride 101 96 - 106 mmol/L   CO2 24 20 - 29 mmol/L   Calcium 9.7 8.7 - 10.3 mg/dL   Total Protein 7.1 6.0 - 8.5 g/dL   Albumin 4.7 3.7 - 4.7 g/dL   Globulin, Total 2.4 1.5 - 4.5 g/dL   Albumin/Globulin Ratio 2.0 1.2 - 2.2   Bilirubin Total <0.2 0.0 - 1.2 mg/dL   Alkaline Phosphatase 71 44 - 121 IU/L   AST 16 0 - 40 IU/L   ALT 16 0 - 32 IU/L  TSH  Result Value Ref Range   TSH 0.329 (L) 0.450 - 4.500 uIU/mL  Bayer DCA Hb A1c Waived  Result Value Ref Range   HB A1C (BAYER DCA - WAIVED) 5.4 4.8 - 5.6 %  Urinalysis, Complete  Result Value Ref Range   Specific Gravity, UA <1.005 (L) 1.005 - 1.030   pH, UA 5.0 5.0 - 7.5   Color, UA Yellow Yellow   Appearance Ur Clear Clear   Leukocytes,UA Trace (A) Negative   Protein,UA Negative Negative/Trace   Glucose, UA Negative Negative   Ketones, UA Negative Negative   RBC, UA Negative Negative   Bilirubin, UA Negative Negative   Urobilinogen, Ur 0.2 0.2 - 1.0 mg/dL   Nitrite, UA Negative Negative   Microscopic Examination See below:     Last CBC Lab Results  Component Value Date   WBC 5.8 04/22/2022   HGB 11.0 (L) 04/22/2022   HCT 35.5 04/22/2022   MCV 77 (L) 04/22/2022   MCH 23.9 (L) 04/22/2022   RDW 18.3 (H) 04/22/2022   PLT 508 (H) 53/74/8270   Last metabolic panel Lab Results  Component Value Date   GLUCOSE 63 (L) 04/22/2022   NA 141 04/22/2022   K 4.4 04/22/2022   CL 101 04/22/2022   CO2 24 04/22/2022   BUN 16  04/22/2022   CREATININE 1.21 (H) 04/22/2022   EGFR 47 (L) 04/22/2022   CALCIUM 9.7 04/22/2022   PROT 7.1 04/22/2022   ALBUMIN 4.7 04/22/2022   LABGLOB 2.4 04/22/2022   AGRATIO 2.0 04/22/2022   BILITOT <0.2 04/22/2022   ALKPHOS 71 04/22/2022   AST 16 04/22/2022   ALT 16 04/22/2022   Last lipids Lab Results  Component Value Date   CHOL 156 04/22/2022   HDL 50 04/22/2022   LDLCALC 81 04/22/2022   TRIG 144 04/22/2022   CHOLHDL 3.1 04/22/2022   Last hemoglobin A1c Lab Results  Component Value Date  HGBA1C 5.4 04/22/2022   Last thyroid functions Lab Results  Component Value Date   TSH 0.329 (L) 04/22/2022   Last vitamin D Lab Results  Component Value Date   VD25OH 65.9 03/13/2021      The 10-year ASCVD risk score (Arnett DK, et al., 2019) is: 20.1%    Assessment & Plan:   UTI  vs  interstitial cystitis -urinalysis completed results pending -pyridium for pain -Keflex  -precaution and education provided -All questions answered -follow up with unresolved symptoms  Problem List Items Addressed This Visit       Cardiovascular and Mediastinum   Hypertension associated with diabetes ([place])    Hypertension well-controlled on current medication no changes necessary.  Completed labs-CBC CMP lipid panel-results pending.  Follow-up in 3 months.      Relevant Orders   TSH (Completed)     Digestive   Gastroesophageal reflux disease without esophagitis    GERD symptoms not well controlled, patient's medication is not treating symptoms at this time, I will start patient on 40 mg Protonix, from 20 mg omeprazole, once daily. .      Relevant Medications   pantoprazole (PROTONIX) 40 MG tablet     Endocrine   Controlled type 2 diabetes mellitus without complication, without long-term current use of insulin ([facility]) - Primary    A1c well controlled changed from 7 to 5.4%, changed metformin to 400 mg tablet by mouth once daily with break fast. continue diabetic diet and  exeresis, follow up in 3 months.      Relevant Orders   CBC with Differential/Platelet (Completed)   Lipid panel (Completed)   Comprehensive metabolic panel (Completed)   Bayer DCA Hb A1c Waived (Completed)     Other   Dysuria    Urinalysis positive for leukocytes and bacteria      Relevant Medications   cephALEXin (KEFLEX) 500 MG capsule   Other Relevant Orders   Urinalysis, Complete (Completed)   Microscopic Examination (Completed)   Anxiety    Symptoms of anxiety not well controlled.  Patient reports new onset of anxiety with changes in family dynamics.  She reports that she is currently easily irritated and has a short temper.  Completed GAD-7 started patient on hydroxyzine 10 mg tablet as needed.  Follow-up in 6 weeks.      Relevant Medications   hydrOXYzine (ATARAX) 10 MG tablet   Generalized abdominal pain    For generalized abdominal pain advised patient to use Tylenol or ibuprofen.  Completed urinalysis.  Results abnormal positive for leukocytes and bacteria.  Started patient on Keflex urine cultures completed results pending.      Other Visit Diagnoses     Post-menopausal           Return in about 3 months (around 07/23/2022) for chronic disease management.    Ivy Lynn, NP

## 2022-04-22 NOTE — Assessment & Plan Note (Signed)
Symptoms of anxiety not well controlled.  Patient reports new onset of anxiety with changes in family dynamics.  She reports that she is currently easily irritated and has a short temper.  Completed GAD-7 started patient on hydroxyzine 10 mg tablet as needed.  Follow-up in 6 weeks.

## 2022-04-22 NOTE — Assessment & Plan Note (Signed)
For generalized abdominal pain advised patient to use Tylenol or ibuprofen.  Completed urinalysis.  Results abnormal positive for leukocytes and bacteria.  Started patient on Keflex urine cultures completed results pending.

## 2022-04-22 NOTE — Patient Instructions (Signed)
Gastroesophageal Reflux Disease, Adult Gastroesophageal reflux (GER) happens when acid from the stomach flows up into the tube that connects the mouth and the stomach (esophagus). Normally, food travels down the esophagus and stays in the stomach to be digested. However, when a person has GER, food and stomach acid sometimes move back up into the esophagus. If this becomes a more serious problem, the person may be diagnosed with a disease called gastroesophageal reflux disease (GERD). GERD occurs when the reflux: Happens often. Causes frequent or severe symptoms. Causes problems such as damage to the esophagus. When stomach acid comes in contact with the esophagus, the acid may cause inflammation in the esophagus. Over time, GERD may create small holes (ulcers) in the lining of the esophagus. What are the causes? This condition is caused by a problem with the muscle between the esophagus and the stomach (lower esophageal sphincter, or LES). Normally, the LES muscle closes after food passes through the esophagus to the stomach. When the LES is weakened or abnormal, it does not close properly, and that allows food and stomach acid to go back up into the esophagus. The LES can be weakened by certain dietary substances, medicines, and medical conditions, including: Tobacco use. Pregnancy. Having a hiatal hernia. Alcohol use. Certain foods and beverages, such as coffee, chocolate, onions, and peppermint. What increases the risk? You are more likely to develop this condition if you: Have an increased body weight. Have a connective tissue disorder. Take NSAIDs, such as ibuprofen. What are the signs or symptoms? Symptoms of this condition include: Heartburn. Difficult or painful swallowing and the feeling of having a lump in the throat. A bitter taste in the mouth. Bad breath and having a large amount of saliva. Having an upset or bloated stomach and belching. Chest pain. Different conditions can  cause chest pain. Make sure you see your health care provider if you experience chest pain. Shortness of breath or wheezing. Ongoing (chronic) cough or a nighttime cough. Wearing away of tooth enamel. Weight loss. How is this diagnosed? This condition may be diagnosed based on a medical history and a physical exam. To determine if you have mild or severe GERD, your health care provider may also monitor how you respond to treatment. You may also have tests, including: A test to examine your stomach and esophagus with a small camera (endoscopy). A test that measures the acidity level in your esophagus. A test that measures how much pressure is on your esophagus. A barium swallow or modified barium swallow test to show the shape, size, and functioning of your esophagus. How is this treated? Treatment for this condition may vary depending on how severe your symptoms are. Your health care provider may recommend: Changes to your diet. Medicine. Surgery. The goal of treatment is to help relieve your symptoms and to prevent complications. Follow these instructions at home: Eating and drinking  Follow a diet as recommended by your health care provider. This may involve avoiding foods and drinks such as: Coffee and tea, with or without caffeine. Drinks that contain alcohol. Energy drinks and sports drinks. Carbonated drinks or sodas. Chocolate and cocoa. Peppermint and mint flavorings. Garlic and onions. Horseradish. Spicy and acidic foods, including peppers, chili powder, curry powder, vinegar, hot sauces, and barbecue sauce. Citrus fruit juices and citrus fruits, such as oranges, lemons, and limes. Tomato-based foods, such as red sauce, chili, salsa, and pizza with red sauce. Fried and fatty foods, such as donuts, french fries, potato chips, and high-fat dressings.   High-fat meats, such as hot dogs and fatty cuts of red and white meats, such as rib eye steak, sausage, ham, and  bacon. High-fat dairy items, such as whole milk, butter, and cream cheese. Eat small, frequent meals instead of large meals. Avoid drinking large amounts of liquid with your meals. Avoid eating meals during the 2-3 hours before bedtime. Avoid lying down right after you eat. Do not exercise right after you eat. Lifestyle  Do not use any products that contain nicotine or tobacco. These products include cigarettes, chewing tobacco, and vaping devices, such as e-cigarettes. If you need help quitting, ask your health care provider. Try to reduce your stress by using methods such as yoga or meditation. If you need help reducing stress, ask your health care provider. If you are overweight, reduce your weight to an amount that is healthy for you. Ask your health care provider for guidance about a safe weight loss goal. General instructions Pay attention to any changes in your symptoms. Take over-the-counter and prescription medicines only as told by your health care provider. Do not take aspirin, ibuprofen, or other NSAIDs unless your health care provider told you to take these medicines. Wear loose-fitting clothing. Do not wear anything tight around your waist that causes pressure on your abdomen. Raise (elevate) the head of your bed about 6 inches (15 cm). You can use a wedge to do this. Avoid bending over if this makes your symptoms worse. Keep all follow-up visits. This is important. Contact a health care provider if: You have: New symptoms. Unexplained weight loss. Difficulty swallowing or it hurts to swallow. Wheezing or a persistent cough. A hoarse voice. Your symptoms do not improve with treatment. Get help right away if: You have sudden pain in your arms, neck, jaw, teeth, or back. You suddenly feel sweaty, dizzy, or light-headed. You have chest pain or shortness of breath. You vomit and the vomit is green, yellow, or black, or it looks like blood or coffee grounds. You faint. You  have stool that is red, bloody, or black. You cannot swallow, drink, or eat. These symptoms may represent a serious problem that is an emergency. Do not wait to see if the symptoms will go away. Get medical help right away. Call your local emergency services (911 in the [place] Do not drive yourself to the hospital. Summary Gastroesophageal reflux happens when acid from the stomach flows up into the esophagus. GERD is a disease in which the reflux happens often, causes frequent or severe symptoms, or causes problems such as damage to the esophagus. Treatment for this condition may vary depending on how severe your symptoms are. Your health care provider may recommend diet and lifestyle changes, medicine, or surgery. Contact a health care provider if you have new or worsening symptoms. Take over-the-counter and prescription medicines only as told by your health care provider. Do not take aspirin, ibuprofen, or other NSAIDs unless your health care provider told you to do so. Keep all follow-up visits as told by your health care provider. This is important. This information is not intended to replace advice given to you by your health care provider. Make sure you discuss any questions you have with your health care provider. Document Revised: 04/15/2020 Document Reviewed: 04/15/2020 Elsevier Patient Education  2023 [employer] Inc. Diabetes Mellitus Basics  Diabetes mellitus, or diabetes, is a long-term (chronic) disease. It occurs when the body does not properly use sugar (glucose) that is released from food after you eat. Diabetes mellitus may   be caused by one or both of these problems: Your pancreas does not make enough of a hormone called insulin. Your body does not react in a normal way to the insulin that it makes. Insulin lets glucose enter cells in your body. This gives you energy. If you have diabetes, glucose cannot get into cells. This causes high blood glucose (hyperglycemia). How to treat  and manage diabetes You may need to take insulin or other diabetes medicines daily to keep your glucose in balance. If you are prescribed insulin, you will learn how to give yourself insulin by injection. You may need to adjust the amount of insulin you take based on the foods that you eat. You will need to check your blood glucose levels using a glucose monitor as told by your health care provider. The readings can help determine if you have low or high blood glucose. Generally, you should have these blood glucose levels: Before meals (preprandial): 80-130 mg/dL (4.4-7.2 mmol/L). After meals (postprandial): below 180 mg/dL (10 mmol/L). Hemoglobin A1c (HbA1c) level: less than 7%. Your health care provider will set treatment goals for you. Keep all follow-up visits. This is important. Follow these instructions at home: Diabetes medicines Take your diabetes medicines every day as told by your health care provider. List your diabetes medicines here: Name of medicine: ______________________________ Amount (dose): _______________ Time (a.m./p.m.): _______________ Notes: ___________________________________ Name of medicine: ______________________________ Amount (dose): _______________ Time (a.m./p.m.): _______________ Notes: ___________________________________ Name of medicine: ______________________________ Amount (dose): _______________ Time (a.m./p.m.): _______________ Notes: ___________________________________ Insulin If you use insulin, list the types of insulin you use here: Insulin type: ______________________________ Amount (dose): _______________ Time (a.m./p.m.): _______________Notes: ___________________________________ Insulin type: ______________________________ Amount (dose): _______________ Time (a.m./p.m.): _______________ Notes: ___________________________________ Insulin type: ______________________________ Amount (dose): _______________ Time (a.m./p.m.): _______________ Notes:  ___________________________________ Insulin type: ______________________________ Amount (dose): _______________ Time (a.m./p.m.): _______________ Notes: ___________________________________ Insulin type: ______________________________ Amount (dose): _______________ Time (a.m./p.m.): _______________ Notes: ___________________________________ Managing blood glucose  Check your blood glucose levels using a glucose monitor as told by your health care provider. Write down the times that you check your glucose levels here: Time: _______________ Notes: ___________________________________ Time: _______________ Notes: ___________________________________ Time: _______________ Notes: ___________________________________ Time: _______________ Notes: ___________________________________ Time: _______________ Notes: ___________________________________ Time: _______________ Notes: ___________________________________  Low blood glucose Low blood glucose (hypoglycemia) is when glucose is at or below 70 mg/dL (3.9 mmol/L). Symptoms may include: Feeling: Hungry. Sweaty and clammy. Irritable or easily upset. Dizzy. Sleepy. Having: A fast heartbeat. A headache. A change in your vision. Numbness around the mouth, lips, or tongue. Having trouble with: Moving (coordination). Sleeping. Treating low blood glucose To treat low blood glucose, eat or drink something containing sugar right away. If you can think clearly and swallow safely, follow the 15:15 rule: Take 15 grams of a fast-acting carb (carbohydrate), as told by your health care provider. Some fast-acting carbs are: Glucose tablets: take 3-4 tablets. Hard candy: eat 3-5 pieces. Fruit juice: drink 4 oz (120 mL). Regular (not diet) soda: drink 4-6 oz (120-180 mL). Honey or sugar: eat 1 Tbsp (15 mL). Check your blood glucose levels 15 minutes after you take the carb. If your glucose is still at or below 70 mg/dL (3.9 mmol/L), take 15 grams of a  carb again. If your glucose does not go above 70 mg/dL (3.9 mmol/L) after 3 tries, get help right away. After your glucose goes back to normal, eat a meal or a snack within 1 hour. Treating very low blood glucose If your glucose is at or below   54 mg/dL (3 mmol/L), you have very low blood glucose (severe hypoglycemia). This is an emergency. Do not wait to see if the symptoms will go away. Get medical help right away. Call your local emergency services (911 in the [place] Do not drive yourself to the hospital. Questions to ask your health care provider Should I talk with a diabetes educator? What equipment will I need to care for myself at home? What diabetes medicines do I need? When should I take them? How often do I need to check my blood glucose levels? What number can I call if I have questions? When is my follow-up visit? Where can I find a support group for people with diabetes? Where to find more information American Diabetes Association: www.diabetes.org Association of Diabetes Care and Education Specialists: www.diabeteseducator.org Contact a health care provider if: Your blood glucose is at or above 240 mg/dL (13.3 mmol/L) for 2 days in a row. You have been sick or have had a fever for 2 days or more, and you are not getting better. You have any of these problems for more than 6 hours: You cannot eat or drink. You feel nauseous. You vomit. You have diarrhea. Get help right away if: Your blood glucose is lower than 54 mg/dL (3 mmol/L). You get confused. You have trouble thinking clearly. You have trouble breathing. These symptoms may represent a serious problem that is an emergency. Do not wait to see if the symptoms will go away. Get medical help right away. Call your local emergency services (911 in the [place] Do not drive yourself to the hospital. Summary Diabetes mellitus is a chronic disease that occurs when the body does not properly use sugar (glucose) that is released  from food after you eat. Take insulin and diabetes medicines as told. Check your blood glucose every day, as often as told. Keep all follow-up visits. This is important. This information is not intended to replace advice given to you by your health care provider. Make sure you discuss any questions you have with your health care provider. Document Revised: 02/06/2020 Document Reviewed: 02/06/2020 Elsevier Patient Education  2023 Elsevier Inc.  

## 2022-04-22 NOTE — Assessment & Plan Note (Addendum)
GERD symptoms not well controlled, patient's medication is not treating symptoms at this time, I will start patient on 40 mg Protonix, from 20 mg omeprazole, once daily. Marland Kitchen

## 2022-04-22 NOTE — Assessment & Plan Note (Addendum)
A1c well controlled changed from 7 to 5.4%, changed metformin to 400 mg tablet by mouth once daily with break fast. continue diabetic diet and exeresis, follow up in 3 months.

## 2022-04-22 NOTE — Assessment & Plan Note (Addendum)
Hypertension well-controlled on current medication no changes necessary.  Completed labs-CBC CMP lipid panel-results pending.  Follow-up in 3 months.

## 2022-04-23 ENCOUNTER — Telehealth: Payer: Self-pay | Admitting: Nurse Practitioner

## 2022-04-23 LAB — CBC WITH DIFFERENTIAL/PLATELET
Basophils Absolute: 0.1 10*3/uL (ref 0.0–0.2)
Basos: 1 %
EOS (ABSOLUTE): 0.2 10*3/uL (ref 0.0–0.4)
Eos: 4 %
Hematocrit: 35.5 % (ref 34.0–46.6)
Hemoglobin: 11 g/dL — ABNORMAL LOW (ref 11.1–15.9)
Immature Grans (Abs): 0 10*3/uL (ref 0.0–0.1)
Immature Granulocytes: 0 %
Lymphocytes Absolute: 2.2 10*3/uL (ref 0.7–3.1)
Lymphs: 39 %
MCH: 23.9 pg — ABNORMAL LOW (ref 26.6–33.0)
MCHC: 31 g/dL — ABNORMAL LOW (ref 31.5–35.7)
MCV: 77 fL — ABNORMAL LOW (ref 79–97)
Monocytes Absolute: 0.4 10*3/uL (ref 0.1–0.9)
Monocytes: 7 %
Neutrophils Absolute: 2.9 10*3/uL (ref 1.4–7.0)
Neutrophils: 49 %
Platelets: 508 10*3/uL — ABNORMAL HIGH (ref 150–450)
RBC: 4.61 x10E6/uL (ref 3.77–5.28)
RDW: 18.3 % — ABNORMAL HIGH (ref 11.7–15.4)
WBC: 5.8 10*3/uL (ref 3.4–10.8)

## 2022-04-23 LAB — COMPREHENSIVE METABOLIC PANEL
ALT: 16 IU/L (ref 0–32)
AST: 16 IU/L (ref 0–40)
Albumin/Globulin Ratio: 2 (ref 1.2–2.2)
Albumin: 4.7 g/dL (ref 3.7–4.7)
Alkaline Phosphatase: 71 IU/L (ref 44–121)
BUN/Creatinine Ratio: 13 (ref 12–28)
BUN: 16 mg/dL (ref 8–27)
Bilirubin Total: <0.2 mg/dL (ref 0.0–1.2)
CO2: 24 mmol/L (ref 20–29)
Calcium: 9.7 mg/dL (ref 8.7–10.3)
Chloride: 101 mmol/L (ref 96–106)
Creatinine, Ser: 1.21 mg/dL — ABNORMAL HIGH (ref 0.57–1.00)
Globulin, Total: 2.4 g/dL (ref 1.5–4.5)
Glucose: 63 mg/dL — ABNORMAL LOW (ref 70–99)
Potassium: 4.4 mmol/L (ref 3.5–5.2)
Sodium: 141 mmol/L (ref 134–144)
Total Protein: 7.1 g/dL (ref 6.0–8.5)
eGFR: 47 mL/min/{1.73_m2} — ABNORMAL LOW (ref >59)

## 2022-04-23 LAB — LIPID PANEL
Chol/HDL Ratio: 3.1 ratio (ref 0.0–4.4)
Cholesterol, Total: 156 mg/dL (ref 100–199)
HDL: 50 mg/dL (ref >39)
LDL Chol Calc (NIH): 81 mg/dL (ref 0–99)
Triglycerides: 144 mg/dL (ref 0–149)
VLDL Cholesterol Cal: 25 mg/dL (ref 5–40)

## 2022-04-23 LAB — TSH: TSH: 0.329 u[IU]/mL — ABNORMAL LOW (ref 0.450–4.500)

## 2022-04-23 NOTE — Telephone Encounter (Signed)
Patient was needing information on new meds that were given yesterday. Just wanted to double check and see what each was for and when to take them. Directions given to patient and patient verbalized understanding.

## 2022-04-24 ENCOUNTER — Other Ambulatory Visit: Payer: Self-pay | Admitting: Nurse Practitioner

## 2022-04-24 DIAGNOSIS — E039 Hypothyroidism, unspecified: Secondary | ICD-10-CM | POA: Insufficient documentation

## 2022-04-24 DIAGNOSIS — E0520 Thyrotoxicosis with toxic multinodular goiter without thyrotoxic crisis or storm: Secondary | ICD-10-CM

## 2022-04-24 MED ORDER — LEVOTHYROXINE SODIUM 50 MCG PO TABS: 50.0000 ug | ORAL_TABLET | Freq: Every day | ORAL | 3 refills | Status: DC

## 2022-04-27 ENCOUNTER — Telehealth: Payer: Self-pay | Admitting: Nurse Practitioner

## 2022-04-27 ENCOUNTER — Other Ambulatory Visit: Payer: Self-pay | Admitting: Nurse Practitioner

## 2022-04-27 DIAGNOSIS — E1040 Type 1 diabetes mellitus with diabetic neuropathy, unspecified: Secondary | ICD-10-CM

## 2022-04-27 MED ORDER — GABAPENTIN 300 MG PO CAPS: 300.0000 mg | ORAL_CAPSULE | Freq: Three times a day (TID) | ORAL | 3 refills | Status: DC

## 2022-04-27 NOTE — Telephone Encounter (Signed)
I sent her Gabapentin to pharmacy 300 mg 3x /day, she can start with once or twice a day before moving up to 3 times a day if she wants to

## 2022-04-27 NOTE — Telephone Encounter (Signed)
Patient states that we do not manage her Thyroid so she was not going to change her dose.  She has a follow up with Dr. Dorris Fetch in December and will let him decide about the dose she should be taking.  Also would like to know if something was going to be sent in for her bilateral feet burning that has been ongoing.  States it was discussed in the visit.

## 2022-04-27 NOTE — Telephone Encounter (Signed)
Patient aware.

## 2022-04-27 NOTE — Telephone Encounter (Signed)
Patient said she is not planning to take levothyroxine (SYNTHROID) 50 MCG tablet because her thyroid doctor has already called it in for 75 mg. She also thought that something was supposed to be called in for her foot, unsure what the name of the medication was. Please call back and advise.

## 2022-04-28 ENCOUNTER — Telehealth: Payer: Self-pay

## 2022-04-28 NOTE — Telephone Encounter (Signed)
I just reviewed her labs.  Her TSH level is consistent with what it has been previously, no free T4 level drawn.  Therefore, I do not recommend any dosage change at this time.

## 2022-04-28 NOTE — Telephone Encounter (Signed)
Patient called stating she seen her PCP @ [place]. She states they did thyroid lab work that came back abnormal. She stated they wanted to change her medication, but she told the provider she would contact our office first before changing her medications. Labs can be seen in epic.  Thanks

## 2022-04-28 NOTE — Telephone Encounter (Signed)
Patient informed and will keep her appointment for December 2023.  Thanks

## 2022-04-28 NOTE — Progress Notes (Signed)
Great!

## 2022-05-18 ENCOUNTER — Telehealth: Payer: Self-pay | Admitting: Nurse Practitioner

## 2022-05-18 DIAGNOSIS — E119 Type 2 diabetes mellitus without complications: Secondary | ICD-10-CM

## 2022-05-18 MED ORDER — METFORMIN HCL 500 MG PO TABS: 500.0000 mg | ORAL_TABLET | Freq: Two times a day (BID) | ORAL | 0 refills | Status: DC

## 2022-05-18 NOTE — Telephone Encounter (Signed)
Pt aware 30 d sent to [facility]. Made pt aware 90 d was sent to [facility] in April with 3 refills. Pt will contact [facility] about her 3 mos supply.

## 2022-05-18 NOTE — Telephone Encounter (Signed)
  Prescription Request  05/18/2022  Is this a "Controlled Substance" medicine? metFORMIN (GLUCOPHAGE) 500 MG tablet  Have you seen your PCP in the last 2 weeks? 04/22/2022  If YES, route message to pool  -  If NO, patient needs to be scheduled for appointment.  What is the name of the medication or equipment? metFORMIN (GLUCOPHAGE) 500 MG tablet  Have you contacted your pharmacy to request a refill? no   Which pharmacy would you like this sent to? [facility] 30 day refill--pt needs till medication comes in the mail from [facility] for 90 day supply.   Patient notified that their request is being sent to the clinical staff for review and that they should receive a response within 2 business days.

## 2022-05-19 NOTE — Telephone Encounter (Signed)
Pt called back in today about her request for her Metformin RF from yesterday for 30 d supply to [place] and 90 d supply to [place]. She says it should be for the New Metformin that Je changed her to at her last visit. I asked her how she was to be taking it, she said 1 tablet twice a day, but it was to be the New Metformin and that we need to get it straight with Je Please advise

## 2022-05-20 ENCOUNTER — Other Ambulatory Visit: Payer: Self-pay | Admitting: Nurse Practitioner

## 2022-05-20 DIAGNOSIS — E119 Type 2 diabetes mellitus without complications: Secondary | ICD-10-CM

## 2022-05-20 MED ORDER — METFORMIN HCL 500 MG PO TABS: 500.0000 mg | ORAL_TABLET | Freq: Every day | ORAL | 0 refills | Status: DC

## 2022-05-20 NOTE — Telephone Encounter (Signed)
I tried to call patient but it went to voice mail, I sent in Metformin. The new metformin she is talking about is 500 mg tablet by mouth once daily because her A1c is now 5.4% and she will follow up in 3 months to repeat labs to make sure her  glucose is well controlled, I hope this helps.  Thank you

## 2022-05-26 MED ORDER — METFORMIN HCL 500 MG PO TABS: 500.0000 mg | ORAL_TABLET | Freq: Every day | ORAL | 0 refills | Status: DC

## 2022-05-26 NOTE — Addendum Note (Signed)
Addended by: Antonietta Barcelona D on: 05/26/2022 10:07 AM   Modules accepted: Orders

## 2022-05-26 NOTE — Progress Notes (Signed)
TC from pt about her Metformin, she didn't get the call about it, she needed the new Rx to go to [facility] since she does not have to pay anything for it. Rx sent.

## 2022-06-14 ENCOUNTER — Other Ambulatory Visit: Payer: Self-pay | Admitting: Nurse Practitioner

## 2022-06-14 DIAGNOSIS — E119 Type 2 diabetes mellitus without complications: Secondary | ICD-10-CM

## 2022-06-15 ENCOUNTER — Telehealth: Payer: Self-pay | Admitting: Nurse Practitioner

## 2022-06-15 DIAGNOSIS — K219 Gastro-esophageal reflux disease without esophagitis: Secondary | ICD-10-CM

## 2022-06-15 MED ORDER — PANTOPRAZOLE SODIUM 40 MG PO TBEC: 40.0000 mg | DELAYED_RELEASE_TABLET | Freq: Every day | ORAL | 0 refills | Status: DC

## 2022-06-15 NOTE — Telephone Encounter (Signed)
Aware refill sent to [facility]

## 2022-06-15 NOTE — Telephone Encounter (Signed)
  Prescription Request  06/15/2022  Is this a "Controlled Substance" medicine? no  Have you seen your PCP in the last 2 weeks? july  If YES, route message to pool  -  If NO, patient needs to be scheduled for appointment.  What is the name of the medication or equipment? pantoprazole  Have you contacted your pharmacy to request a refill? no   Which pharmacy would you like this sent to? [facility]   Patient notified that their request is being sent to the clinical staff for review and that they should receive a response within 2 business days.

## 2022-06-23 ENCOUNTER — Telehealth: Payer: Self-pay | Admitting: Nurse Practitioner

## 2022-06-23 NOTE — Telephone Encounter (Signed)
  Prescription Request  06/23/2022  Is this a "Controlled Substance" medicine? no  Have you seen your PCP in the last 2 weeks? no  If YES, route message to pool  -  If NO, patient needs to be scheduled for appointment.  What is the name of the medication or equipment? Diclofenac 75 MG EC tablet #90  Have you contacted your pharmacy to request a refill? NO   Which pharmacy would you like this sent to? [facility] by mail- [facility]   Patient notified that their request is being sent to the clinical staff for review and that they should receive a response within 2 business days.

## 2022-06-24 MED ORDER — DICLOFENAC SODIUM 75 MG PO TBEC: 75.0000 mg | DELAYED_RELEASE_TABLET | Freq: Every day | ORAL | 0 refills | Status: DC

## 2022-06-24 NOTE — Telephone Encounter (Signed)
Sent meds in needs appointment in October with Je for med check

## 2022-06-24 NOTE — Telephone Encounter (Signed)
Pt scheduled to see PCP on 07/22/22

## 2022-07-06 DIAGNOSIS — Z780 Asymptomatic menopausal state: Secondary | ICD-10-CM | POA: Diagnosis not present

## 2022-07-06 DIAGNOSIS — Z09 Encounter for follow-up examination after completed treatment for conditions other than malignant neoplasm: Secondary | ICD-10-CM | POA: Diagnosis not present

## 2022-07-07 ENCOUNTER — Telehealth: Payer: Self-pay | Admitting: Nurse Practitioner

## 2022-07-07 ENCOUNTER — Other Ambulatory Visit: Payer: Self-pay | Admitting: Nurse Practitioner

## 2022-07-07 MED ORDER — SUCRALFATE 1 G PO TABS: 1.0000 g | ORAL_TABLET | Freq: Two times a day (BID) | ORAL | 3 refills | Status: DC

## 2022-07-07 NOTE — Telephone Encounter (Signed)
  Prescription Request  07/07/2022  Is this a "Controlled Substance" medicine? no  Have you seen your PCP in the last 2 weeks? No pt has appt with PCP on 07/22/2022  If YES, route message to pool  -  If NO, patient needs to be scheduled for appointment.  What is the name of the medication or equipment? sucralfate (CARAFATE) 1 g tablet   Have you contacted your pharmacy to request a refill? yes   Which pharmacy would you like this sent to? [facility]   Patient notified that their request is being sent to the clinical staff for review and that they should receive a response within 2 business days.

## 2022-07-07 NOTE — Telephone Encounter (Signed)
Pt needs a 90 day supply

## 2022-07-07 NOTE — Telephone Encounter (Signed)
Medication refill sent to pharmacy for 1 gram 2 times daily different from initial orders of 4x/daily

## 2022-07-08 MED ORDER — SUCRALFATE 1 G PO TABS: 1.0000 g | ORAL_TABLET | Freq: Two times a day (BID) | ORAL | 3 refills | Status: DC

## 2022-07-08 NOTE — Telephone Encounter (Signed)
Patient aware and medication sent to the correct pharmacy.

## 2022-07-22 ENCOUNTER — Encounter: Payer: Medicare Other | Admitting: Nurse Practitioner

## 2022-07-28 ENCOUNTER — Telehealth: Payer: Self-pay | Admitting: Nurse Practitioner

## 2022-07-28 MED ORDER — LISINOPRIL-HYDROCHLOROTHIAZIDE 20-12.5 MG PO TABS: 1.0000 | ORAL_TABLET | Freq: Every day | ORAL | 0 refills | Status: DC

## 2022-07-28 NOTE — Telephone Encounter (Signed)
Pt aware refill sent to [facility]

## 2022-07-28 NOTE — Telephone Encounter (Signed)
  Prescription Request  07/28/2022  Is this a "Controlled Substance" medicine? no  Have you seen your PCP in the last 2 weeks? No. Pt has apt upcoming for 08/28/2022  If YES, route message to pool  -  If NO, patient needs to be scheduled for appointment.  What is the name of the medication or equipment? lisinopril-hydrochlorothiazide (ZESTORETIC) 20-12.5 MG tablet  Have you contacted your pharmacy to request a refill? no   Which pharmacy would you like this sent to? [facility]   Patient notified that their request is being sent to the clinical staff for review and that they should receive a response within 2 business days.

## 2022-08-17 ENCOUNTER — Telehealth: Payer: Self-pay | Admitting: Nurse Practitioner

## 2022-08-17 DIAGNOSIS — E0520 Thyrotoxicosis with toxic multinodular goiter without thyrotoxic crisis or storm: Secondary | ICD-10-CM

## 2022-08-17 MED ORDER — LEVOTHYROXINE SODIUM 75 MCG PO TABS: 75.0000 ug | ORAL_TABLET | Freq: Every day | ORAL | 1 refills | Status: DC

## 2022-08-17 NOTE — Telephone Encounter (Signed)
Done

## 2022-08-17 NOTE — Telephone Encounter (Signed)
Patient is asking for a refill on her thyroid medication - 90 day supply - please send to [facility] so she can get it free. Thank you.

## 2022-08-26 ENCOUNTER — Ambulatory Visit: Payer: Medicare Other | Admitting: Urology

## 2022-08-26 DIAGNOSIS — H524 Presbyopia: Secondary | ICD-10-CM | POA: Diagnosis not present

## 2022-08-26 DIAGNOSIS — H04123 Dry eye syndrome of bilateral lacrimal glands: Secondary | ICD-10-CM | POA: Diagnosis not present

## 2022-08-26 DIAGNOSIS — H52223 Regular astigmatism, bilateral: Secondary | ICD-10-CM | POA: Diagnosis not present

## 2022-08-26 DIAGNOSIS — H401131 Primary open-angle glaucoma, bilateral, mild stage: Secondary | ICD-10-CM | POA: Diagnosis not present

## 2022-08-26 DIAGNOSIS — H43812 Vitreous degeneration, left eye: Secondary | ICD-10-CM | POA: Diagnosis not present

## 2022-08-26 DIAGNOSIS — H2513 Age-related nuclear cataract, bilateral: Secondary | ICD-10-CM | POA: Diagnosis not present

## 2022-08-26 DIAGNOSIS — H5203 Hypermetropia, bilateral: Secondary | ICD-10-CM | POA: Diagnosis not present

## 2022-08-26 LAB — HM DIABETES EYE EXAM

## 2022-08-28 ENCOUNTER — Encounter: Payer: Self-pay | Admitting: Nurse Practitioner

## 2022-08-28 ENCOUNTER — Ambulatory Visit (INDEPENDENT_AMBULATORY_CARE_PROVIDER_SITE_OTHER): Payer: Medicare Other | Admitting: Nurse Practitioner

## 2022-08-28 VITALS — BP 120/66 | HR 76 | Temp 98.7°F | Ht 61.0 in | Wt 148.0 lb

## 2022-08-28 DIAGNOSIS — D367 Benign neoplasm of other specified sites: Secondary | ICD-10-CM | POA: Insufficient documentation

## 2022-08-28 DIAGNOSIS — Z0000 Encounter for general adult medical examination without abnormal findings: Secondary | ICD-10-CM

## 2022-08-28 DIAGNOSIS — R739 Hyperglycemia, unspecified: Secondary | ICD-10-CM | POA: Diagnosis not present

## 2022-08-28 LAB — BAYER DCA HB A1C WAIVED: HB A1C (BAYER DCA - WAIVED): 6.4 % — ABNORMAL HIGH (ref 4.8–5.6)

## 2022-08-28 NOTE — Patient Instructions (Signed)
Health Maintenance After Age 74 After age 74, you are at a higher risk for certain long-term diseases and infections as well as injuries from falls. Falls are a major cause of broken bones and head injuries in people who are older than age 74. Getting regular preventive care can help to keep you healthy and well. Preventive care includes getting regular testing and making lifestyle changes as recommended by your health care provider. Talk with your health care provider about: Which screenings and tests you should have. A screening is a test that checks for a disease when you have no symptoms. A diet and exercise plan that is right for you. What should I know about screenings and tests to prevent falls? Screening and testing are the best ways to find a health problem early. Early diagnosis and treatment give you the best chance of managing medical conditions that are common after age 74. Certain conditions and lifestyle choices may make you more likely to have a fall. Your health care provider may recommend: Regular vision checks. Poor vision and conditions such as cataracts can make you more likely to have a fall. If you wear glasses, make sure to get your prescription updated if your vision changes. Medicine review. Work with your health care provider to regularly review all of the medicines you are taking, including over-the-counter medicines. Ask your health care provider about any side effects that may make you more likely to have a fall. Tell your health care provider if any medicines that you take make you feel dizzy or sleepy. Strength and balance checks. Your health care provider may recommend certain tests to check your strength and balance while standing, walking, or changing positions. Foot health exam. Foot pain and numbness, as well as not wearing proper footwear, can make you more likely to have a fall. Screenings, including: Osteoporosis screening. Osteoporosis is a condition that causes  the bones to get weaker and break more easily. Blood pressure screening. Blood pressure changes and medicines to control blood pressure can make you feel dizzy. Depression screening. You may be more likely to have a fall if you have a fear of falling, feel depressed, or feel unable to do activities that you used to do. Alcohol use screening. Using too much alcohol can affect your balance and may make you more likely to have a fall. Follow these instructions at home: Lifestyle Do not drink alcohol if: Your health care provider tells you not to drink. If you drink alcohol: Limit how much you have to: 0-1 drink a day for women. 0-2 drinks a day for men. Know how much alcohol is in your drink. In the [place] one drink equals one 12 oz bottle of beer (355 mL), one 5 oz glass of wine (148 mL), or one 1 oz glass of hard liquor (44 mL). Do not use any products that contain nicotine or tobacco. These products include cigarettes, chewing tobacco, and vaping devices, such as e-cigarettes. If you need help quitting, ask your health care provider. Activity  Follow a regular exercise program to stay fit. This will help you maintain your balance. Ask your health care provider what types of exercise are appropriate for you. If you need a cane or walker, use it as recommended by your health care provider. Wear supportive shoes that have nonskid soles. Safety  Remove any tripping hazards, such as rugs, cords, and clutter. Install safety equipment such as grab bars in bathrooms and safety rails on stairs. Keep rooms and walkways   well-lit. General instructions Talk with your health care provider about your risks for falling. Tell your health care provider if: You fall. Be sure to tell your health care provider about all falls, even ones that seem minor. You feel dizzy, tiredness (fatigue), or off-balance. Take over-the-counter and prescription medicines only as told by your health care provider. These include  supplements. Eat a healthy diet and maintain a healthy weight. A healthy diet includes low-fat dairy products, low-fat (lean) meats, and fiber from whole grains, beans, and lots of fruits and vegetables. Stay current with your vaccines. Schedule regular health, dental, and eye exams. Summary Having a healthy lifestyle and getting preventive care can help to protect your health and wellness after age 74. Screening and testing are the best way to find a health problem early and help you avoid having a fall. Early diagnosis and treatment give you the best chance for managing medical conditions that are more common for people who are older than age 74. Falls are a major cause of broken bones and head injuries in people who are older than age 74. Take precautions to prevent a fall at home. Work with your health care provider to learn what changes you can make to improve your health and wellness and to prevent falls. This information is not intended to replace advice given to you by your health care provider. Make sure you discuss any questions you have with your health care provider. Document Revised: 02/24/2021 Document Reviewed: 02/24/2021 Elsevier Patient Education  2023 [employer] Inc.  

## 2022-08-28 NOTE — Progress Notes (Addendum)
Established Patient Office Visit  Subjective   Patient ID: Andrea Shannon, female    DOB: Feb 22, 1946  Age: 76 y.o. MRN: [id number]  Chief Complaint  Patient presents with   Annual Exam    HPI  Encounter for general adult medical examination Physical: Patient's last physical exam was 1 year ago .  Weight: Appropriate for height (BMI less than 27%) ;  Blood Pressure: Normal (BP less than 120/80) ;  Medical History: Patient history reviewed ; Family history reviewed ;  Allergies Reviewed: No change in current allergies ;  Medications Reviewed: Medications reviewed - no changes ;  Lipids: abnormal lipid levels: Labs completed, results pending Smoking: Life-long non-smoker ;  Physical Activity: Exercises at least 3 times per week ;  Alcohol/Drug Use: Is a non-drinker ; No illicit drug use ;  Patient is not afflicted from Stress Incontinence and Urge Incontinence  Safety: reviewed ; Patient wears a seat belt, has smoke detectors, has carbon monoxide detectors, practices appropriate gun safety, and wears sunscreen with extended sun exposure. Dental Care: biannual cleanings, brushes and flosses daily. Ophthalmology/Optometry: Annual visit.  Hearing loss: slight bilateral  Vision impairments: none  reading glasses   Patient presents with right armpit cyst present for a few months. Patient denies pain, change in size, bleeding or other complication from cyst. Patient completed Mammogram spring of 2023  with a negative result.   Review of Systems  Constitutional: Negative.  Negative for chills and fever.  HENT: Negative.    Eyes: Negative.   Respiratory: Negative.    Cardiovascular: Negative.   Gastrointestinal: Negative.   Genitourinary: Negative.   Musculoskeletal: Negative.   Skin: Negative.  Negative for itching and rash.       Right under arm cyst  Neurological: Negative.   Psychiatric/Behavioral: Negative.    All other systems reviewed and are negative.     Objective:      BP 120/66   Pulse 76   Temp 98.7 F (37.1 C)   Ht 5' 1" (1.549 m)   Wt 148 lb (67.1 kg)   SpO2 100%   BMI 27.96 kg/m    Physical Exam Vitals and nursing note reviewed.  Constitutional:      Appearance: Normal appearance.  HENT:     Head: Normocephalic.     Right Ear: External ear normal.     Left Ear: External ear normal.     Nose: Nose normal.     Mouth/Throat:     Mouth: Mucous membranes are moist.     Pharynx: Oropharynx is clear.  Eyes:     Conjunctiva/sclera: Conjunctivae normal.     Pupils: Pupils are equal, round, and reactive to light.  Cardiovascular:     Rate and Rhythm: Normal rate and regular rhythm.     Pulses: Normal pulses.     Heart sounds: Normal heart sounds.  Pulmonary:     Effort: Pulmonary effort is normal.     Breath sounds: Normal breath sounds.  Abdominal:     General: Bowel sounds are normal.  Musculoskeletal:        General: Normal range of motion.  Skin:    General: Skin is warm.     Coloration: Skin is not ashen or jaundiced.     Comments: Cycst palpated right under arm  Neurological:     General: No focal deficit present.     Mental Status: She is alert and oriented to person, place, and time.  Psychiatric:  Mood and Affect: Mood normal.        Behavior: Behavior normal.      Results for orders placed or performed in visit on 08/28/22  Bayer DCA Hb A1c Waived  Result Value Ref Range   HB A1C (BAYER DCA - WAIVED) 6.4 (H) 4.8 - 5.6 %      The 10-year ASCVD risk score (Arnett DK, et al., 2019) is: 23%    Assessment & Plan:   Problem List Items Addressed This Visit       Other   Annual physical exam - Primary    Completed annual physical exam and head to toe assessment. Education provided to patient on health maintenance and preventative care. Hand out given to patient. Follow up in 1 year.   Mammograms completed. Dexa completed at [facility], all health maintenance is up to date.  Labs completed today: Aic , cbc,  cmp, lipid panel. Follow up in 1 year.        Relevant Orders   Bayer DCA Hb A1c Waived (Completed)   CMP14+EGFR   Lipid panel   Vitamin B12   Cyst, dermoid, arm, right    Cyst right under arm. No worsening symptoms, will keep an eye on it. Completed bilateral breast exam with not nodules detected, patients MM from few months ago came back negative. Cyst most likly to be sebaceous cyst  or ingrown hair      Other Visit Diagnoses     Healthcare maintenance       Relevant Orders   Microalbumin/Creatinine Ratio, Urine       Return in about 1 year (around 08/29/2023) for annual physical.    Onyeje M Ijaola, NP  

## 2022-08-28 NOTE — Assessment & Plan Note (Signed)
Completed annual physical exam and head to toe assessment. Education provided to patient on health maintenance and preventative care. Hand out given to patient. Follow up in 1 year.   Mammograms completed. Dexa completed at [facility], all health maintenance is up to date.  Labs completed today: Aic , cbc, cmp, lipid panel. Follow up in 1 year.

## 2022-08-28 NOTE — Assessment & Plan Note (Signed)
Cyst right under arm. No worsening symptoms, will keep an eye on it. Completed bilateral breast exam with not nodules detected, patients MM from few months ago came back negative. Cyst most likly to be sebaceous cyst  or ingrown hair

## 2022-08-29 LAB — CMP14+EGFR
ALT: 12 IU/L (ref 0–32)
AST: 15 IU/L (ref 0–40)
Albumin/Globulin Ratio: 1.9 (ref 1.2–2.2)
Albumin: 4.6 g/dL (ref 3.8–4.8)
Alkaline Phosphatase: 75 IU/L (ref 44–121)
BUN/Creatinine Ratio: 11 — ABNORMAL LOW (ref 12–28)
BUN: 12 mg/dL (ref 8–27)
Bilirubin Total: 0.3 mg/dL (ref 0.0–1.2)
CO2: 24 mmol/L (ref 20–29)
Calcium: 9.9 mg/dL (ref 8.7–10.3)
Chloride: 105 mmol/L (ref 96–106)
Creatinine, Ser: 1.06 mg/dL — ABNORMAL HIGH (ref 0.57–1.00)
Globulin, Total: 2.4 g/dL (ref 1.5–4.5)
Glucose: 110 mg/dL — ABNORMAL HIGH (ref 70–99)
Potassium: 4.1 mmol/L (ref 3.5–5.2)
Sodium: 145 mmol/L — ABNORMAL HIGH (ref 134–144)
Total Protein: 7 g/dL (ref 6.0–8.5)
eGFR: 55 mL/min/{1.73_m2} — ABNORMAL LOW (ref >59)

## 2022-08-29 LAB — LIPID PANEL
Chol/HDL Ratio: 3.3 ratio (ref 0.0–4.4)
Cholesterol, Total: 169 mg/dL (ref 100–199)
HDL: 52 mg/dL (ref >39)
LDL Chol Calc (NIH): 94 mg/dL (ref 0–99)
Triglycerides: 129 mg/dL (ref 0–149)
VLDL Cholesterol Cal: 23 mg/dL (ref 5–40)

## 2022-08-29 LAB — VITAMIN B12: Vitamin B-12: 721 pg/mL (ref 232–1245)

## 2022-08-30 ENCOUNTER — Other Ambulatory Visit: Payer: Self-pay | Admitting: Nurse Practitioner

## 2022-08-30 LAB — MICROALBUMIN / CREATININE URINE RATIO
Creatinine, Urine: 201.1 mg/dL
Microalb/Creat Ratio: 4 mg/g creat (ref 0–29)
Microalbumin, Urine: 9 ug/mL

## 2022-09-02 ENCOUNTER — Other Ambulatory Visit (HOSPITAL_COMMUNITY)
Admission: RE | Admit: 2022-09-02 | Discharge: 2022-09-02 | Disposition: A | Discharge status: Home / Self Care | Payer: Medicare Other | Source: Ambulatory Visit | Attending: Nurse Practitioner | Admitting: Nurse Practitioner

## 2022-09-02 DIAGNOSIS — E890 Postprocedural hypothyroidism: Secondary | ICD-10-CM | POA: Diagnosis not present

## 2022-09-02 LAB — TSH: TSH: 0.152 u[IU]/mL — ABNORMAL LOW (ref 0.350–4.500)

## 2022-09-02 LAB — T4, FREE: Free T4: 1.13 ng/dL — ABNORMAL HIGH (ref 0.61–1.12)

## 2022-09-07 ENCOUNTER — Telehealth: Payer: Self-pay | Admitting: Nurse Practitioner

## 2022-09-08 NOTE — Telephone Encounter (Signed)
I am unable to access this information. Patient may have clinic [facility]) send me the result or have the ordering provider send patient the results of her DEXA.    The [facility] hard-copy report (which includes all data, graphical display, and FRAX results when applicable) has been sent directly to the ordering physician.  This report can also be obtained electronically by viewing images for this exam through the performing facility's EMR, or by logging directly into [employer].

## 2022-09-08 NOTE — Progress Notes (Signed)
Patient returning call. Please call back

## 2022-09-09 ENCOUNTER — Other Ambulatory Visit: Payer: Self-pay | Admitting: Nurse Practitioner

## 2022-09-09 NOTE — Telephone Encounter (Signed)
Would be nice if you can show me the link because I can't find it. thanks

## 2022-09-09 NOTE — Telephone Encounter (Signed)
Je it was ordered by [facility] nurse through [facility] from a provider from our office. Click on the blue hyperlink that says scan on 07/20/22 and you can review the DEXA.

## 2022-09-15 ENCOUNTER — Other Ambulatory Visit: Payer: Self-pay | Admitting: Nurse Practitioner

## 2022-09-15 ENCOUNTER — Other Ambulatory Visit: Payer: Self-pay

## 2022-09-15 DIAGNOSIS — E119 Type 2 diabetes mellitus without complications: Secondary | ICD-10-CM

## 2022-09-15 MED ORDER — METFORMIN HCL 500 MG PO TABS: ORAL_TABLET | ORAL | 0 refills | Status: DC

## 2022-09-15 MED ORDER — DICLOFENAC SODIUM 75 MG PO TBEC: 75.0000 mg | DELAYED_RELEASE_TABLET | Freq: Every day | ORAL | 0 refills | Status: DC

## 2022-09-15 NOTE — Telephone Encounter (Signed)
Pt has been notified.

## 2022-09-15 NOTE — Telephone Encounter (Signed)
  Prescription Request  09/15/2022  Is this a "Controlled Substance" medicine? no  Have you seen your PCP in the last 2 weeks?   If YES, route message to pool  -  If NO, patient needs to be scheduled for appointment.  What is the name of the medication or equipment? metFORMIN (GLUCOPHAGE) 500 MG tablet and diclofenac (VOLTAREN) 75 MG EC tablet   Have you contacted your pharmacy to request a refill? yes   Which pharmacy would you like this sent to?  [facility] [place], [place] - [address]      *wants 90 day supply with 2 refills     Patient notified that their request is being sent to the clinical staff for review and that they should receive a response within 2 business days.

## 2022-09-15 NOTE — Telephone Encounter (Signed)
Patient calling to check on DEXA results. Please call back

## 2022-09-15 NOTE — Telephone Encounter (Signed)
Pt aware refills sent to pharmacy 

## 2022-09-15 NOTE — Telephone Encounter (Signed)
DEXA Normal for patient. She does not meet criteria for FRAX

## 2022-09-17 NOTE — Patient Instructions (Addendum)
-   The correct intake of thyroid hormone (Levothyroxine, Synthroid), is on empty stomach first thing in the morning, with water, separated by at least 30 minutes from breakfast and other medications,  and separated by more than 4 hours from calcium, iron, multivitamins, acid reflux medications (PPIs).  - This medication is a life-long medication and will be needed to correct thyroid hormone imbalances for the rest of your life.  The dose may change from time to time, based on thyroid blood work.  - It is extremely important to be consistent taking this medication, near the same time each morning.  -AVOID TAKING PRODUCTS CONTAINING BIOTIN (commonly found in Hair, Skin, Nails vitamins) AS IT INTERFERES WITH THE VALIDITY OF THYROID FUNCTION BLOOD TESTS.     SKIP YOUR THYROID PILL ON SATURDAYS!!!!

## 2022-09-18 ENCOUNTER — Encounter: Payer: Self-pay | Admitting: Nurse Practitioner

## 2022-09-18 ENCOUNTER — Ambulatory Visit (INDEPENDENT_AMBULATORY_CARE_PROVIDER_SITE_OTHER): Payer: Medicare Other | Admitting: Nurse Practitioner

## 2022-09-18 VITALS — BP 132/70 | HR 76 | Ht 61.0 in | Wt 152.0 lb

## 2022-09-18 DIAGNOSIS — E890 Postprocedural hypothyroidism: Secondary | ICD-10-CM | POA: Diagnosis not present

## 2022-09-18 DIAGNOSIS — E559 Vitamin D deficiency, unspecified: Secondary | ICD-10-CM

## 2022-09-18 DIAGNOSIS — E0520 Thyrotoxicosis with toxic multinodular goiter without thyrotoxic crisis or storm: Secondary | ICD-10-CM | POA: Diagnosis not present

## 2022-09-18 NOTE — Progress Notes (Signed)
09/18/2022, 10:58 AM     Endocrinology follow-up note    Subjective:    Patient ID: Andrea Shannon, female    DOB: 1946-01-29, PCP Ivy Lynn, NP   Past Medical History:  Diagnosis Date   Acid reflux    Arthritis    Diabetes mellitus, type II ([place])    Hypertension    Hyperthyroidism    Hypothyroidism    Past Surgical History:  Procedure Laterality Date   ABDOMINAL HYSTERECTOMY     Social History   Socioeconomic History   Marital status: Widowed    Spouse name: Not on file   Number of children: 1   Years of education: 12   Highest education level: High school graduate  Occupational History   Not on file  Tobacco Use   Smoking status: Never   Smokeless tobacco: Never  Vaping Use   Vaping Use: Never used  Substance and Sexual Activity   Alcohol use: Not Currently   Drug use: Never   Sexual activity: Not Currently    Birth control/protection: Surgical  Other Topics Concern   Not on file  Social History Narrative   Not on file   Social Determinants of Health   Financial Resource Strain: Low Risk  (12/31/2021)   Overall Financial Resource Strain (CARDIA)    Difficulty of Paying Living Expenses: Not hard at all  Food Insecurity: No Food Insecurity (12/31/2021)   Hunger Vital Sign    Worried About Running Out of Food in the Last Year: Never true    [place] in the Last Year: Never true  Transportation Needs: No Transportation Needs (12/31/2021)   PRAPARE - Hydrologist (Medical): No    Lack of Transportation (Non-Medical): No  Physical Activity: Insufficiently Active (12/31/2021)   Exercise Vital Sign    Days of Exercise per Week: 2 days    Minutes of Exercise per Session: 60 min  Stress: No Stress Concern Present (12/31/2021)   [place]    Feeling of Stress : Not at all  Social Connections:  Moderately Integrated (12/31/2021)   Social Connection and Isolation Panel [NHANES]    Frequency of Communication with Friends and Family: More than three times a week    Frequency of Social Gatherings with Friends and Family: More than three times a week    Attends Religious Services: More than 4 times per year    Active Member of [employer] or Organizations: Yes    Attends Archivist Meetings: More than 4 times per year    Marital Status: Widowed   Outpatient Encounter Medications as of 09/18/2022  Medication Sig   aspirin 81 MG chewable tablet Chew by mouth daily.   diclofenac (VOLTAREN) 75 MG EC tablet Take 1 tablet (75 mg total) by mouth daily.   latanoprost (XALATAN) 0.005 % ophthalmic solution Apply to eye.   levothyroxine (SYNTHROID) 75 MCG tablet Take 1 tablet (75 mcg total) by mouth daily before breakfast.   lisinopril-hydrochlorothiazide (ZESTORETIC) 20-12.5 MG tablet Take 1 tablet by mouth daily.   metFORMIN (GLUCOPHAGE) 500 MG tablet TAKE 1 TABLET BY MOUTH TWICE A DAY WITH A MEAL  pantoprazole (PROTONIX) 40 MG tablet Take 1 tablet (40 mg total) by mouth daily.   sucralfate (CARAFATE) 1 g tablet Take 1 tablet (1 g total) by mouth 2 (two) times daily.   cephALEXin (KEFLEX) 500 MG capsule Take 1 capsule (500 mg total) by mouth 2 (two) times daily. (Patient not taking: Reported on 09/18/2022)   gabapentin (NEURONTIN) 300 MG capsule Take 1 capsule (300 mg total) by mouth 3 (three) times daily. (Patient not taking: Reported on 09/18/2022)   hydrOXYzine (ATARAX) 10 MG tablet Take 1 tablet (10 mg total) by mouth 3 (three) times daily as needed. (Patient not taking: Reported on 09/18/2022)   Iron, Ferrous Sulfate, 325 (65 Fe) MG TABS Take 325 mg by mouth daily. (Patient not taking: Reported on 09/18/2022)   mirabegron ER (MYRBETRIQ) 50 MG TB24 tablet Take 1 tablet (50 mg total) by mouth daily. (Patient not taking: Reported on 09/18/2022)   No facility-administered encounter medications on  file as of 09/18/2022.   ALLERGIES: No Known Allergies  VACCINATION STATUS:  There is no immunization history on file for this patient.  Thyroid Problem Presents for follow-up (-She was treated with I-131 on September 29, 2019 for toxic multinodular goiter.   She reports family history of thyroid dysfunction, family history of thyroid malignancy.) visit. Symptoms include heat intolerance. Patient reports no anxiety, cold intolerance, constipation, depressed mood, diarrhea, fatigue, leg swelling, palpitations, tremors, weight gain or weight loss. The symptoms have been stable.     Andrea Shannon is 76 y.o. female who presents today with a medical history as above.  Review of systems  Constitutional: + stable body weight  current Body mass index is 28.72 kg/m. , no fatigue, + subjective hyperthermia-intermittent, no subjective hypothermia Eyes: no blurry vision, no xerophthalmia ENT: no sore throat, no nodules palpated in throat, no dysphagia/odynophagia, no hoarseness Cardiovascular: no chest pain, no shortness of breath, + intermittent palpitations, no leg swelling Respiratory: no cough, no shortness of breath Gastrointestinal: no nausea/vomiting/diarrhea Musculoskeletal: no muscle/joint aches Skin: no rashes, no hyperemia Neurological: no tremors, no numbness, no tingling, no dizziness Psychiatric: no depression, no anxiety  Objective:    BP 132/70 (BP Location: Right Arm, Patient Position: Sitting, Cuff Size: Normal)   Pulse 76   Ht _0  (1.549 m)   Wt 152 lb (68.9 kg)   BMI 28.72 kg/m   Wt Readings from Last 3 Encounters:  09/18/22 152 lb (68.9 kg)  08/28/22 148 lb (67.1 kg)  04/22/22 147 lb 3.2 oz (66.8 kg)     BP Readings from Last 3 Encounters:  09/18/22 132/70  08/28/22 120/66  04/22/22 109/70    Physical Exam- Limited  Constitutional:  Body mass index is 28.72 kg/m. , not in acute distress, normal state of mind Eyes:  EOMI, no  exophthalmos Musculoskeletal: no gross deformities, strength intact in all four extremities, no gross restriction of joint movements Skin:  no rashes, no hyperemia Neurological: no tremor with outstretched hands   Recent Results (from the past 2160 hour(s))  Bayer DCA Hb A1c Waived     Status: Abnormal   Collection Time: 08/28/22  8:52 AM  Result Value Ref Range   HB A1C (BAYER DCA - WAIVED) 6.4 (H) 4.8 - 5.6 %    Comment:          Prediabetes: 5.7 - 6.4          Diabetes: >6.4          Glycemic control for adults with diabetes: <7.0  Microalbumin/Creatinine Ratio, Urine     Status: None   Collection Time: 08/28/22  8:56 AM  Result Value Ref Range   Creatinine, Urine 201.1 Not Estab. mg/dL   Microalbumin, Urine 9.0 Not Estab. ug/mL   Microalb/Creat Ratio 4 0 - 29 mg/g creat    Comment:                        Normal:                0 -  29                        Moderately increased: 30 - 300                        Severely increased:       >300   CMP14+EGFR     Status: Abnormal   Collection Time: 08/28/22  9:00 AM  Result Value Ref Range   Glucose 110 (H) 70 - 99 mg/dL   BUN 12 8 - 27 mg/dL   Creatinine, Ser 1.06 (H) 0.57 - 1.00 mg/dL   eGFR 55 (L) >59 mL/min/1.73   BUN/Creatinine Ratio 11 (L) 12 - 28   Sodium 145 (H) 134 - 144 mmol/L   Potassium 4.1 3.5 - 5.2 mmol/L   Chloride 105 96 - 106 mmol/L   CO2 24 20 - 29 mmol/L   Calcium 9.9 8.7 - 10.3 mg/dL   Total Protein 7.0 6.0 - 8.5 g/dL   Albumin 4.6 3.8 - 4.8 g/dL   Globulin, Total 2.4 1.5 - 4.5 g/dL   Albumin/Globulin Ratio 1.9 1.2 - 2.2   Bilirubin Total 0.3 0.0 - 1.2 mg/dL   Alkaline Phosphatase 75 44 - 121 IU/L   AST 15 0 - 40 IU/L   ALT 12 0 - 32 IU/L  Lipid panel     Status: None   Collection Time: 08/28/22  9:00 AM  Result Value Ref Range   Cholesterol, Total 169 100 - 199 mg/dL   Triglycerides 129 0 - 149 mg/dL   HDL 52 >39 mg/dL   VLDL Cholesterol Cal 23 5 - 40 mg/dL   LDL Chol Calc (NIH) 94 0 - 99 mg/dL    Chol/HDL Ratio 3.3 0.0 - 4.4 ratio    Comment:                                   T. Chol/HDL Ratio                                             Men  Women                               1/2 Avg.Risk  3.4    3.3                                   Avg.Risk  5.0    4.4  2X Avg.Risk  9.6    7.1                                3X Avg.Risk 23.4   11.0   Vitamin B12     Status: None   Collection Time: 08/28/22  9:00 AM  Result Value Ref Range   Vitamin B-12 721 232 - 1,245 pg/mL  TSH     Status: Abnormal   Collection Time: 09/02/22 12:15 PM  Result Value Ref Range   TSH 0.152 (L) 0.350 - 4.500 uIU/mL    Comment: Performed by a 3rd Generation assay with a functional sensitivity of <=0.01 uIU/mL. Performed at [facility], [address], [place], [place] [address]   T4, free     Status: Abnormal   Collection Time: 09/02/22 12:15 PM  Result Value Ref Range   Free T4 1.13 (H) 0.61 - 1.12 ng/dL    Comment: (NOTE) Biotin ingestion may interfere with free T4 tests. If the results are inconsistent with the TSH level, previous test results, or the clinical presentation, then consider biotin interference. If needed, order repeat testing after stopping biotin. Performed at [facility], [place] [address] [place], [place] [address]    Ultrasound of the thyroid on June 27, 2018 showed right lobe measuring 4.9 x 1.9 x 2.1 cm with 1 dominant mixed cystic and solid nodule measuring 2.2 x 1.5 x 1.8 cm.  Another nodule in the superior right lobe measuring 1.9 x 0.9 x 1.2 cm. Left lobe heterogeneous measuring 4.7 x 3.2 x 2.3 cm with dominant mixed cystic and solid nodule in the central left lobe measuring 2.6 x 2.4 x 2.3 cm.  Separate mixed cystic and solid nodule in the superior left lobe measuring 1.7 x 1.3 x 1.4 cm. Impression:  innumerable thyroid nodules, increasing probability of benignity.  2 suspicious right thyroid nodule measuring 1.9 x 0.9 x 1.2 cm in the  superior right lobe.  Ultrasound-guided FNA is suggested.  More suspicious left thyroid nodule in the superior left lobe measuring 2.6 x 2.4 x 2.3 cm.  Ultrasound-guided FNA suggested.   Fine-needle aspiration on November 30, 2018 revealed atypia of undetermined significance,  afirma testing-reported benign, with malignancy risk less than 4%.     Thyroid uptake and scan on September 04, 2019 showed enlarged thyroid gland with several focal areas of increased uptake, likely representing nodules.  No areas of photopenia.  24-hour uptake 35%.   Results for JILLAYNE, WITTE (MRN [id number]) as of 08/12/2020 10:53  Ref. Range 10/19/2018 00:00 04/11/2019 00:00 08/14/2019 00:00 11/20/2019 00:00 01/22/2020 00:00  TSH Latest Ref Range: 0.41 - 5.90  0.48 1.26 0.01 (A) 0.01 (A) 0.04 (A)   US Thyroid 11/15/2018 Narrative & Impression  CLINICAL DATA:  Multinodular goiter   EXAM: THYROID ULTRASOUND   TECHNIQUE: Ultrasound examination of the thyroid gland and adjacent soft tissues was performed.   COMPARISON:  06/27/2018 by report only from [facility]   FINDINGS: Parenchymal Echotexture: Moderately heterogenous   Isthmus: 0.3 cm thickness, stable   Right lobe: 5.6 x 2.3 x 2 cm, previously 4.9 x 1.9 x 2.1   Left lobe: 6.1 x 3.3 x 2.3 cm, previously 4.7 x 3.2 x 2.3   _________________________________________________________   Estimated total number of nodules >/= 1 cm: 6-10   Number of spongiform nodules >/=  2 cm not described below (TR1): 0   Number of mixed  cystic and solid nodules >/= 1.5 cm not described below ([place]): 0   _________________________________________________________   Nodule # 1:   Location: Right; Superior   Maximum size: 1.4 cm; Other 2 dimensions: 1.1 x 1 cm   Composition: mixed cystic and solid (1)   Echogenicity: hypoechoic (2)   Shape: not taller-than-wide (0)   Margins: ill-defined (0)   Echogenic foci: none (0)   ACR TI-RADS total points: 3.    ACR TI-RADS risk category: TR3 (3 points).   ACR TI-RADS recommendations:   Given size (<1.4 cm) and appearance, this nodule does NOT meet TI-RADS criteria for biopsy or dedicated follow-up.   _________________________________________________________   Nodule # 2: 2.7 x 1.9 x 2 cm spongiform nodule, mid right; This nodule does NOT meet TI-RADS criteria for biopsy or dedicated follow-up.   Nodule # 3: 1.5 x 0.9 x 1.2 cm spongiform nodule, inferior right; This nodule does NOT meet TI-RADS criteria for biopsy or dedicated follow-up.   Nodule # 4:   Location: Left; Superior   Maximum size: 2 cm; Other 2 dimensions: 1.7 x 1.7 cm   Composition: mixed cystic and solid (1)   Echogenicity: hypoechoic (2)   Shape: not taller-than-wide (0)   Margins: smooth (0)   Echogenic foci: none (0)   ACR TI-RADS total points: 3.   ACR TI-RADS risk category: TR3 (3 points).   ACR TI-RADS recommendations:   *Given size (>/= 1.5 - 2.4 cm) and appearance, a follow-up ultrasound in 1 year should be considered based on TI-RADS criteria.   _________________________________________________________   Nodule # 5:   Location: Left; Mid   Maximum size: 3 cm; Other 2 dimensions: 2.8 x 1.9 cm   Composition: mixed cystic and solid (1)   Echogenicity: isoechoic (1)   Shape: not taller-than-wide (0)   Margins: smooth (0)   Echogenic foci: none (0)   ACR TI-RADS total points: 2.   ACR TI-RADS risk category: TR2 (2 points).   ACR TI-RADS recommendations:   This nodule does NOT meet TI-RADS criteria for biopsy or dedicated follow-up.   _________________________________________________________   Nodule # 6:   Location: Left; Inferior   Maximum size: 3.2 cm; Other 2 dimensions: 2.7 x 1.9 cm   Composition: mixed cystic and solid (1)   Echogenicity: isoechoic (1)   Shape: not taller-than-wide (0)   Margins: smooth (0)   Echogenic foci: none (0)   ACR TI-RADS total points:  2.   ACR TI-RADS risk category: TR2 (2 points).   ACR TI-RADS recommendations:   This nodule does NOT meet TI-RADS criteria for biopsy or dedicated follow-up.   _________________________________________________________   Nodule # 7:   Location: Left; Inferior posterior   Maximum size: 3 cm; Other 2 dimensions: 2.9 x 2.3 cm   Composition: solid/almost completely solid (2)   Echogenicity: isoechoic (1)   Shape: taller-than-wide (3)   Margins: ill-defined (0)   Echogenic foci: none (0)   ACR TI-RADS total points: 6.   ACR TI-RADS risk category: TR4 (4-6 points).   ACR TI-RADS recommendations:   **Given size (>/= 1.5 cm) and appearance, fine needle aspiration of this moderately suspicious nodule should be considered based on TI-RADS criteria.   IMPRESSION: 1. Thyromegaly with bilateral nodules. 2. Recommend FNA biopsy of moderately suspicious 3 cm inferior posterior nodule #7; This was not described on the prior study. 3. Recommend annual/biennial ultrasound follow-up of additional lesion as above, until stability x5 years confirmed.   -------------------------------------------------------------------------------------------------------------------------------  FOLLOW UP THYROID US  FROM 12/02/20 CLINICAL DATA:  Hypothyroidism. Left inferior thyroid nodule biopsy on 11/30/2018.   EXAM: THYROID ULTRASOUND   TECHNIQUE: Ultrasound examination of the thyroid gland and adjacent soft tissues was performed.   COMPARISON:  11/15/2018   FINDINGS: Parenchymal Echotexture: Moderately heterogenous   Isthmus: 0.2 cm, previously 0.3 cm   Right lobe: 4.9 x 1.7 x 1.6 cm, previously 5.6 x 2.3 x 2.0 cm   Left lobe: 5.1 x 2.2 x 1.7 cm, previously 6.1 x 2.8 x 2.3 cm   _________________________________________________________   Estimated total number of nodules >/= 1 cm: 5   Number of spongiform nodules >/=  2 cm not described below (TR1): 0   Number of mixed cystic  and solid nodules >/= 1.5 cm not described below ([place]): 0   _________________________________________________________   Again noted are multiple right thyroid nodules. The right thyroid nodules are predominantly cystic or spongiform.   Nodule 2 in the right mid thyroid lobe has markedly decreased in size. This is a spongiform or mixed cystic and solid nodule that measures 1.3 x 0.9 x 0.9 cm and previously measured 2.7 x 1.9 x 2.0 cm.   Nodule 3 in the right inferior thyroid is mixed cystic and solid nodule that measures roughly 1.5 x 1.0 x 1.2 cm and previously measured 1.3 x 1.2 x 0.9 cm.   Multiple left thyroid nodules that are predominantly mixed cystic and solid nodules.   Nodule 5 in the left mid thyroid lobe measures 1.3 x 0.5 x 0.5 cm and previously measured 2.8 x 1.9 x 3.0 cm. This is a mixed cystic and solid nodule and the solid portion is isoechoic.   Nodule 6 is a mixed cystic and solid nodule in the left inferior thyroid lobe measures 1.6 x 0.9 x 1.5 cm and previously measured 2.7 x 1.9 x 3.2 cm.   Nodule 7 in the left inferior thyroid lobe represents the previously biopsied nodule. This nodule is slightly hypoechoic and has scattered cystic areas. This nodule measures 2.1 x 2.0 x 1.5 cm and previously measured 3.0 x 2.9 x 2.3 cm.   IMPRESSION: 1. Multinodular goiter. 2. Most of the nodules are mixed cystic and solid composition. Many of the nodules have decreased in size as described. 3. No new suspicious thyroid nodules.   The above is in keeping with the ACR TI-RADS recommendations - J Am Coll Radiol 2017;14:587-595.     Electronically Signed   By: Markus Daft M.D.   On: 12/02/2020 14:20   Latest Reference Range & Units 01/22/20 00:00 08/05/20 00:00 12/06/20 00:00 09/10/21 00:00 04/22/22 11:20 09/02/22 12:15  TSH 0.350 - 4.500 uIU/mL 0.04 ! (E) 1.58 (E) 0.03 ! (E) 0.18 ! (E) 0.329 (L) 0.152 (L)  T4,Free(Direct) 0.61 - 1.12 ng/dL      1.13 (H)  !: Data  is abnormal (L): Data is abnormally low (H): Data is abnormally high (E): External lab result  Assessment & Plan:   1. RAI - induced hypothyroidism -She status post I-131 thyroid ablation on September 29, 2019.    -Her previsit TFTs are consistent with slight over-replacement (her TSH is always mildly suppressed and now Free T4 is slightly elevated).  She is advised to continue Levothyroxine 75 mcg po daily before breakfast but to skip 1 day out of the week.   - We discussed about the correct intake of her thyroid hormone, on empty stomach at fasting, with water, separated by at least 30 minutes from breakfast and other medications,  and separated by more than 4 hours from calcium, iron, multivitamins, acid reflux medications (PPIs). -Patient is made aware of the fact that thyroid hormone replacement is needed for life, dose to be adjusted by periodic monitoring of thyroid function tests.  2.  Multinodular goiter -Her fine-needle aspiration was significant for atypia of undetermined significance, a sample was sent for afirma testing-with grossly benign finding with malignancy is less than 4%.  She will not need surgical intervention at this time.    -Her thyroid US from 10/2018 shows multiple nodules which recommended follow up with [place] in 1 year. Her repeat thyroid US from 11/2020 shows that all nodules have decreased in size and there are no new nodules.  No additional surveillance recommended at this time.    - I advised her  to maintain close follow up with Ivy Lynn, NP for primary care needs.    I spent 30 minutes in the care of the patient today including review of labs from Thyroid Function, CMP, and other relevant labs ; imaging/biopsy records (current and previous including abstractions from other facilities); face-to-face time discussing  her lab results and symptoms, medications doses, her options of short and long term treatment based on the latest standards of care /  guidelines;   and documenting the encounter.  Eli Phillips  participated in the discussions, expressed understanding, and voiced agreement with the above plans.  All questions were answered to her satisfaction. she is encouraged to contact clinic should she have any questions or concerns prior to her return visit.  Follow up plan: Return in about 1 year (around 09/19/2023) for Thyroid follow up, Previsit labs.   Rayetta Pigg, [facility] [address] [place], [place] [address] Phone: [phone] Fax: [phone]   09/18/2022, 10:58 AM

## 2022-09-22 ENCOUNTER — Telehealth: Payer: Self-pay | Admitting: Nurse Practitioner

## 2022-09-22 DIAGNOSIS — E119 Type 2 diabetes mellitus without complications: Secondary | ICD-10-CM

## 2022-09-22 MED ORDER — METFORMIN HCL 500 MG PO TABS: ORAL_TABLET | ORAL | 0 refills | Status: DC

## 2022-09-22 NOTE — Telephone Encounter (Signed)
Rx sent- patient aware.  

## 2022-09-28 ENCOUNTER — Other Ambulatory Visit: Payer: Self-pay | Admitting: Nurse Practitioner

## 2022-09-28 DIAGNOSIS — K219 Gastro-esophageal reflux disease without esophagitis: Secondary | ICD-10-CM

## 2022-09-28 MED ORDER — PANTOPRAZOLE SODIUM 40 MG PO TBEC: 40.0000 mg | DELAYED_RELEASE_TABLET | Freq: Every day | ORAL | 1 refills | Status: DC

## 2022-09-28 NOTE — Telephone Encounter (Signed)
  Prescription Request  09/28/2022  Is this a "Controlled Substance" medicine? pantoprazole (PROTONIX) 40 MG tablet   Have you seen your PCP in the last 2 weeks? 08/28/2022  If YES, route message to pool  -  If NO, patient needs to be scheduled for appointment.  What is the name of the medication or equipment? pantoprazole (PROTONIX) 40 MG tablet   Have you contacted your pharmacy to request a refill? no   Which pharmacy would you like this sent to? [facility] 90 day supply with 2 refills   Patient notified that their request is being sent to the clinical staff for review and that they should receive a response within 2 business days.

## 2022-09-29 ENCOUNTER — Encounter: Payer: Self-pay | Admitting: Family Medicine

## 2022-09-29 ENCOUNTER — Other Ambulatory Visit: Payer: Medicare Other

## 2022-09-29 ENCOUNTER — Ambulatory Visit (INDEPENDENT_AMBULATORY_CARE_PROVIDER_SITE_OTHER): Payer: Medicare Other | Admitting: Family Medicine

## 2022-09-29 VITALS — BP 120/67 | HR 82 | Temp 97.5°F | Ht 61.0 in | Wt 149.2 lb

## 2022-09-29 DIAGNOSIS — R002 Palpitations: Secondary | ICD-10-CM

## 2022-09-29 NOTE — Progress Notes (Signed)
Subjective:  Patient ID: Andrea Shannon, female    DOB: [birth year]  Age: 76 y.o. MRN: [id number]  CC: Palpitations   HPI Andrea Shannon presents for feels a rush that comes and goes. Onset a few days after her P.E. on 11/10. Felt fluttering. Thyroid Dr. Rockey Situ her to cut back to  days a week n her thyroid med. Lasts from a few seconds  Has a different funny feeling now in the low sternal area.      09/29/2022    3:02 PM 08/28/2022    8:27 AM 04/22/2022   10:43 AM  Depression screen PHQ 2/9  Decreased Interest 0 0 2  Down, Depressed, Hopeless 0 0 3  PHQ - 2 Score 0 0 5  Altered sleeping  0 3  Tired, decreased energy  0 3  Change in appetite  0 3  Feeling bad or failure about yourself   0 0  Trouble concentrating  0 3  Moving slowly or fidgety/restless  0 0  Suicidal thoughts  0 0  PHQ-9 Score  0 17  Difficult doing work/chores  Not difficult at all Not difficult at all    History Andrea Shannon has a past medical history of Acid reflux, Arthritis, Diabetes mellitus, type II ([place]), Hypertension, Hyperthyroidism, and Hypothyroidism.   She has a past surgical history that includes Abdominal hysterectomy.   Her family history includes Arthritis in her sister; Asthma in her father and sister; Diabetes in her mother, sister, and son; Heart disease in her sister.She reports that she has never smoked. She has never used smokeless tobacco. She reports that she does not currently use alcohol. She reports that she does not use drugs.    ROS Review of Systems  Constitutional: Negative.   HENT: Negative.    Eyes:  Negative for visual disturbance.  Respiratory:  Negative for shortness of breath.   Cardiovascular:  Negative for chest pain.  Gastrointestinal:  Negative for abdominal pain.  Musculoskeletal:  Negative for arthralgias.    Objective:  BP 120/67   Pulse 82   Temp (!) 97.5 F (36.4 C)   Ht '5\' 1"'$  (1.549 m)   Wt 149 lb 3.2 oz (67.7 kg)   SpO2 99%   BMI 28.19 kg/m   BP  Readings from Last 3 Encounters:  09/29/22 120/67  09/18/22 132/70  08/28/22 120/66    Wt Readings from Last 3 Encounters:  09/29/22 149 lb 3.2 oz (67.7 kg)  09/18/22 152 lb (68.9 kg)  08/28/22 148 lb (67.1 kg)     Physical Exam Constitutional:      General: She is not in acute distress.    Appearance: She is well-developed.  Cardiovascular:     Rate and Rhythm: Normal rate and regular rhythm.  Pulmonary:     Breath sounds: Normal breath sounds.  Musculoskeletal:        General: Normal range of motion.  Skin:    General: Skin is warm and dry.  Neurological:     Mental Status: She is alert and oriented to person, place, and time.       Assessment & Plan:   Andrea Shannon was seen today for palpitations.  Diagnoses and all orders for this visit:  Palpitations -     EKG 12-Lead -     LONG TERM MONITOR (3-14 DAYS); Future       I have discontinued Kashvi Hilyer's cephALEXin. I am also having her maintain her aspirin, latanoprost, mirabegron ER, Iron (Ferrous Sulfate), hydrOXYzine, gabapentin,  sucralfate, lisinopril-hydrochlorothiazide, levothyroxine, diclofenac, metFORMIN, and pantoprazole.  Allergies as of 09/29/2022   No Known Allergies      Medication List        Accurate as of September 29, 2022 10:38 PM. If you have any questions, ask your nurse or doctor.          STOP taking these medications    cephALEXin 500 MG capsule Commonly known as: Keflex Stopped by: Claretta Fraise, MD       TAKE these medications    aspirin 81 MG chewable tablet Chew by mouth daily.   diclofenac 75 MG EC tablet Commonly known as: VOLTAREN Take 1 tablet (75 mg total) by mouth daily.   gabapentin 300 MG capsule Commonly known as: NEURONTIN Take 1 capsule (300 mg total) by mouth 3 (three) times daily.   hydrOXYzine 10 MG tablet Commonly known as: ATARAX Take 1 tablet (10 mg total) by mouth 3 (three) times daily as needed.   Iron (Ferrous Sulfate) 325 (65 Fe)  MG Tabs Take 325 mg by mouth daily.   latanoprost 0.005 % ophthalmic solution Commonly known as: XALATAN Apply to eye.   levothyroxine 75 MCG tablet Commonly known as: SYNTHROID Take 1 tablet (75 mcg total) by mouth daily before breakfast.   lisinopril-hydrochlorothiazide 20-12.5 MG tablet Commonly known as: ZESTORETIC Take 1 tablet by mouth daily.   metFORMIN 500 MG tablet Commonly known as: GLUCOPHAGE TAKE 1 TABLET BY MOUTH TWICE A DAY WITH A MEAL   mirabegron ER 50 MG Tb24 tablet Commonly known as: MYRBETRIQ Take 1 tablet (50 mg total) by mouth daily.   pantoprazole 40 MG tablet Commonly known as: PROTONIX Take 1 tablet (40 mg total) by mouth daily.   sucralfate 1 g tablet Commonly known as: CARAFATE Take 1 tablet (1 g total) by mouth 2 (two) times daily.         Follow-up: Return if symptoms worsen or fail to improve.  Claretta Fraise, M.D.

## 2022-10-17 DIAGNOSIS — R002 Palpitations: Secondary | ICD-10-CM | POA: Diagnosis not present

## 2022-12-02 ENCOUNTER — Ambulatory Visit: Payer: Medicare Other | Admitting: Nurse Practitioner

## 2022-12-02 ENCOUNTER — Ambulatory Visit (INDEPENDENT_AMBULATORY_CARE_PROVIDER_SITE_OTHER): Payer: Medicare Other | Admitting: Family Medicine

## 2022-12-02 ENCOUNTER — Encounter: Payer: Self-pay | Admitting: Family Medicine

## 2022-12-02 VITALS — BP 144/62 | HR 73 | Temp 97.8°F | Ht 61.0 in | Wt 148.2 lb

## 2022-12-02 DIAGNOSIS — G8929 Other chronic pain: Secondary | ICD-10-CM

## 2022-12-02 DIAGNOSIS — Z1231 Encounter for screening mammogram for malignant neoplasm of breast: Secondary | ICD-10-CM | POA: Diagnosis not present

## 2022-12-02 DIAGNOSIS — E039 Hypothyroidism, unspecified: Secondary | ICD-10-CM | POA: Diagnosis not present

## 2022-12-02 DIAGNOSIS — I152 Hypertension secondary to endocrine disorders: Secondary | ICD-10-CM | POA: Diagnosis not present

## 2022-12-02 DIAGNOSIS — F419 Anxiety disorder, unspecified: Secondary | ICD-10-CM

## 2022-12-02 DIAGNOSIS — L720 Epidermal cyst: Secondary | ICD-10-CM

## 2022-12-02 DIAGNOSIS — E119 Type 2 diabetes mellitus without complications: Secondary | ICD-10-CM | POA: Diagnosis not present

## 2022-12-02 DIAGNOSIS — E1159 Type 2 diabetes mellitus with other circulatory complications: Secondary | ICD-10-CM

## 2022-12-02 DIAGNOSIS — M2550 Pain in unspecified joint: Secondary | ICD-10-CM | POA: Diagnosis not present

## 2022-12-02 DIAGNOSIS — E118 Type 2 diabetes mellitus with unspecified complications: Secondary | ICD-10-CM

## 2022-12-02 LAB — BAYER DCA HB A1C WAIVED: HB A1C (BAYER DCA - WAIVED): 6.6 % — ABNORMAL HIGH (ref 4.8–5.6)

## 2022-12-02 LAB — CBC WITH DIFFERENTIAL/PLATELET

## 2022-12-02 MED ORDER — DICLOFENAC SODIUM 75 MG PO TBEC: 75.0000 mg | DELAYED_RELEASE_TABLET | Freq: Every day | ORAL | 2 refills | Status: DC

## 2022-12-02 MED ORDER — LISINOPRIL-HYDROCHLOROTHIAZIDE 20-12.5 MG PO TABS: 1.0000 | ORAL_TABLET | Freq: Every day | ORAL | 1 refills | Status: DC

## 2022-12-02 NOTE — Progress Notes (Signed)
Subjective:  Patient ID: Andrea Shannon, female    DOB: July 23, 1946, 77 y.o.   MRN: [id number]  Patient Care Team: Baruch Gouty, FNP as PCP - General (Family Medicine)   Chief Complaint:  Establish Care ([facility] patient ) and Medical Management of Chronic Issues   HPI: Andrea Shannon is a 77 y.o. female presenting on 12/02/2022 for Establish Care ([facility] patient ) and Medical Management of Chronic Issues   1. Diabetes mellitus type 2 with complications ([place]) Has been taking metformin and is tolerating well. Denies polyuria, polyphagia, or polydipsia. She does follow a healthy diet and is active daily.   2. Hypertension associated with diabetes ([place]) Compliant with medications and tolerating well. Denies headaches, visual changes, leg swelling, weakness, confusion, or fatigue.   3. Acquired hypothyroidism Is followed by endocrinology on a yearly basis. Does report heat and cold intolerance recently. No other hyper- or hypothyroid symptoms.   4. Anxiety Not on medications and feels she is managing well.     12/02/2022    9:43 AM 08/28/2022    8:27 AM 04/22/2022   10:44 AM 01/19/2022    9:18 AM  GAD 7 : Generalized Anxiety Score  Nervous, Anxious, on Edge 0 0 1 1  Control/stop worrying 0 0 0 0  Worry too much - different things 0 0 0 0  Trouble relaxing 0 0 0 0  Restless 0 0 0 0  Easily annoyed or irritable 0 0 0 0  Afraid - awful might happen 0 0 0 0  Total GAD 7 Score 0 0 1 1  Anxiety Difficulty Not difficult at all Not difficult at all Not difficult at all Not difficult at all       12/02/2022    9:43 AM 09/29/2022    3:02 PM 08/28/2022    8:27 AM 04/22/2022   10:43 AM 01/19/2022    9:18 AM  Depression screen PHQ 2/9  Decreased Interest 0 0 0 2 1  Down, Depressed, Hopeless 0 0 0 3 0  PHQ - 2 Score 0 0 0 5 1  Altered sleeping 0  0 3 1  Tired, decreased energy 0  0 3 1  Change in appetite 0  0 3 1  Feeling bad or failure about yourself  0  0 0 0  Trouble concentrating 0  0  3 1  Moving slowly or fidgety/restless 0  0 0 0  Suicidal thoughts 0  0 0 0  PHQ-9 Score 0  0 17 5  Difficult doing work/chores Not difficult at all  Not difficult at all Not difficult at all Not difficult at all         Relevant past medical, surgical, family, and social history reviewed and updated as indicated.  Allergies and medications reviewed and updated. Data reviewed: Chart in Epic.   Past Medical History:  Diagnosis Date   Acid reflux    Arthritis    Diabetes mellitus, type II ([place])    Hypertension    Hyperthyroidism    Hypothyroidism     Past Surgical History:  Procedure Laterality Date   ABDOMINAL HYSTERECTOMY      Social History   Socioeconomic History   Marital status: Widowed    Spouse name: Not on file   Number of children: 1   Years of education: 12   Highest education level: High school graduate  Occupational History   Not on file  Tobacco Use   Smoking status: Never  Smokeless tobacco: Never  Vaping Use   Vaping Use: Never used  Substance and Sexual Activity   Alcohol use: Not Currently   Drug use: Never   Sexual activity: Not Currently    Birth control/protection: Surgical  Other Topics Concern   Not on file  Social History Narrative   Not on file   Social Determinants of Health   Financial Resource Strain: Low Risk  (12/31/2021)   Overall Financial Resource Strain (CARDIA)    Difficulty of Paying Living Expenses: Not hard at all  Food Insecurity: No Food Insecurity (12/31/2021)   Hunger Vital Sign    Worried About Running Out of Food in the Last Year: Never true    [place] in the Last Year: Never true  Transportation Needs: No Transportation Needs (12/31/2021)   PRAPARE - Hydrologist (Medical): No    Lack of Transportation (Non-Medical): No  Physical Activity: Insufficiently Active (12/31/2021)   Exercise Vital Sign    Days of Exercise per Week: 2 days    Minutes of Exercise per Session: 60  min  Stress: No Stress Concern Present (12/31/2021)   [place]    Feeling of Stress : Not at all  Social Connections: Moderately Integrated (12/31/2021)   Social Connection and Isolation Panel [NHANES]    Frequency of Communication with Friends and Family: More than three times a week    Frequency of Social Gatherings with Friends and Family: More than three times a week    Attends Religious Services: More than 4 times per year    Active Member of [employer] or Organizations: Yes    Attends Archivist Meetings: More than 4 times per year    Marital Status: Widowed  Intimate Partner Violence: Not At Risk (12/31/2021)   Humiliation, Afraid, Rape, and Kick questionnaire    Fear of Current or Ex-Partner: No    Emotionally Abused: No    Physically Abused: No    Sexually Abused: No    Outpatient Encounter Medications as of 12/02/2022  Medication Sig   aspirin 81 MG chewable tablet Chew by mouth daily.   levothyroxine (SYNTHROID) 75 MCG tablet Take 1 tablet (75 mcg total) by mouth daily before breakfast.   metFORMIN (GLUCOPHAGE) 500 MG tablet TAKE 1 TABLET BY MOUTH TWICE A DAY WITH A MEAL   pantoprazole (PROTONIX) 40 MG tablet Take 1 tablet (40 mg total) by mouth daily.   sucralfate (CARAFATE) 1 g tablet Take 1 tablet (1 g total) by mouth 2 (two) times daily.   [DISCONTINUED] diclofenac (VOLTAREN) 75 MG EC tablet Take 1 tablet (75 mg total) by mouth daily.   [DISCONTINUED] lisinopril-hydrochlorothiazide (ZESTORETIC) 20-12.5 MG tablet Take 1 tablet by mouth daily.   diclofenac (VOLTAREN) 75 MG EC tablet Take 1 tablet (75 mg total) by mouth daily.   lisinopril-hydrochlorothiazide (ZESTORETIC) 20-12.5 MG tablet Take 1 tablet by mouth daily.   [DISCONTINUED] diclofenac (VOLTAREN) 75 MG EC tablet Take 1 tablet (75 mg total) by mouth daily.   [DISCONTINUED] gabapentin (NEURONTIN) 300 MG capsule Take 1 capsule (300 mg total) by  mouth 3 (three) times daily. (Patient not taking: Reported on 09/18/2022)   [DISCONTINUED] hydrOXYzine (ATARAX) 10 MG tablet Take 1 tablet (10 mg total) by mouth 3 (three) times daily as needed. (Patient not taking: Reported on 09/18/2022)   [DISCONTINUED] Iron, Ferrous Sulfate, 325 (65 Fe) MG TABS Take 325 mg by mouth daily. (Patient not  taking: Reported on 09/18/2022)   [DISCONTINUED] latanoprost (XALATAN) 0.005 % ophthalmic solution Apply to eye.   [DISCONTINUED] lisinopril-hydrochlorothiazide (ZESTORETIC) 20-12.5 MG tablet Take 1 tablet by mouth daily.   [DISCONTINUED] mirabegron ER (MYRBETRIQ) 50 MG TB24 tablet Take 1 tablet (50 mg total) by mouth daily. (Patient not taking: Reported on 09/18/2022)   No facility-administered encounter medications on file as of 12/02/2022.    No Known Allergies  Review of Systems  Constitutional:  Negative for activity change, appetite change, chills, diaphoresis, fatigue, fever and unexpected weight change.  HENT: Negative.    Eyes: Negative.  Negative for photophobia and visual disturbance.  Respiratory:  Negative for cough, chest tightness and shortness of breath.   Cardiovascular:  Negative for chest pain, palpitations and leg swelling.  Gastrointestinal:  Negative for abdominal pain, blood in stool, constipation, diarrhea, nausea and vomiting.  Endocrine: Positive for cold intolerance and heat intolerance. Negative for polydipsia, polyphagia and polyuria.  Genitourinary:  Negative for decreased urine volume, difficulty urinating, dysuria, frequency and urgency.  Musculoskeletal:  Negative for arthralgias and myalgias.  Skin:        Lesion to axilla and vaginal area  Allergic/Immunologic: Negative.   Neurological:  Negative for dizziness, tremors, seizures, syncope, facial asymmetry, speech difficulty, weakness, light-headedness, numbness and headaches.  Hematological: Negative.   Psychiatric/Behavioral:  Negative for confusion, hallucinations, sleep  disturbance and suicidal ideas.   All other systems reviewed and are negative.       Objective:  BP (!) 144/62   Pulse 73   Temp 97.8 F (36.6 C) (Temporal)   Ht 5' 1"$  (1.549 m)   Wt 148 lb 3.2 oz (67.2 kg)   SpO2 100%   BMI 28.00 kg/m    Wt Readings from Last 3 Encounters:  12/02/22 148 lb 3.2 oz (67.2 kg)  09/29/22 149 lb 3.2 oz (67.7 kg)  09/18/22 152 lb (68.9 kg)    Physical Exam Vitals and nursing note reviewed.  Constitutional:      General: She is not in acute distress.    Appearance: Normal appearance. She is well-developed and well-groomed. She is not ill-appearing, toxic-appearing or diaphoretic.  HENT:     Head: Normocephalic and atraumatic.     Jaw: There is normal jaw occlusion.     Right Ear: Hearing normal.     Left Ear: Hearing normal.     Nose: Nose normal.     Mouth/Throat:     Lips: Pink.     Mouth: Mucous membranes are moist.     Pharynx: Oropharynx is clear. Uvula midline.  Eyes:     General: Lids are normal.     Extraocular Movements: Extraocular movements intact.     Conjunctiva/sclera: Conjunctivae normal.     Pupils: Pupils are equal, round, and reactive to light.  Neck:     Thyroid: No thyroid mass, thyromegaly or thyroid tenderness.     Vascular: No carotid bruit or JVD.     Trachea: Trachea and phonation normal.  Cardiovascular:     Rate and Rhythm: Normal rate and regular rhythm.     Chest Wall: PMI is not displaced.     Pulses: Normal pulses.     Heart sounds: Normal heart sounds. No murmur heard.    No friction rub. No gallop.  Pulmonary:     Effort: Pulmonary effort is normal. No respiratory distress.     Breath sounds: Normal breath sounds. No wheezing.  Abdominal:     General: Bowel sounds are normal.  There is no distension or abdominal bruit.     Palpations: Abdomen is soft. There is no hepatomegaly or splenomegaly.     Tenderness: There is no abdominal tenderness. There is no right CVA tenderness or left CVA tenderness.      Hernia: No hernia is present.  Musculoskeletal:        General: Normal range of motion.     Cervical back: Normal range of motion and neck supple.     Right lower leg: No edema.     Left lower leg: No edema.  Lymphadenopathy:     Cervical: No cervical adenopathy.  Skin:    General: Skin is warm and dry.     Capillary Refill: Capillary refill takes less than 2 seconds.     Coloration: Skin is not cyanotic, jaundiced or pale.     Findings: Lesion present. No erythema or rash.       Neurological:     General: No focal deficit present.     Mental Status: She is alert and oriented to person, place, and time.     Sensory: Sensation is intact.     Motor: Motor function is intact.     Coordination: Coordination is intact.     Gait: Gait is intact.     Deep Tendon Reflexes: Reflexes are normal and symmetric.  Psychiatric:        Attention and Perception: Attention and perception normal.        Mood and Affect: Mood and affect normal.        Speech: Speech normal.        Behavior: Behavior normal. Behavior is cooperative.        Thought Content: Thought content normal.        Cognition and Memory: Cognition and memory normal.        Judgment: Judgment normal.    Results for orders placed or performed during the hospital encounter of 09/02/22  TSH  Result Value Ref Range   TSH 0.152 (L) 0.350 - 4.500 uIU/mL  T4, free  Result Value Ref Range   Free T4 1.13 (H) 0.61 - 1.12 ng/dL       Pertinent labs & imaging results that were available during my care of the patient were reviewed by me and considered in my medical decision making.  Assessment & Plan:  Aisosa was seen today for establish care and medical management of chronic issues.  Diagnoses and all orders for this visit:  Diabetes mellitus type 2 with complications ([place]) 123XX123 6.6 today. Well controlled. Continue current regimen. Does not have a podiatrist, will refer today.  -     Bayer DCA Hb A1c Waived -     Ambulatory  referral to Podiatry  Hypertension associated with diabetes ([place]) BP well controlled. Changes were not made in regimen today. Goal BP is 130/80. Pt aware to report any persistent high or low readings. DASH diet and exercise encouraged. Exercise at least 150 minutes per week and increase as tolerated. Goal BMI > 25. Stress management encouraged. Avoid nicotine and tobacco product use. Avoid excessive alcohol and NSAID's. Avoid more than 2000 mg of sodium daily. Medications as prescribed. Follow up as scheduled.  -     CBC with Differential/Platelet -     CMP14+EGFR -     lisinopril-hydrochlorothiazide (ZESTORETIC) 20-12.5 MG tablet; Take 1 tablet by mouth daily.  Acquired hypothyroidism Thyroid disease has been well controlled. Labs are pending. Adjustments to regimen will be made if warranted.  Make sure to take medications on an empty stomach with a full glass of water. Make sure to avoid vitamins or supplements for at least 4 hours before and 4 hours after taking medications. Repeat labs in 3 months if adjustments are made and in 6 months if stable.   -     Thyroid Panel With TSH  Chronic pain of multiple joints Does well on below. Will continue.  -     diclofenac (VOLTAREN) 75 MG EC tablet; Take 1 tablet (75 mg total) by mouth daily.  Anxiety No new or worsening symptoms.   Epidermoid cyst Will schedule an appointment to have removed.    Encounter for screening mammogram for malignant neoplasm of breast -     MM 3D SCREEN BREAST BILATERAL     Continue all other maintenance medications.  Follow up plan: Return for  1-2 weeks 30 min for cyst removal schedule AWV after march, DM 3 months.   Continue healthy lifestyle choices, including diet (rich in fruits, vegetables, and lean proteins, and low in salt and simple carbohydrates) and exercise (at least 30 minutes of moderate physical activity daily).  Educational handout given for DM  The above assessment and management plan was  discussed with the patient. The patient verbalized understanding of and has agreed to the management plan. Patient is aware to call the clinic if they develop any new symptoms or if symptoms persist or worsen. Patient is aware when to return to the clinic for a follow-up visit. Patient educated on when it is appropriate to go to the emergency department.   Monia Pouch, FNP-C [facility] [phone]

## 2022-12-02 NOTE — Patient Instructions (Signed)

## 2022-12-03 ENCOUNTER — Other Ambulatory Visit: Payer: Self-pay

## 2022-12-03 DIAGNOSIS — D649 Anemia, unspecified: Secondary | ICD-10-CM

## 2022-12-03 DIAGNOSIS — R7989 Other specified abnormal findings of blood chemistry: Secondary | ICD-10-CM

## 2022-12-03 LAB — CBC WITH DIFFERENTIAL/PLATELET
Basophils Absolute: 0 10*3/uL (ref 0.0–0.2)
Basos: 1 %
EOS (ABSOLUTE): 0.1 10*3/uL (ref 0.0–0.4)
Eos: 2 %
Hematocrit: 33.3 % — ABNORMAL LOW (ref 34.0–46.6)
Hemoglobin: 10.3 g/dL — ABNORMAL LOW (ref 11.1–15.9)
Immature Grans (Abs): 0 10*3/uL (ref 0.0–0.1)
Immature Granulocytes: 0 %
Lymphocytes Absolute: 1.9 10*3/uL (ref 0.7–3.1)
Lymphs: 32 %
MCH: 24.3 pg — ABNORMAL LOW (ref 26.6–33.0)
MCHC: 30.9 g/dL — ABNORMAL LOW (ref 31.5–35.7)
MCV: 79 fL (ref 79–97)
Monocytes Absolute: 0.4 10*3/uL (ref 0.1–0.9)
Monocytes: 6 %
Neutrophils Absolute: 3.5 10*3/uL (ref 1.4–7.0)
Neutrophils: 59 %
Platelets: 469 10*3/uL — ABNORMAL HIGH (ref 150–450)
RBC: 4.24 x10E6/uL (ref 3.77–5.28)
RDW: 17.5 % — ABNORMAL HIGH (ref 11.7–15.4)
WBC: 5.9 10*3/uL (ref 3.4–10.8)

## 2022-12-03 LAB — CMP14+EGFR
ALT: 14 IU/L (ref 0–32)
AST: 14 IU/L (ref 0–40)
Albumin/Globulin Ratio: 2.3 — ABNORMAL HIGH (ref 1.2–2.2)
Albumin: 4.4 g/dL (ref 3.8–4.8)
Alkaline Phosphatase: 67 IU/L (ref 44–121)
BUN/Creatinine Ratio: 14 (ref 12–28)
BUN: 15 mg/dL (ref 8–27)
Bilirubin Total: <0.2 mg/dL (ref 0.0–1.2)
CO2: 24 mmol/L (ref 20–29)
Calcium: 10.2 mg/dL (ref 8.7–10.3)
Chloride: 103 mmol/L (ref 96–106)
Creatinine, Ser: 1.04 mg/dL — ABNORMAL HIGH (ref 0.57–1.00)
Globulin, Total: 1.9 g/dL (ref 1.5–4.5)
Glucose: 86 mg/dL (ref 70–99)
Potassium: 3.9 mmol/L (ref 3.5–5.2)
Sodium: 143 mmol/L (ref 134–144)
Total Protein: 6.3 g/dL (ref 6.0–8.5)
eGFR: 56 mL/min/{1.73_m2} — ABNORMAL LOW (ref >59)

## 2022-12-03 LAB — THYROID PANEL WITH TSH
Free Thyroxine Index: 2.4 (ref 1.2–4.9)
T3 Uptake Ratio: 29 % (ref 24–39)
T4, Total: 8.4 ug/dL (ref 4.5–12.0)
TSH: 0.542 u[IU]/mL (ref 0.450–4.500)

## 2022-12-10 ENCOUNTER — Ambulatory Visit (INDEPENDENT_AMBULATORY_CARE_PROVIDER_SITE_OTHER): Payer: Medicare Other | Admitting: Family Medicine

## 2022-12-10 ENCOUNTER — Encounter: Payer: Self-pay | Admitting: Family Medicine

## 2022-12-10 VITALS — BP 132/73 | HR 82 | Temp 98.3°F | Ht 61.0 in | Wt 149.2 lb

## 2022-12-10 DIAGNOSIS — L723 Sebaceous cyst: Secondary | ICD-10-CM

## 2022-12-10 DIAGNOSIS — L720 Epidermal cyst: Secondary | ICD-10-CM

## 2022-12-10 MED ORDER — SULFAMETHOXAZOLE-TRIMETHOPRIM 800-160 MG PO TABS: 1.0000 | ORAL_TABLET | Freq: Two times a day (BID) | ORAL | 0 refills | Status: AC

## 2022-12-10 NOTE — Progress Notes (Signed)
Subjective:  Patient ID: Andrea Shannon, female    DOB: 06-20-1946, 77 y.o.   MRN: [id number]  Patient Care Team: Baruch Gouty, FNP as PCP - General (Family Medicine)   Chief Complaint:  Cyst   HPI: Andrea Shannon is a 77 y.o. female presenting on 12/10/2022 for Cyst   Pt presents today for two inflamed epidermoid cyst removals. She has one to her right axilla and one to her vulva.         Relevant past medical, surgical, family, and social history reviewed and updated as indicated.  Allergies and medications reviewed and updated. Data reviewed: Chart in Epic.   Past Medical History:  Diagnosis Date   Acid reflux    Arthritis    Diabetes mellitus, type II ([place])    Hypertension    Hyperthyroidism    Hypothyroidism     Past Surgical History:  Procedure Laterality Date   ABDOMINAL HYSTERECTOMY      Social History   Socioeconomic History   Marital status: Widowed    Spouse name: Not on file   Number of children: 1   Years of education: 12   Highest education level: High school graduate  Occupational History   Not on file  Tobacco Use   Smoking status: Never   Smokeless tobacco: Never  Vaping Use   Vaping Use: Never used  Substance and Sexual Activity   Alcohol use: Not Currently   Drug use: Never   Sexual activity: Not Currently    Birth control/protection: Surgical  Other Topics Concern   Not on file  Social History Narrative   Not on file   Social Determinants of Health   Financial Resource Strain: Low Risk  (12/31/2021)   Overall Financial Resource Strain (CARDIA)    Difficulty of Paying Living Expenses: Not hard at all  Food Insecurity: No Food Insecurity (12/31/2021)   Hunger Vital Sign    Worried About Running Out of Food in the Last Year: Never true    [place] in the Last Year: Never true  Transportation Needs: No Transportation Needs (12/31/2021)   PRAPARE - Hydrologist (Medical): No    Lack of  Transportation (Non-Medical): No  Physical Activity: Insufficiently Active (12/31/2021)   Exercise Vital Sign    Days of Exercise per Week: 2 days    Minutes of Exercise per Session: 60 min  Stress: No Stress Concern Present (12/31/2021)   [place]    Feeling of Stress : Not at all  Social Connections: Moderately Integrated (12/31/2021)   Social Connection and Isolation Panel [NHANES]    Frequency of Communication with Friends and Family: More than three times a week    Frequency of Social Gatherings with Friends and Family: More than three times a week    Attends Religious Services: More than 4 times per year    Active Member of [employer] or Organizations: Yes    Attends Archivist Meetings: More than 4 times per year    Marital Status: Widowed  Intimate Partner Violence: Not At Risk (12/31/2021)   Humiliation, Afraid, Rape, and Kick questionnaire    Fear of Current or Ex-Partner: No    Emotionally Abused: No    Physically Abused: No    Sexually Abused: No    Outpatient Encounter Medications as of 12/10/2022  Medication Sig   aspirin 81 MG chewable tablet Chew by mouth daily.  diclofenac (VOLTAREN) 75 MG EC tablet Take 1 tablet (75 mg total) by mouth daily.   levothyroxine (SYNTHROID) 75 MCG tablet Take 1 tablet (75 mcg total) by mouth daily before breakfast.   lisinopril-hydrochlorothiazide (ZESTORETIC) 20-12.5 MG tablet Take 1 tablet by mouth daily.   metFORMIN (GLUCOPHAGE) 500 MG tablet TAKE 1 TABLET BY MOUTH TWICE A DAY WITH A MEAL   pantoprazole (PROTONIX) 40 MG tablet Take 1 tablet (40 mg total) by mouth daily.   sucralfate (CARAFATE) 1 g tablet Take 1 tablet (1 g total) by mouth 2 (two) times daily.   sulfamethoxazole-trimethoprim (BACTRIM DS) 800-160 MG tablet Take 1 tablet by mouth 2 (two) times daily for 7 days.   No facility-administered encounter medications on file as of 12/10/2022.    No Known  Allergies  Review of Systems  Constitutional:  Negative for activity change, appetite change, chills, diaphoresis, fatigue, fever and unexpected weight change.  HENT: Negative.    Eyes: Negative.   Respiratory:  Negative for cough, chest tightness and shortness of breath.   Cardiovascular:  Negative for chest pain, palpitations and leg swelling.  Gastrointestinal:  Negative for abdominal pain, blood in stool, constipation, diarrhea, nausea and vomiting.  Endocrine: Negative.   Genitourinary:  Negative for decreased urine volume, difficulty urinating, dysuria, frequency and urgency.  Musculoskeletal:  Negative for arthralgias and myalgias.  Skin:  Positive for color change and rash. Negative for pallor and wound.  Allergic/Immunologic: Negative.   Neurological:  Negative for dizziness, weakness and headaches.  Hematological: Negative.   Psychiatric/Behavioral:  Negative for confusion, hallucinations, sleep disturbance and suicidal ideas.   All other systems reviewed and are negative.       Objective:  BP 132/73   Pulse 82   Temp 98.3 F (36.8 C) (Temporal)   Ht 5' 1"$  (1.549 m)   Wt 149 lb 3.2 oz (67.7 kg)   SpO2 98%   BMI 28.19 kg/m    Wt Readings from Last 3 Encounters:  12/10/22 149 lb 3.2 oz (67.7 kg)  12/02/22 148 lb 3.2 oz (67.2 kg)  09/29/22 149 lb 3.2 oz (67.7 kg)    Physical Exam Vitals and nursing note reviewed.  Constitutional:      General: She is not in acute distress.    Appearance: Normal appearance. She is normal weight. She is not ill-appearing, toxic-appearing or diaphoretic.  HENT:     Head: Normocephalic and atraumatic.     Mouth/Throat:     Mouth: Mucous membranes are moist.  Eyes:     Pupils: Pupils are equal, round, and reactive to light.  Cardiovascular:     Rate and Rhythm: Normal rate and regular rhythm.     Heart sounds: Normal heart sounds.  Pulmonary:     Effort: Pulmonary effort is normal.     Breath sounds: Normal breath sounds.   Musculoskeletal:     Right lower leg: No edema.     Left lower leg: No edema.  Skin:    General: Skin is warm and dry.     Capillary Refill: Capillary refill takes less than 2 seconds.       Neurological:     General: No focal deficit present.     Mental Status: She is alert and oriented to person, place, and time.     Cranial Nerves: No cranial nerve deficit.     Sensory: No sensory deficit.     Motor: No weakness.     Coordination: Coordination normal.  Gait: Gait normal.     Deep Tendon Reflexes: Reflexes normal.  Psychiatric:        Mood and Affect: Mood normal.        Behavior: Behavior normal.        Thought Content: Thought content normal.        Judgment: Judgment normal.     Results for orders placed or performed in visit on 12/02/22  CBC with Differential/Platelet  Result Value Ref Range   WBC 5.9 3.4 - 10.8 x10E3/uL   RBC 4.24 3.77 - 5.28 x10E6/uL   Hemoglobin 10.3 (L) 11.1 - 15.9 g/dL   Hematocrit 33.3 (L) 34.0 - 46.6 %   MCV 79 79 - 97 fL   MCH 24.3 (L) 26.6 - 33.0 pg   MCHC 30.9 (L) 31.5 - 35.7 g/dL   RDW 17.5 (H) 11.7 - 15.4 %   Platelets 469 (H) 150 - 450 x10E3/uL   Neutrophils 59 Not Estab. %   Lymphs 32 Not Estab. %   Monocytes 6 Not Estab. %   Eos 2 Not Estab. %   Basos 1 Not Estab. %   Neutrophils Absolute 3.5 1.4 - 7.0 x10E3/uL   Lymphocytes Absolute 1.9 0.7 - 3.1 x10E3/uL   Monocytes Absolute 0.4 0.1 - 0.9 x10E3/uL   EOS (ABSOLUTE) 0.1 0.0 - 0.4 x10E3/uL   Basophils Absolute 0.0 0.0 - 0.2 x10E3/uL   Immature Granulocytes 0 Not Estab. %   Immature Grans (Abs) 0.0 0.0 - 0.1 x10E3/uL  CMP14+EGFR  Result Value Ref Range   Glucose 86 70 - 99 mg/dL   BUN 15 8 - 27 mg/dL   Creatinine, Ser 1.04 (H) 0.57 - 1.00 mg/dL   eGFR 56 (L) >59 mL/min/1.73   BUN/Creatinine Ratio 14 12 - 28   Sodium 143 134 - 144 mmol/L   Potassium 3.9 3.5 - 5.2 mmol/L   Chloride 103 96 - 106 mmol/L   CO2 24 20 - 29 mmol/L   Calcium 10.2 8.7 - 10.3 mg/dL   Total  Protein 6.3 6.0 - 8.5 g/dL   Albumin 4.4 3.8 - 4.8 g/dL   Globulin, Total 1.9 1.5 - 4.5 g/dL   Albumin/Globulin Ratio 2.3 (H) 1.2 - 2.2   Bilirubin Total <0.2 0.0 - 1.2 mg/dL   Alkaline Phosphatase 67 44 - 121 IU/L   AST 14 0 - 40 IU/L   ALT 14 0 - 32 IU/L  Thyroid Panel With TSH  Result Value Ref Range   TSH 0.542 0.450 - 4.500 uIU/mL   T4, Total 8.4 4.5 - 12.0 ug/dL   T3 Uptake Ratio 29 24 - 39 %   Free Thyroxine Index 2.4 1.2 - 4.9  Bayer DCA Hb A1c Waived  Result Value Ref Range   HB A1C (BAYER DCA - WAIVED) 6.6 (H) 4.8 - 5.6 %     I & D  Date/Time: 12/10/2022 1:29 PM  Performed by: Baruch Gouty, FNP Authorized by: Baruch Gouty, FNP   Consent:    Consent obtained:  Written   Consent given by:  Patient   Risks, benefits, and alternatives were discussed: yes     Risks discussed:  Bleeding, incomplete drainage, pain, damage to other organs and infection   Alternatives discussed:  No treatment, delayed treatment, alternative treatment, observation and referral Universal protocol:    Procedure explained and questions answered to patient or proxy's satisfaction: yes     Relevant documents present and verified: yes     Immediately prior  to procedure a time out was called: yes     Patient identity confirmed:  Verbally with patient Location:    Type:  Cyst   Size:  3.5 cm   Location:  Upper extremity   Upper extremity location: right axilla. Pre-procedure details:    Skin preparation:  Alcohol and povidone-iodine Sedation:    Sedation type:  None Anesthesia:    Anesthesia method:  Local infiltration   Local anesthetic:  Lidocaine 2% WITH epi Procedure type:    Complexity:  Complex Procedure details:    Needle aspiration: no     Incision types:  Elliptical   Scalpel blade:  11   Wound management:  Extensive cleaning, debrided, probed and deloculated and irrigated with saline   Drainage amount:  Moderate   Wound treatment: 1 suture placed loosely.   Packing  materials:  None Post-procedure details:    Procedure completion:  Tolerated well, no immediate complications I & D  Date/Time: 12/10/2022 1:32 PM  Performed by: Baruch Gouty, FNP Authorized by: Baruch Gouty, FNP   Consent:    Consent obtained:  Written   Consent given by:  Patient   Risks, benefits, and alternatives were discussed: yes     Risks discussed:  Bleeding, pain, damage to other organs, infection and incomplete drainage   Alternatives discussed:  No treatment, delayed treatment, observation, referral and alternative treatment Universal protocol:    Procedure explained and questions answered to patient or proxy's satisfaction: yes     Relevant documents present and verified: yes     Immediately prior to procedure a time out was called: yes     Patient identity confirmed:  Verbally with patient Location:    Type:  Cyst   Location:  Anogenital   Anogenital location:  [place] Pre-procedure details:    Skin preparation:  Alcohol and povidone-iodine Sedation:    Sedation type:  None Anesthesia:    Anesthesia method:  Local infiltration   Local anesthetic:  Lidocaine 2% WITH epi Procedure type:    Complexity:  Complex Procedure details:    Needle aspiration: no     Incision types:  Single straight   Scalpel blade:  11   Wound management:  Probed and deloculated, irrigated with saline, extensive cleaning and debrided   Drainage:  Purulent   Drainage amount:  Moderate   Wound treatment:  Wound left open   Packing materials:  None Post-procedure details:    Procedure completion:  Tolerated well, no immediate complications    Pertinent labs & imaging results that were available during my care of the patient were reviewed by me and considered in my medical decision making.  Assessment & Plan:  Andrea Shannon was seen today for cyst.  Diagnoses and all orders for this visit:  Inflamed epidermoid cyst of skin Tolerated procedures well. Due to slight erythema, will treat  with below. Follow up in 7-10 days for suture removal and wound recheck.  -     sulfamethoxazole-trimethoprim (BACTRIM DS) 800-160 MG tablet; Take 1 tablet by mouth 2 (two) times daily for 7 days. -     I & D -     I & D     Continue all other maintenance medications.  Follow up plan: Return in about 10 days (around 12/20/2022), or if symptoms worsen or fail to improve, for suture removal.   Continue healthy lifestyle choices, including diet (rich in fruits, vegetables, and lean proteins, and low in salt and simple carbohydrates) and exercise (  at least 30 minutes of moderate physical activity daily).  Educational handout given for cyst removal.   The above assessment and management plan was discussed with the patient. The patient verbalized understanding of and has agreed to the management plan. Patient is aware to call the clinic if they develop any new symptoms or if symptoms persist or worsen. Patient is aware when to return to the clinic for a follow-up visit. Patient educated on when it is appropriate to go to the emergency department.   Monia Pouch, FNP-C [facility] [phone]

## 2022-12-18 ENCOUNTER — Encounter: Payer: Self-pay | Admitting: Family Medicine

## 2022-12-18 ENCOUNTER — Ambulatory Visit (INDEPENDENT_AMBULATORY_CARE_PROVIDER_SITE_OTHER): Payer: Medicare Other | Admitting: Family Medicine

## 2022-12-18 VITALS — BP 135/74 | HR 80 | Temp 97.4°F | Ht 61.0 in | Wt 147.6 lb

## 2022-12-18 DIAGNOSIS — Z4889 Encounter for other specified surgical aftercare: Secondary | ICD-10-CM

## 2022-12-18 DIAGNOSIS — K219 Gastro-esophageal reflux disease without esophagitis: Secondary | ICD-10-CM

## 2022-12-18 DIAGNOSIS — Z5189 Encounter for other specified aftercare: Secondary | ICD-10-CM

## 2022-12-18 DIAGNOSIS — G8929 Other chronic pain: Secondary | ICD-10-CM

## 2022-12-18 MED ORDER — DICLOFENAC SODIUM 75 MG PO TBEC: 75.0000 mg | DELAYED_RELEASE_TABLET | Freq: Two times a day (BID) | ORAL | 2 refills | Status: DC

## 2022-12-18 MED ORDER — PANTOPRAZOLE SODIUM 40 MG PO TBEC: 40.0000 mg | DELAYED_RELEASE_TABLET | Freq: Every day | ORAL | 3 refills | Status: DC

## 2022-12-18 NOTE — Addendum Note (Signed)
Addended by: Antonietta Barcelona D on: 12/18/2022 04:45 PM   Modules accepted: Orders

## 2022-12-18 NOTE — Progress Notes (Signed)
Diclofenac RF failed. resent

## 2022-12-18 NOTE — Progress Notes (Signed)
Subjective:  Patient ID: Andrea Shannon, female    DOB: Nov 18, 1945, 77 y.o.   MRN: [id number]  Patient Care Team: Baruch Gouty, FNP as PCP - General (Family Medicine)   Chief Complaint:  Suture / Staple Removal and Wound Check   HPI: Andrea Shannon is a 77 y.o. female presenting on 12/18/2022 for Suture / Staple Removal and Wound Check   Suture / Staple Removal The sutures were placed 7 to 10 days ago. The treatment provided significant relief. There has been no drainage from the wound. There is no redness present. There is no swelling present. There is no pain present. She has no difficulty moving the affected extremity or digit.  Wound Check She was originally treated 5 to 10 days ago. Previous treatment included I&D of abscess and oral antibiotics. There has been no drainage from the wound. There is no redness present. There is no swelling present. There is no pain present. She has no difficulty moving the affected extremity or digit.    Relevant past medical, surgical, family, and social history reviewed and updated as indicated.  Allergies and medications reviewed and updated. Data reviewed: Chart in Epic.   Past Medical History:  Diagnosis Date   Acid reflux    Arthritis    Diabetes mellitus, type II ([place])    Hypertension    Hyperthyroidism    Hypothyroidism     Past Surgical History:  Procedure Laterality Date   ABDOMINAL HYSTERECTOMY      Social History   Socioeconomic History   Marital status: Widowed    Spouse name: Not on file   Number of children: 1   Years of education: 12   Highest education level: High school graduate  Occupational History   Not on file  Tobacco Use   Smoking status: Never   Smokeless tobacco: Never  Vaping Use   Vaping Use: Never used  Substance and Sexual Activity   Alcohol use: Not Currently   Drug use: Never   Sexual activity: Not Currently    Birth control/protection: Surgical  Other Topics Concern   Not on file   Social History Narrative   Not on file   Social Determinants of Health   Financial Resource Strain: Low Risk  (12/31/2021)   Overall Financial Resource Strain (CARDIA)    Difficulty of Paying Living Expenses: Not hard at all  Food Insecurity: No Food Insecurity (12/31/2021)   Hunger Vital Sign    Worried About Running Out of Food in the Last Year: Never true    [place] in the Last Year: Never true  Transportation Needs: No Transportation Needs (12/31/2021)   PRAPARE - Hydrologist (Medical): No    Lack of Transportation (Non-Medical): No  Physical Activity: Insufficiently Active (12/31/2021)   Exercise Vital Sign    Days of Exercise per Week: 2 days    Minutes of Exercise per Session: 60 min  Stress: No Stress Concern Present (12/31/2021)   [place]    Feeling of Stress : Not at all  Social Connections: Moderately Integrated (12/31/2021)   Social Connection and Isolation Panel [NHANES]    Frequency of Communication with Friends and Family: More than three times a week    Frequency of Social Gatherings with Friends and Family: More than three times a week    Attends Religious Services: More than 4 times per year    Active Member  of Clubs or Organizations: Yes    Attends Archivist Meetings: More than 4 times per year    Marital Status: Widowed  Intimate Partner Violence: Not At Risk (12/31/2021)   Humiliation, Afraid, Rape, and Kick questionnaire    Fear of Current or Ex-Partner: No    Emotionally Abused: No    Physically Abused: No    Sexually Abused: No    Outpatient Encounter Medications as of 12/18/2022  Medication Sig   aspirin 81 MG chewable tablet Chew by mouth daily.   levothyroxine (SYNTHROID) 75 MCG tablet Take 1 tablet (75 mcg total) by mouth daily before breakfast.   lisinopril-hydrochlorothiazide (ZESTORETIC) 20-12.5 MG tablet Take 1 tablet by mouth daily.    metFORMIN (GLUCOPHAGE) 500 MG tablet TAKE 1 TABLET BY MOUTH TWICE A DAY WITH A MEAL   sucralfate (CARAFATE) 1 g tablet Take 1 tablet (1 g total) by mouth 2 (two) times daily.   [DISCONTINUED] diclofenac (VOLTAREN) 75 MG EC tablet Take 1 tablet (75 mg total) by mouth daily.   [DISCONTINUED] pantoprazole (PROTONIX) 40 MG tablet Take 1 tablet (40 mg total) by mouth daily.   diclofenac (VOLTAREN) 75 MG EC tablet Take 1 tablet (75 mg total) by mouth 2 (two) times daily.   pantoprazole (PROTONIX) 40 MG tablet Take 1 tablet (40 mg total) by mouth daily.   No facility-administered encounter medications on file as of 12/18/2022.    No Known Allergies  Review of Systems      Objective:  BP 135/74   Pulse 80   Temp (!) 97.4 F (36.3 C) (Temporal)   Ht '5\' 1"'$  (1.549 m)   Wt 147 lb 9.6 oz (67 kg)   SpO2 100%   BMI 27.89 kg/m    Wt Readings from Last 3 Encounters:  12/18/22 147 lb 9.6 oz (67 kg)  12/10/22 149 lb 3.2 oz (67.7 kg)  12/02/22 148 lb 3.2 oz (67.2 kg)    Physical Exam Vitals and nursing note reviewed.  Constitutional:      General: She is not in acute distress.    Appearance: Normal appearance. She is well-developed and well-groomed. She is not ill-appearing, toxic-appearing or diaphoretic.  HENT:     Head: Normocephalic and atraumatic.     Jaw: There is normal jaw occlusion.     Right Ear: Hearing normal.     Left Ear: Hearing normal.     Nose: Nose normal.     Mouth/Throat:     Lips: Pink.     Mouth: Mucous membranes are moist.     Pharynx: Uvula midline.  Eyes:     General: Lids are normal.     Pupils: Pupils are equal, round, and reactive to light.  Neck:     Thyroid: No thyroid mass, thyromegaly or thyroid tenderness.     Vascular: No carotid bruit or JVD.     Trachea: Trachea and phonation normal.  Cardiovascular:     Rate and Rhythm: Normal rate and regular rhythm.     Chest Wall: PMI is not displaced.     Pulses: Normal pulses.     Heart sounds: Normal  heart sounds. No murmur heard.    No friction rub. No gallop.  Pulmonary:     Effort: Pulmonary effort is normal. No respiratory distress.     Breath sounds: Normal breath sounds. No wheezing.  Abdominal:     General: There is no abdominal bruit.     Palpations: There is no hepatomegaly or splenomegaly.  Musculoskeletal:     Cervical back: Normal range of motion and neck supple.     Left lower leg: No edema.  Lymphadenopathy:     Cervical: No cervical adenopathy.  Skin:    General: Skin is warm and dry.     Capillary Refill: Capillary refill takes less than 2 seconds.     Coloration: Skin is not cyanotic, jaundiced or pale.     Findings: No rash.       Neurological:     General: No focal deficit present.     Mental Status: She is alert and oriented to person, place, and time.     Sensory: Sensation is intact.     Motor: Motor function is intact.     Coordination: Coordination is intact.     Gait: Gait is intact.     Deep Tendon Reflexes: Reflexes are normal and symmetric.  Psychiatric:        Attention and Perception: Attention and perception normal.        Mood and Affect: Mood and affect normal.        Speech: Speech normal.        Behavior: Behavior normal. Behavior is cooperative.        Thought Content: Thought content normal.        Cognition and Memory: Cognition and memory normal.        Judgment: Judgment normal.     Results for orders placed or performed in visit on 12/02/22  CBC with Differential/Platelet  Result Value Ref Range   WBC 5.9 3.4 - 10.8 x10E3/uL   RBC 4.24 3.77 - 5.28 x10E6/uL   Hemoglobin 10.3 (L) 11.1 - 15.9 g/dL   Hematocrit 33.3 (L) 34.0 - 46.6 %   MCV 79 79 - 97 fL   MCH 24.3 (L) 26.6 - 33.0 pg   MCHC 30.9 (L) 31.5 - 35.7 g/dL   RDW 17.5 (H) 11.7 - 15.4 %   Platelets 469 (H) 150 - 450 x10E3/uL   Neutrophils 59 Not Estab. %   Lymphs 32 Not Estab. %   Monocytes 6 Not Estab. %   Eos 2 Not Estab. %   Basos 1 Not Estab. %   Neutrophils  Absolute 3.5 1.4 - 7.0 x10E3/uL   Lymphocytes Absolute 1.9 0.7 - 3.1 x10E3/uL   Monocytes Absolute 0.4 0.1 - 0.9 x10E3/uL   EOS (ABSOLUTE) 0.1 0.0 - 0.4 x10E3/uL   Basophils Absolute 0.0 0.0 - 0.2 x10E3/uL   Immature Granulocytes 0 Not Estab. %   Immature Grans (Abs) 0.0 0.0 - 0.1 x10E3/uL  CMP14+EGFR  Result Value Ref Range   Glucose 86 70 - 99 mg/dL   BUN 15 8 - 27 mg/dL   Creatinine, Ser 1.04 (H) 0.57 - 1.00 mg/dL   eGFR 56 (L) >59 mL/min/1.73   BUN/Creatinine Ratio 14 12 - 28   Sodium 143 134 - 144 mmol/L   Potassium 3.9 3.5 - 5.2 mmol/L   Chloride 103 96 - 106 mmol/L   CO2 24 20 - 29 mmol/L   Calcium 10.2 8.7 - 10.3 mg/dL   Total Protein 6.3 6.0 - 8.5 g/dL   Albumin 4.4 3.8 - 4.8 g/dL   Globulin, Total 1.9 1.5 - 4.5 g/dL   Albumin/Globulin Ratio 2.3 (H) 1.2 - 2.2   Bilirubin Total <0.2 0.0 - 1.2 mg/dL   Alkaline Phosphatase 67 44 - 121 IU/L   AST 14 0 - 40 IU/L   ALT 14 0 - 32 IU/L  Thyroid Panel With TSH  Result Value Ref Range   TSH 0.542 0.450 - 4.500 uIU/mL   T4, Total 8.4 4.5 - 12.0 ug/dL   T3 Uptake Ratio 29 24 - 39 %   Free Thyroxine Index 2.4 1.2 - 4.9  Bayer DCA Hb A1c Waived  Result Value Ref Range   HB A1C (BAYER DCA - WAIVED) 6.6 (H) 4.8 - 5.6 %     Suture Removal  Date/Time: 12/18/2022 11:49 AM  Performed by: Baruch Gouty, FNP Authorized by: Baruch Gouty, FNP  Body area: upper extremity (right axilla) Wound Appearance: clean Sutures Removed: 1 Facility: sutures placed in this facility Patient tolerance: patient tolerated the procedure well with no immediate complications      Pertinent labs & imaging results that were available during my care of the patient were reviewed by me and considered in my medical decision making.  Assessment & Plan:  Zarai was seen today for suture / staple removal and wound check.  Diagnoses and all orders for this visit:  Visit for wound check Suture removed from right axilla. Healing well. Wound care  discussed in detail.   Needed refills today, sent to pharmacy Chronic pain of multiple joints -     diclofenac (VOLTAREN) 75 MG EC tablet; Take 1 tablet (75 mg total) by mouth 2 (two) times daily.  Gastroesophageal reflux disease without esophagitis -     pantoprazole (PROTONIX) 40 MG tablet; Take 1 tablet (40 mg total) by mouth daily.     Continue all other maintenance medications.  Follow up plan: Return if symptoms worsen or fail to improve.   Continue healthy lifestyle choices, including diet (rich in fruits, vegetables, and lean proteins, and low in salt and simple carbohydrates) and exercise (at least 30 minutes of moderate physical activity daily).  Educational handout given for wound care  The above assessment and management plan was discussed with the patient. The patient verbalized understanding of and has agreed to the management plan. Patient is aware to call the clinic if they develop any new symptoms or if symptoms persist or worsen. Patient is aware when to return to the clinic for a follow-up visit. Patient educated on when it is appropriate to go to the emergency department.   Monia Pouch, FNP-C [facility] [phone]

## 2022-12-29 MED ORDER — DICLOFENAC SODIUM 75 MG PO TBEC: 75.0000 mg | DELAYED_RELEASE_TABLET | Freq: Two times a day (BID) | ORAL | 2 refills | Status: DC

## 2022-12-29 NOTE — Addendum Note (Signed)
Addended by: Antonietta Barcelona D on: 12/29/2022 10:03 AM   Modules accepted: Orders

## 2022-12-29 NOTE — Progress Notes (Signed)
Diclofenac failed again, resent.

## 2022-12-31 DIAGNOSIS — M79673 Pain in unspecified foot: Secondary | ICD-10-CM | POA: Diagnosis not present

## 2022-12-31 DIAGNOSIS — G5790 Unspecified mononeuropathy of unspecified lower limb: Secondary | ICD-10-CM | POA: Diagnosis not present

## 2023-01-04 ENCOUNTER — Telehealth: Payer: Self-pay | Admitting: Family Medicine

## 2023-01-04 DIAGNOSIS — E119 Type 2 diabetes mellitus without complications: Secondary | ICD-10-CM

## 2023-01-04 MED ORDER — METFORMIN HCL 500 MG PO TABS: ORAL_TABLET | ORAL | 0 refills | Status: DC

## 2023-01-04 NOTE — Telephone Encounter (Signed)
90day supply sent.

## 2023-01-04 NOTE — Telephone Encounter (Signed)
  Prescription Request  01/04/2023  Is this a "Controlled Substance" medicine? no  Have you seen your PCP in the last 2 weeks? 12/02/2022 upcoming apt in May  If YES, route message to pool  -  If NO, patient needs to be scheduled for appointment.  What is the name of the medication or equipment? metFORMIN (GLUCOPHAGE) 500 MG tablet  90 day supply  Have you contacted your pharmacy to request a refill? NO please fax [facility] mail order  Which pharmacy would you like this sent to? [facility] mail order   Patient notified that their request is being sent to the clinical staff for review and that they should receive a response within 2 business days.

## 2023-01-06 ENCOUNTER — Ambulatory Visit (INDEPENDENT_AMBULATORY_CARE_PROVIDER_SITE_OTHER): Payer: Medicare Other

## 2023-01-06 VITALS — Ht 61.0 in | Wt 147.0 lb

## 2023-01-06 DIAGNOSIS — Z0000 Encounter for general adult medical examination without abnormal findings: Secondary | ICD-10-CM

## 2023-01-06 NOTE — Progress Notes (Signed)
Subjective:   Andrea Shannon is a 77 y.o. female who presents for an Initial Medicare Annual Wellness Visit.  I connected with  Andrea Shannon on 01/06/23 by a audio enabled telemedicine application and verified that I am speaking with the correct person using two identifiers.  Patient Location: Home  Provider Location: Home Office  I discussed the limitations of evaluation and management by telemedicine. The patient expressed understanding and agreed to proceed.  Review of Systems     Cardiac Risk Factors include: advanced age (>60men, >18 women);diabetes mellitus;hypertension     Objective:    Today's Vitals   01/06/23 1119  Weight: 147 lb (66.7 kg)  Height: 5\' 1"  (1.549 m)   Body mass index is 27.78 kg/m.     01/06/2023   11:49 AM 12/31/2021   11:26 AM  Advanced Directives  Does Patient Have a Medical Advance Directive? No No  Would patient like information on creating a medical advance directive? No - Patient declined No - Patient declined    Current Medications (verified) Outpatient Encounter Medications as of 01/06/2023  Medication Sig   aspirin 81 MG chewable tablet Chew by mouth daily.   diclofenac (VOLTAREN) 75 MG EC tablet Take 1 tablet (75 mg total) by mouth 2 (two) times daily.   levothyroxine (SYNTHROID) 75 MCG tablet Take 1 tablet (75 mcg total) by mouth daily before breakfast.   lisinopril-hydrochlorothiazide (ZESTORETIC) 20-12.5 MG tablet Take 1 tablet by mouth daily.   metFORMIN (GLUCOPHAGE) 500 MG tablet TAKE 1 TABLET BY MOUTH TWICE A DAY WITH A MEAL   pantoprazole (PROTONIX) 40 MG tablet Take 1 tablet (40 mg total) by mouth daily.   sucralfate (CARAFATE) 1 g tablet Take 1 tablet (1 g total) by mouth 2 (two) times daily.   No facility-administered encounter medications on file as of 01/06/2023.    Allergies (verified) Patient has no known allergies.   History: Past Medical History:  Diagnosis Date   Acid reflux    Arthritis    Diabetes  mellitus, type II ([place])    Hypertension    Hyperthyroidism    Hypothyroidism    Past Surgical History:  Procedure Laterality Date   ABDOMINAL HYSTERECTOMY     Family History  Problem Relation Age of Onset   Diabetes Mother    Asthma Father    Heart disease Sister        CHF   Diabetes Sister    Asthma Sister    Arthritis Sister    Diabetes Son    Social History   Socioeconomic History   Marital status: Widowed    Spouse name: Not on file   Number of children: 1   Years of education: 12   Highest education level: High school graduate  Occupational History   Not on file  Tobacco Use   Smoking status: Never   Smokeless tobacco: Never  Vaping Use   Vaping Use: Never used  Substance and Sexual Activity   Alcohol use: Not Currently   Drug use: Never   Sexual activity: Not Currently    Birth control/protection: Surgical  Other Topics Concern   Not on file  Social History Narrative   Not on file   Social Determinants of Health   Financial Resource Strain: Low Risk  (01/06/2023)   Overall Financial Resource Strain (CARDIA)    Difficulty of Paying Living Expenses: Not hard at all  Food Insecurity: No Food Insecurity (01/06/2023)   Hunger Vital Sign    Worried About  Running Out of Food in the Last Year: Never true    [place] in the Last Year: Never true  Transportation Needs: No Transportation Needs (01/06/2023)   PRAPARE - Hydrologist (Medical): No    Lack of Transportation (Non-Medical): No  Physical Activity: Insufficiently Active (01/06/2023)   Exercise Vital Sign    Days of Exercise per Week: 3 days    Minutes of Exercise per Session: 30 min  Stress: No Stress Concern Present (01/06/2023)   [place]    Feeling of Stress : Not at all  Social Connections: Moderately Integrated (01/06/2023)   Social Connection and Isolation Panel [NHANES]    Frequency of  Communication with Friends and Family: More than three times a week    Frequency of Social Gatherings with Friends and Family: More than three times a week    Attends Religious Services: More than 4 times per year    Active Member of [employer] or Organizations: Yes    Attends Archivist Meetings: More than 4 times per year    Marital Status: Widowed    Tobacco Counseling Counseling given: Not Answered   Clinical Intake:  Pre-visit preparation completed: Yes  Pain : No/denies pain  Diabetes: Yes CBG done?: No Did pt. bring in CBG monitor from home?: No  How often do you need to have someone help you when you read instructions, pamphlets, or other written materials from your doctor or pharmacy?: 1 - Never  Diabetic?Yes   Nutrition Risk Assessment:  Has the patient had any N/V/D within the last 2 months?  No  Does the patient have any non-healing wounds?  No  Has the patient had any unintentional weight loss or weight gain?  No   Diabetes:  Is the patient diabetic?  Yes  If diabetic, was a CBG obtained today?  No  Did the patient bring in their glucometer from home?  No  How often do you monitor your CBG's? Doesn't monitor.   Financial Strains and Diabetes Management:  Are you having any financial strains with the device, your supplies or your medication? No .  Does the patient want to be seen by Chronic Care Management for management of their diabetes?  No  Would the patient like to be referred to a Nutritionist or for Diabetic Management?  No   Diabetic Exams:  Diabetic Eye Exam: Completed records requested Diabetic Foot Exam: Completed 04/22/22   Interpreter Needed?: No  Information entered by :: Denman George LPN   Activities of Daily Living    01/06/2023   11:49 AM  In your present state of health, do you have any difficulty performing the following activities:  Hearing? 0  Vision? 0  Difficulty concentrating or making decisions? 0  Walking or  climbing stairs? 0  Dressing or bathing? 0  Doing errands, shopping? 0  Preparing Food and eating ? N  Using the Toilet? N  In the past six months, have you accidently leaked urine? N  Do you have problems with loss of bowel control? N  Managing your Medications? N  Managing your Finances? N  Housekeeping or managing your Housekeeping? N    Patient Care Team: Baruch Gouty, FNP as PCP - General (Family Medicine) Garner Gavel, OD (Optometry) Brita Romp, NP as Nurse Practitioner (Nurse Practitioner) Steffanie Rainwater, DPM as Consulting Physician (Podiatry)  Indicate any recent Medical Services you [phone]  have received from other than [facility] providers in the past year (date may be approximate).     Assessment:   This is a routine wellness examination for Ekam.  Hearing/Vision screen Hearing Screening - Comments:: Denies hearing difficulties  Vision Screening - Comments:: up to date with routine eye exams (provider in [place])    Dietary issues and exercise activities discussed: Current Exercise Habits: Home exercise routine, Type of exercise: walking, Time (Minutes): 30, Frequency (Times/Week): 3, Weekly Exercise (Minutes/Week): 90, Intensity: Mild   Goals Addressed   None   Depression Screen    01/06/2023   11:25 AM 12/02/2022    9:43 AM 09/29/2022    3:02 PM 08/28/2022    8:27 AM 04/22/2022   10:43 AM 01/19/2022    9:18 AM 12/31/2021   11:23 AM  PHQ 2/9 Scores  PHQ - 2 Score 0 0 0 0 5 1 0  PHQ- 9 Score  0  0 17 5     Fall Risk    12/02/2022    9:43 AM 09/29/2022    3:02 PM 08/28/2022    8:24 AM 04/22/2022   10:47 AM 01/19/2022    9:18 AM  Fall Risk   Falls in the past year? 0 0 0 0 0  Number falls in past yr:   0    Injury with Fall?   0    Follow up   Falls evaluation completed      [place]:  Any stairs in or around the home? No  If so, are there any without handrails? No  Home free of loose throw rugs in  walkways, pet beds, electrical cords, etc? Yes  Adequate lighting in your home to reduce risk of falls? Yes   ASSISTIVE DEVICES UTILIZED TO PREVENT FALLS:  Life alert? No  Use of a cane, walker or w/c? No  Grab bars in the bathroom? Yes  Shower chair or bench in shower? No  Elevated toilet seat or a handicapped toilet? Yes   TIMED UP AND GO:  Was the test performed? No . Telephonic visit   Cognitive Function:        01/06/2023   11:50 AM 12/31/2021   11:35 AM  6CIT Screen  What Year? 0 points 0 points  What month? 0 points 0 points  What time? 0 points 0 points  Count back from 20 0 points 0 points  Months in reverse 0 points 0 points  Repeat phrase 0 points 0 points  Total Score 0 points 0 points    Immunizations  There is no immunization history on file for this patient.  TDAP status: Due, Education has been provided regarding the importance of this vaccine. Advised may receive this vaccine at local pharmacy or Health Dept. Aware to provide a copy of the vaccination record if obtained from local pharmacy or Health Dept. Verbalized acceptance and understanding.  Flu Vaccine status: Declined, Education has been provided regarding the importance of this vaccine but patient still declined. Advised may receive this vaccine at local pharmacy or Health Dept. Aware to provide a copy of the vaccination record if obtained from local pharmacy or Health Dept. Verbalized acceptance and understanding.  Pneumococcal vaccine status: Declined,  Education has been provided regarding the importance of this vaccine but patient still declined. Advised may receive this vaccine at local pharmacy or Health Dept. Aware to provide a copy of the vaccination record if obtained from local pharmacy or Health Dept.  Verbalized acceptance and understanding.   Covid-19 vaccine status: Declined, Education has been provided regarding the importance of this vaccine but patient still declined. Advised may  receive this vaccine at local pharmacy or Health Dept.or vaccine clinic. Aware to provide a copy of the vaccination record if obtained from local pharmacy or Health Dept. Verbalized acceptance and understanding. / Qualifies for Shingles Vaccine? /Yes   Zostavax completed No   Shingrix Completed?: No.    Education has been provided regarding the importance of this vaccine. Patient has been advised to call insurance company to determine out of pocket expense if they have not yet received this vaccine. Advised may also receive vaccine at local pharmacy or Health Dept. Verbalized acceptance and understanding.  Screening Tests Health Maintenance  Topic Date Due   COVID-19 Vaccine (1) Never done   OPHTHALMOLOGY EXAM  Never done   INFLUENZA VACCINE  01/17/2023 (Originally 05/19/2022)   Zoster Vaccines- Shingrix (1 of 2) 01/20/2023 (Originally 01/08/1998)   Pneumonia Vaccine 60+ Years old (1 of 1 - PCV) 12/03/2023 (Originally 01/08/2013)   Hepatitis C Screening  12/03/2023 (Originally 01/08/1964)   MAMMOGRAM  03/24/2023   FOOT EXAM  04/23/2023   HEMOGLOBIN A1C  06/02/2023   Diabetic kidney evaluation - Urine ACR  08/29/2023   Diabetic kidney evaluation - eGFR measurement  12/03/2023   Medicare Annual Wellness (AWV)  01/06/2024   DEXA SCAN  07/06/2024   COLONOSCOPY (Pts 45-23yrs Insurance coverage will need to be confirmed)  10/20/2027   HPV VACCINES  Aged Out   DTaP/Tdap/Td  Discontinued    Health Maintenance  Health Maintenance Due  Topic Date Due   COVID-19 Vaccine (1) Never done   OPHTHALMOLOGY EXAM  Never done    Colorectal cancer screening: Type of screening: Colonoscopy. Completed 2019. Repeat every 10 years  Mammogram status: Completed 03/23/22. Repeat every year  Bone Density status: Completed 07/06/22. Results reflect: Bone density results: NORMAL. Repeat every 5 years.  Lung Cancer Screening: (Low Dose CT Chest recommended if Age 45-80 years, 30 pack-year currently smoking OR have  quit w/in 15years.) does not qualify.   Lung Cancer Screening Referral: na/  Additional Screening:  Hepatitis C Screening: does qualify;  Vision Screening: Recommended annual ophthalmology exams for early detection of glaucoma and other disorders of the eye. Is the patient up to date with their annual eye exam?  Yes  Who is the provider or what is the name of the office in which the patient attends annual eye exams? Provider in [place] [facility]) If pt is not established with a provider, would they like to be referred to a provider to establish care? No .   Dental Screening: Recommended annual dental exams for proper oral hygiene  Community Resource Referral / Chronic Care Management: CRR required this visit?  No   CCM required this visit?  No      Plan:     I have personally reviewed and noted the following in the patient's chart:   Medical and social history Use of alcohol, tobacco or illicit drugs  Current medications and supplements including opioid prescriptions. Patient is not currently taking opioid prescriptions. Functional ability and status Nutritional status Physical activity Advanced directives List of other physicians Hospitalizations, surgeries, and ER visits in previous 12 months Vitals Screenings to include cognitive, depression, and falls Referrals and appointments  In addition, I have reviewed and discussed with patient certain preventive protocols, quality metrics, and best practice recommendations. A written personalized care plan for preventive services  as well as general preventive health recommendations were provided to patient.     Vanetta Mulders, [place]   [id number]   Due to this being a virtual visit, the after visit summary with patients personalized plan was offered to patient via mail or my-chart.  per request, patient was mailed a copy of AVS  Nurse Notes: No concerns

## 2023-01-06 NOTE — Patient Instructions (Signed)
Andrea Shannon , Thank you for taking time to come for your Medicare Wellness Visit. I appreciate your ongoing commitment to your health goals. Please review the following plan we discussed and let me know if I can assist you in the future.   These are the goals we discussed:  Goals      Exercise 3x per week (30 min per time)     Continue to exercise and watch carbs.         This is a list of the screening recommended for you and due dates:  Health Maintenance  Topic Date Due   COVID-19 Vaccine (1) Never done   Eye exam for diabetics  Never done   Flu Shot  01/17/2023*   Zoster (Shingles) Vaccine (1 of 2) 01/20/2023*   Pneumonia Vaccine (1 of 1 - PCV) 12/03/2023*   Hepatitis C Screening: USPSTF Recommendation to screen - Ages 18-79 yo.  12/03/2023*   Mammogram  03/24/2023   Complete foot exam   04/23/2023   Hemoglobin A1C  06/02/2023   Yearly kidney health urinalysis for diabetes  08/29/2023   Yearly kidney function blood test for diabetes  12/03/2023   Medicare Annual Wellness Visit  01/06/2024   DEXA scan (bone density measurement)  07/06/2024   Colon Cancer Screening  10/20/2027   HPV Vaccine  Aged Out   DTaP/Tdap/Td vaccine  Discontinued  *Topic was postponed. The date shown is not the original due date.    Advanced directives: Forms are available if you choose in the future to pursue completion.  This is recommended in order to make sure that your health wishes are honored in the event that you are unable to verbalize them to the provider.    Conditions/risks identified: Aim for 30 minutes of exercise or brisk walking, 6-8 glasses of water, and 5 servings of fruits and vegetables each day.   Next appointment: Follow up in one year for your annual wellness visit   Happy Andrea Shannon!!!!!   Preventive Care 65 Years and Older, Female Preventive care refers to lifestyle choices and visits with your health care provider that can promote health and wellness. What does preventive  care include? A yearly physical exam. This is also called an annual well check. Dental exams once or twice a year. Routine eye exams. Ask your health care provider how often you should have your eyes checked. Personal lifestyle choices, including: Daily care of your teeth and gums. Regular physical activity. Eating a healthy diet. Avoiding tobacco and drug use. Limiting alcohol use. Practicing safe sex. Taking low-dose aspirin every day. Taking vitamin and mineral supplements as recommended by your health care provider. What happens during an annual well check? The services and screenings done by your health care provider during your annual well check will depend on your age, overall health, lifestyle risk factors, and family history of disease. Counseling  Your health care provider may ask you questions about your: Alcohol use. Tobacco use. Drug use. Emotional well-being. Home and relationship well-being. Sexual activity. Eating habits. History of falls. Memory and ability to understand (cognition). Work and work Astronomer. Reproductive health. Screening  You may have the following tests or measurements: Height, weight, and BMI. Blood pressure. Lipid and cholesterol levels. These may be checked every 5 years, or more frequently if you are over 102 years old. Skin check. Lung cancer screening. You may have this screening every year starting at age 69 if you have a 30-pack-year history of smoking and currently smoke or  have quit within the past 15 years. Fecal occult blood test (FOBT) of the stool. You may have this test every year starting at age 96. Flexible sigmoidoscopy or colonoscopy. You may have a sigmoidoscopy every 5 years or a colonoscopy every 10 years starting at age 53. Hepatitis C blood test. Hepatitis B blood test. Sexually transmitted disease (STD) testing. Diabetes screening. This is done by checking your blood sugar (glucose) after you have not eaten for a  while (fasting). You may have this done every 1-3 years. Bone density scan. This is done to screen for osteoporosis. You may have this done starting at age 15. Mammogram. This may be done every 1-2 years. Talk to your health care provider about how often you should have regular mammograms. Talk with your health care provider about your test results, treatment options, and if necessary, the need for more tests. Vaccines  Your health care provider may recommend certain vaccines, such as: Influenza vaccine. This is recommended every year. Tetanus, diphtheria, and acellular pertussis (Tdap, Td) vaccine. You may need a Td booster every 10 years. Zoster vaccine. You may need this after age 21. Pneumococcal 13-valent conjugate (PCV13) vaccine. One dose is recommended after age 44. Pneumococcal polysaccharide (PPSV23) vaccine. One dose is recommended after age 53. Talk to your health care provider about which screenings and vaccines you need and how often you need them. This information is not intended to replace advice given to you by your health care provider. Make sure you discuss any questions you have with your health care provider. Document Released: 11/01/2015 Document Revised: 06/24/2016 Document Reviewed: 08/06/2015 Elsevier Interactive Patient Education  2017 [place] Prevention in the Home Falls can cause injuries. They can happen to people of all ages. There are many things you can do to make your home safe and to help prevent falls. What can I do on the outside of my home? Regularly fix the edges of walkways and driveways and fix any cracks. Remove anything that might make you trip as you walk through a door, such as a raised step or threshold. Trim any bushes or trees on the path to your home. Use bright outdoor lighting. Clear any walking paths of anything that might make someone trip, such as rocks or tools. Regularly check to see if handrails are loose or broken. Make  sure that both sides of any steps have handrails. Any raised decks and porches should have guardrails on the edges. Have any leaves, snow, or ice cleared regularly. Use sand or salt on walking paths during winter. Clean up any spills in your garage right away. This includes oil or grease spills. What can I do in the bathroom? Use night lights. Install grab bars by the toilet and in the tub and shower. Do not use towel bars as grab bars. Use non-skid mats or decals in the tub or shower. If you need to sit down in the shower, use a plastic, non-slip stool. Keep the floor dry. Clean up any water that spills on the floor as soon as it happens. Remove soap buildup in the tub or shower regularly. Attach bath mats securely with double-sided non-slip rug tape. Do not have throw rugs and other things on the floor that can make you trip. What can I do in the bedroom? Use night lights. Make sure that you have a light by your bed that is easy to reach. Do not use any sheets or blankets that are too big for your  bed. They should not hang down onto the floor. Have a firm chair that has side arms. You can use this for support while you get dressed. Do not have throw rugs and other things on the floor that can make you trip. What can I do in the kitchen? Clean up any spills right away. Avoid walking on wet floors. Keep items that you use a lot in easy-to-reach places. If you need to reach something above you, use a strong step stool that has a grab bar. Keep electrical cords out of the way. Do not use floor polish or wax that makes floors slippery. If you must use wax, use non-skid floor wax. Do not have throw rugs and other things on the floor that can make you trip. What can I do with my stairs? Do not leave any items on the stairs. Make sure that there are handrails on both sides of the stairs and use them. Fix handrails that are broken or loose. Make sure that handrails are as long as the  stairways. Check any carpeting to make sure that it is firmly attached to the stairs. Fix any carpet that is loose or worn. Avoid having throw rugs at the top or bottom of the stairs. If you do have throw rugs, attach them to the floor with carpet tape. Make sure that you have a light switch at the top of the stairs and the bottom of the stairs. If you do not have them, ask someone to add them for you. What else can I do to help prevent falls? Wear shoes that: Do not have high heels. Have rubber bottoms. Are comfortable and fit you well. Are closed at the toe. Do not wear sandals. If you use a stepladder: Make sure that it is fully opened. Do not climb a closed stepladder. Make sure that both sides of the stepladder are locked into place. Ask someone to hold it for you, if possible. Clearly mark and make sure that you can see: Any grab bars or handrails. First and last steps. Where the edge of each step is. Use tools that help you move around (mobility aids) if they are needed. These include: Canes. Walkers. Scooters. Crutches. Turn on the lights when you go into a dark area. Replace any light bulbs as soon as they burn out. Set up your furniture so you have a clear path. Avoid moving your furniture around. If any of your floors are uneven, fix them. If there are any pets around you, be aware of where they are. Review your medicines with your doctor. Some medicines can make you feel dizzy. This can increase your chance of falling. Ask your doctor what other things that you can do to help prevent falls. This information is not intended to replace advice given to you by your health care provider. Make sure you discuss any questions you have with your health care provider. Document Released: 08/01/2009 Document Revised: 03/12/2016 Document Reviewed: 11/09/2014 Elsevier Interactive Patient Education  2017 [employer].

## 2023-01-11 ENCOUNTER — Telehealth: Payer: Self-pay | Admitting: Nurse Practitioner

## 2023-01-11 NOTE — Telephone Encounter (Signed)
Lets just have her go ahead and repeat her thyroid labs now and then I will be in contact with her about her medications.

## 2023-01-11 NOTE — Telephone Encounter (Signed)
Pt stated she would like an appointment before December.  Pt stated she was still have issues with being hot and then cold.  Can I overbook somewhere to get her worked in?

## 2023-01-11 NOTE — Telephone Encounter (Signed)
Let pt know what Andrea Shannon stated.

## 2023-01-13 ENCOUNTER — Other Ambulatory Visit (HOSPITAL_COMMUNITY)
Admission: RE | Admit: 2023-01-13 | Discharge: 2023-01-13 | Disposition: A | Discharge status: Home / Self Care | Payer: Medicare Other | Source: Ambulatory Visit | Attending: Nurse Practitioner | Admitting: Nurse Practitioner

## 2023-01-13 DIAGNOSIS — D649 Anemia, unspecified: Secondary | ICD-10-CM | POA: Insufficient documentation

## 2023-01-13 DIAGNOSIS — R7989 Other specified abnormal findings of blood chemistry: Secondary | ICD-10-CM | POA: Diagnosis not present

## 2023-01-13 LAB — T4, FREE: Free T4: 1.05 ng/dL (ref 0.61–1.12)

## 2023-01-13 LAB — TSH: TSH: 1.112 u[IU]/mL (ref 0.350–4.500)

## 2023-01-22 ENCOUNTER — Telehealth: Payer: Self-pay | Admitting: *Deleted

## 2023-01-22 NOTE — Telephone Encounter (Signed)
Patient called and ask about her lab results that she had drawn 01/13/23.  I shared with her that they were normal. She is asking about making appointment to see Alphonzo Lemmings again , she is having bad hot spells and she is sweating really bad.

## 2023-01-25 NOTE — Telephone Encounter (Signed)
We can bring her in sooner to discuss.  Will forward to [place] to get her scheduled.

## 2023-01-25 NOTE — Telephone Encounter (Signed)
May I ask you to please give patient a sooner appointment and to also call her. Thank you.

## 2023-01-26 ENCOUNTER — Encounter: Payer: Self-pay | Admitting: Nurse Practitioner

## 2023-01-26 ENCOUNTER — Ambulatory Visit (INDEPENDENT_AMBULATORY_CARE_PROVIDER_SITE_OTHER): Payer: Medicare Other | Admitting: Nurse Practitioner

## 2023-01-26 VITALS — BP 104/65 | HR 81 | Ht 61.0 in | Wt 145.2 lb

## 2023-01-26 DIAGNOSIS — E890 Postprocedural hypothyroidism: Secondary | ICD-10-CM

## 2023-01-26 DIAGNOSIS — E0520 Thyrotoxicosis with toxic multinodular goiter without thyrotoxic crisis or storm: Secondary | ICD-10-CM | POA: Diagnosis not present

## 2023-01-26 MED ORDER — LEVOTHYROXINE SODIUM 75 MCG PO TABS: 75.0000 ug | ORAL_TABLET | Freq: Every day | ORAL | 0 refills | Status: DC

## 2023-01-26 MED ORDER — LEVOTHYROXINE SODIUM 75 MCG PO TABS: 75.0000 ug | ORAL_TABLET | Freq: Every day | ORAL | 1 refills | Status: DC

## 2023-01-26 NOTE — Patient Instructions (Signed)

## 2023-01-26 NOTE — Progress Notes (Signed)
01/26/2023, 11:39 AM     Endocrinology follow-up note    Subjective:    Patient ID: Andrea Shannon, female    DOB: October 04, 1946, PCP Rakes, Doralee Albino, FNP   Past Medical History:  Diagnosis Date   Acid reflux    Arthritis    Diabetes mellitus, type II    Hypertension    Hyperthyroidism    Hypothyroidism    Past Surgical History:  Procedure Laterality Date   ABDOMINAL HYSTERECTOMY     Social History   Socioeconomic History   Marital status: Widowed    Spouse name: Not on file   Number of children: 1   Years of education: 12   Highest education level: High school graduate  Occupational History   Not on file  Tobacco Use   Smoking status: Never   Smokeless tobacco: Never  Vaping Use   Vaping Use: Never used  Substance and Sexual Activity   Alcohol use: Not Currently   Drug use: Never   Sexual activity: Not Currently    Birth control/protection: Surgical  Other Topics Concern   Not on file  Social History Narrative   Not on file   Social Determinants of Health   Financial Resource Strain: Low Risk  (01/06/2023)   Overall Financial Resource Strain (CARDIA)    Difficulty of Paying Living Expenses: Not hard at all  Food Insecurity: No Food Insecurity (01/06/2023)   Hunger Vital Sign    Worried About Running Out of Food in the Last Year: Never true    Ran Out of Food in the Last Year: Never true  Transportation Needs: No Transportation Needs (01/06/2023)   PRAPARE - Administrator, Civil Service (Medical): No    Lack of Transportation (Non-Medical): No  Physical Activity: Insufficiently Active (01/06/2023)   Exercise Vital Sign    Days of Exercise per Week: 3 days    Minutes of Exercise per Session: 30 min  Stress: No Stress Concern Present (01/06/2023)   Andrea Shannon    Feeling of Stress : Not at all  Social Connections: Moderately  Integrated (01/06/2023)   Social Connection and Isolation Panel [NHANES]    Frequency of Communication with Friends and Family: More than three times a week    Frequency of Social Gatherings with Friends and Family: More than three times a week    Attends Religious Services: More than 4 times per year    Active Member of [employer] or Organizations: Yes    Attends Banker Meetings: More than 4 times per year    Marital Status: Widowed   Outpatient Encounter Medications as of 01/26/2023  Medication Sig   aspirin 81 MG chewable tablet Chew by mouth daily.   diclofenac (VOLTAREN) 75 MG EC tablet Take 1 tablet (75 mg total) by mouth 2 (two) times daily.   lisinopril-hydrochlorothiazide (ZESTORETIC) 20-12.5 MG tablet Take 1 tablet by mouth daily.   metFORMIN (GLUCOPHAGE) 500 MG tablet TAKE 1 TABLET BY MOUTH TWICE A DAY WITH A MEAL   pantoprazole (PROTONIX) 40 MG tablet Take 1 tablet (40 mg total) by mouth daily.   sucralfate (CARAFATE) 1 g tablet Take 1 tablet (1  g total) by mouth 2 (two) times daily.   [DISCONTINUED] levothyroxine (SYNTHROID) 75 MCG tablet Take 1 tablet (75 mcg total) by mouth daily before breakfast.   levothyroxine (SYNTHROID) 75 MCG tablet Take 1 tablet (75 mcg total) by mouth daily before breakfast.   [DISCONTINUED] levothyroxine (SYNTHROID) 75 MCG tablet Take 1 tablet (75 mcg total) by mouth daily before breakfast.   No facility-administered encounter medications on file as of 01/26/2023.   ALLERGIES: No Known Allergies  VACCINATION STATUS:  There is no immunization history on file for this patient.  Thyroid Problem Presents for follow-up (-She was treated with I-131 on September 29, 2019 for toxic multinodular goiter.   She reports family history of thyroid dysfunction, family history of thyroid malignancy.) visit. Symptoms include heat intolerance. Patient reports no anxiety, cold intolerance, constipation, depressed mood, diarrhea, fatigue, leg swelling,  palpitations, tremors, weight gain or weight loss. (Says recently she has been excessively sweating, hot flashes, even without doing anything to exert such.  She notes night sweats as well.  She did tell me she is trying to use up her supply of 50 mcg pills of her Levothyroxine, so has been splitting tablets to equal the dose she needs and when it splits, it does not split evenly.) The symptoms have been stable.     Andrea Shannon is 77 y.o. female who presents today with a medical history as above.  Review of systems  Constitutional: + stable body weight  current Body mass index is 27.44 kg/m. , no fatigue, + subjective hyperthermia-intermittent but worsening recently, no subjective hypothermia Eyes: no blurry vision, no xerophthalmia ENT: no sore throat, no nodules palpated in throat, no dysphagia/odynophagia, no hoarseness Cardiovascular: no chest pain, no shortness of breath, + intermittent palpitations, no leg swelling Respiratory: no cough, no shortness of breath Gastrointestinal: no nausea/vomiting/diarrhea Musculoskeletal: no muscle/joint aches Skin: no rashes, no hyperemia Neurological: no tremors, no numbness, no tingling, no dizziness Psychiatric: no depression, no anxiety  Objective:    BP 104/65 (BP Location: Left Arm, Patient Position: Sitting, Cuff Size: Large)   Pulse 81   Ht 5\' 1"  (1.549 m)   Wt 145 lb 3.2 oz (65.9 kg)   BMI 27.44 kg/m   Wt Readings from Last 3 Encounters:  01/26/23 145 lb 3.2 oz (65.9 kg)  01/06/23 147 lb (66.7 kg)  12/18/22 147 lb 9.6 oz (67 kg)     BP Readings from Last 3 Encounters:  01/26/23 104/65  12/18/22 135/74  12/10/22 132/73    Physical Exam- Limited  Constitutional:  Body mass index is 27.44 kg/m. , not in acute distress, normal state of mind Eyes:  EOMI, no exophthalmos Musculoskeletal: no gross deformities, strength intact in all four extremities, no gross restriction of joint movements Skin:  no rashes, no  hyperemia Neurological: no tremor with outstretched hands   Recent Results (from the past 2160 hour(s))  Bayer DCA Hb A1c Waived     Status: Abnormal   Collection Time: 12/02/22  9:42 AM  Result Value Ref Range   HB A1C (BAYER DCA - WAIVED) 6.6 (H) 4.8 - 5.6 %    Comment:          Prediabetes: 5.7 - 6.4          Diabetes: >6.4          Glycemic control for adults with diabetes: <7.0   CBC with Differential/Platelet     Status: Abnormal   Collection Time: 12/02/22 10:07 AM  Result Value Ref  Range   WBC 5.9 3.4 - 10.8 x10E3/uL   RBC 4.24 3.77 - 5.28 x10E6/uL   Hemoglobin 10.3 (L) 11.1 - 15.9 g/dL   Hematocrit 16.1 (L) 09.6 - 46.6 %   MCV 79 79 - 97 fL   MCH 24.3 (L) 26.6 - 33.0 pg   MCHC 30.9 (L) 31.5 - 35.7 g/dL   RDW 04.5 (H) 40.9 - 81.1 %   Platelets 469 (H) 150 - 450 x10E3/uL   Neutrophils 59 Not Estab. %   Lymphs 32 Not Estab. %   Monocytes 6 Not Estab. %   Eos 2 Not Estab. %   Basos 1 Not Estab. %   Neutrophils Absolute 3.5 1.4 - 7.0 x10E3/uL   Lymphocytes Absolute 1.9 0.7 - 3.1 x10E3/uL   Monocytes Absolute 0.4 0.1 - 0.9 x10E3/uL   EOS (ABSOLUTE) 0.1 0.0 - 0.4 x10E3/uL   Basophils Absolute 0.0 0.0 - 0.2 x10E3/uL   Immature Granulocytes 0 Not Estab. %   Immature Grans (Abs) 0.0 0.0 - 0.1 x10E3/uL  CMP14+EGFR     Status: Abnormal   Collection Time: 12/02/22 10:07 AM  Result Value Ref Range   Glucose 86 70 - 99 mg/dL   BUN 15 8 - 27 mg/dL   Creatinine, Ser 9.14 (H) 0.57 - 1.00 mg/dL   eGFR 56 (L) >78 GN/FAO/1.30   BUN/Creatinine Ratio 14 12 - 28   Sodium 143 134 - 144 mmol/L   Potassium 3.9 3.5 - 5.2 mmol/L   Chloride 103 96 - 106 mmol/L   CO2 24 20 - 29 mmol/L   Calcium 10.2 8.7 - 10.3 mg/dL   Total Protein 6.3 6.0 - 8.5 g/dL   Albumin 4.4 3.8 - 4.8 g/dL   Globulin, Total 1.9 1.5 - 4.5 g/dL   Albumin/Globulin Ratio 2.3 (H) 1.2 - 2.2   Bilirubin Total <0.2 0.0 - 1.2 mg/dL   Alkaline Phosphatase 67 44 - 121 IU/L   AST 14 0 - 40 IU/L   ALT 14 0 - 32 IU/L   Thyroid Panel With TSH     Status: None   Collection Time: 12/02/22 10:07 AM  Result Value Ref Range   TSH 0.542 0.450 - 4.500 uIU/mL   T4, Total 8.4 4.5 - 12.0 ug/dL   T3 Uptake Ratio 29 24 - 39 %   Free Thyroxine Index 2.4 1.2 - 4.9  TSH     Status: None   Collection Time: 01/13/23 11:46 AM  Result Value Ref Range   TSH 1.112 0.350 - 4.500 uIU/mL    Comment: Performed by a 3rd Generation assay with a functional sensitivity of <=0.01 uIU/mL. Performed at [facility], [address], [place], [place] [address]   T4, free     Status: None   Collection Time: 01/13/23 11:56 AM  Result Value Ref Range   Free T4 1.05 0.61 - 1.12 ng/dL    Comment: (NOTE) Biotin ingestion may interfere with free T4 tests. If the results are inconsistent with the TSH level, previous test results, or the clinical presentation, then consider biotin interference. If needed, order repeat testing after stopping biotin. Performed at [facility], [address], [place], [place] [address]    Ultrasound of the thyroid on June 27, 2018 showed right lobe measuring 4.9 x 1.9 x 2.1 cm with 1 dominant mixed cystic and solid nodule measuring 2.2 x 1.5 x 1.8 cm.  Another nodule in the superior right lobe measuring 1.9 x 0.9 x 1.2 cm. Left  lobe heterogeneous measuring 4.7 x 3.2 x 2.3 cm with dominant mixed cystic and solid nodule in the central left lobe measuring 2.6 x 2.4 x 2.3 cm.  Separate mixed cystic and solid nodule in the superior left lobe measuring 1.7 x 1.3 x 1.4 cm. Impression:  innumerable thyroid nodules, increasing probability of benignity.  2 suspicious right thyroid nodule measuring 1.9 x 0.9 x 1.2 cm in the superior right lobe.  Ultrasound-guided FNA is suggested.  More suspicious left thyroid nodule in the superior left lobe measuring 2.6 x 2.4 x 2.3 cm.  Ultrasound-guided FNA suggested.   Fine-needle aspiration on November 30, 2018 revealed atypia of undetermined significance,  afirma  testing-reported benign, with malignancy risk less than 4%.     Thyroid uptake and scan on September 04, 2019 showed enlarged thyroid gland with several focal areas of increased uptake, likely representing nodules.  No areas of photopenia.  24-hour uptake 35%.   Results for Junious SilkRESTON, Mikya (MRN [id number]) as of 08/12/2020 10:53  Ref. Range 10/19/2018 00:00 04/11/2019 00:00 08/14/2019 00:00 11/20/2019 00:00 01/22/2020 00:00  TSH Latest Ref Range: 0.41 - 5.90  0.48 1.26 0.01 (A) 0.01 (A) 0.04 (A)   US Thyroid 11/15/2018 Narrative & Impression  CLINICAL DATA:  Multinodular goiter   EXAM: THYROID ULTRASOUND   TECHNIQUE: Ultrasound examination of the thyroid gland and adjacent soft tissues was performed.   COMPARISON:  06/27/2018 by report only from [facility]   FINDINGS: Parenchymal Echotexture: Moderately heterogenous   Isthmus: 0.3 cm thickness, stable   Right lobe: 5.6 x 2.3 x 2 cm, previously 4.9 x 1.9 x 2.1   Left lobe: 6.1 x 3.3 x 2.3 cm, previously 4.7 x 3.2 x 2.3   _________________________________________________________   Estimated total number of nodules >/= 1 cm: 6-10   Number of spongiform nodules >/=  2 cm not described below (TR1): 0   Number of mixed cystic and solid nodules >/= 1.5 cm not described below (TR2): 0   _________________________________________________________   Nodule # 1:   Location: Right; Superior   Maximum size: 1.4 cm; Other 2 dimensions: 1.1 x 1 cm   Composition: mixed cystic and solid (1)   Echogenicity: hypoechoic (2)   Shape: not taller-than-wide (0)   Margins: ill-defined (0)   Echogenic foci: none (0)   ACR TI-RADS total points: 3.   ACR TI-RADS risk category: TR3 (3 points).   ACR TI-RADS recommendations:   Given size (<1.4 cm) and appearance, this nodule does NOT meet TI-RADS criteria for biopsy or dedicated follow-up.   _________________________________________________________   Nodule # 2: 2.7 x 1.9 x 2  cm spongiform nodule, mid right; This nodule does NOT meet TI-RADS criteria for biopsy or dedicated follow-up.   Nodule # 3: 1.5 x 0.9 x 1.2 cm spongiform nodule, inferior right; This nodule does NOT meet TI-RADS criteria for biopsy or dedicated follow-up.   Nodule # 4:   Location: Left; Superior   Maximum size: 2 cm; Other 2 dimensions: 1.7 x 1.7 cm   Composition: mixed cystic and solid (1)   Echogenicity: hypoechoic (2)   Shape: not taller-than-wide (0)   Margins: smooth (0)   Echogenic foci: none (0)   ACR TI-RADS total points: 3.   ACR TI-RADS risk category: TR3 (3 points).   ACR TI-RADS recommendations:   *Given size (>/= 1.5 - 2.4 cm) and appearance, a follow-up ultrasound in 1 year should be considered based on TI-RADS criteria.   _________________________________________________________   Nodule #  5:   Location: Left; Mid   Maximum size: 3 cm; Other 2 dimensions: 2.8 x 1.9 cm   Composition: mixed cystic and solid (1)   Echogenicity: isoechoic (1)   Shape: not taller-than-wide (0)   Margins: smooth (0)   Echogenic foci: none (0)   ACR TI-RADS total points: 2.   ACR TI-RADS risk category: TR2 (2 points).   ACR TI-RADS recommendations:   This nodule does NOT meet TI-RADS criteria for biopsy or dedicated follow-up.   _________________________________________________________   Nodule # 6:   Location: Left; Inferior   Maximum size: 3.2 cm; Other 2 dimensions: 2.7 x 1.9 cm   Composition: mixed cystic and solid (1)   Echogenicity: isoechoic (1)   Shape: not taller-than-wide (0)   Margins: smooth (0)   Echogenic foci: none (0)   ACR TI-RADS total points: 2.   ACR TI-RADS risk category: TR2 (2 points).   ACR TI-RADS recommendations:   This nodule does NOT meet TI-RADS criteria for biopsy or dedicated follow-up.   _________________________________________________________   Nodule # 7:   Location: Left; Inferior posterior    Maximum size: 3 cm; Other 2 dimensions: 2.9 x 2.3 cm   Composition: solid/almost completely solid (2)   Echogenicity: isoechoic (1)   Shape: taller-than-wide (3)   Margins: ill-defined (0)   Echogenic foci: none (0)   ACR TI-RADS total points: 6.   ACR TI-RADS risk category: TR4 (4-6 points).   ACR TI-RADS recommendations:   **Given size (>/= 1.5 cm) and appearance, fine needle aspiration of this moderately suspicious nodule should be considered based on TI-RADS criteria.   IMPRESSION: 1. Thyromegaly with bilateral nodules. 2. Recommend FNA biopsy of moderately suspicious 3 cm inferior posterior nodule #7; This was not described on the prior study. 3. Recommend annual/biennial ultrasound follow-up of additional lesion as above, until stability x5 years confirmed.   -------------------------------------------------------------------------------------------------------------------------------  FOLLOW UP THYROID US FROM 12/02/20 CLINICAL DATA:  Hypothyroidism. Left inferior thyroid nodule biopsy on 11/30/2018.   EXAM: THYROID ULTRASOUND   TECHNIQUE: Ultrasound examination of the thyroid gland and adjacent soft tissues was performed.   COMPARISON:  11/15/2018   FINDINGS: Parenchymal Echotexture: Moderately heterogenous   Isthmus: 0.2 cm, previously 0.3 cm   Right lobe: 4.9 x 1.7 x 1.6 cm, previously 5.6 x 2.3 x 2.0 cm   Left lobe: 5.1 x 2.2 x 1.7 cm, previously 6.1 x 2.8 x 2.3 cm   _________________________________________________________   Estimated total number of nodules >/= 1 cm: 5   Number of spongiform nodules >/=  2 cm not described below (TR1): 0   Number of mixed cystic and solid nodules >/= 1.5 cm not described below (TR2): 0   _________________________________________________________   Again noted are multiple right thyroid nodules. The right thyroid nodules are predominantly cystic or spongiform.   Nodule 2 in the right mid thyroid lobe has  markedly decreased in size. This is a spongiform or mixed cystic and solid nodule that measures 1.3 x 0.9 x 0.9 cm and previously measured 2.7 x 1.9 x 2.0 cm.   Nodule 3 in the right inferior thyroid is mixed cystic and solid nodule that measures roughly 1.5 x 1.0 x 1.2 cm and previously measured 1.3 x 1.2 x 0.9 cm.   Multiple left thyroid nodules that are predominantly mixed cystic and solid nodules.   Nodule 5 in the left mid thyroid lobe measures 1.3 x 0.5 x 0.5 cm and previously measured 2.8 x 1.9 x 3.0 cm. This is  a mixed cystic and solid nodule and the solid portion is isoechoic.   Nodule 6 is a mixed cystic and solid nodule in the left inferior thyroid lobe measures 1.6 x 0.9 x 1.5 cm and previously measured 2.7 x 1.9 x 3.2 cm.   Nodule 7 in the left inferior thyroid lobe represents the previously biopsied nodule. This nodule is slightly hypoechoic and has scattered cystic areas. This nodule measures 2.1 x 2.0 x 1.5 cm and previously measured 3.0 x 2.9 x 2.3 cm.   IMPRESSION: 1. Multinodular goiter. 2. Most of the nodules are mixed cystic and solid composition. Many of the nodules have decreased in size as described. 3. No new suspicious thyroid nodules.   The above is in keeping with the ACR TI-RADS recommendations - J Am Coll Radiol 2017;14:587-595.     Electronically Signed   By: Richarda Overlie M.D.   On: 12/02/2020 14:20   Latest Reference Range & Units 04/22/22 11:20 09/02/22 12:15 12/02/22 10:07 01/13/23 11:46 01/13/23 11:56  TSH 0.350 - 4.500 uIU/mL 0.329 (L) 0.152 (L) 0.542 1.112   T4,Free(Direct) 0.61 - 1.12 ng/dL  4.09 (H)   8.11  Thyroxine (T4) 4.5 - 12.0 ug/dL   8.4    Free Thyroxine Index 1.2 - 4.9    2.4    T3 Uptake Ratio 24 - 39 %   29    (L): Data is abnormally low (H): Data is abnormally high  Assessment & Plan:   1. RAI - induced hypothyroidism -She status post I-131 thyroid ablation on September 29, 2019.    -Her previsit TFTs are consistent  appropriate hormone replacement.  She is advised to continue Levothyroxine 75 mcg po daily (to stop using her 50 mcg supply and splitting tablets to ensure proper dosage consistently).   - We discussed about the correct intake of her thyroid hormone, on empty stomach at fasting, with water, separated by at least 30 minutes from breakfast and other medications,  and separated by more than 4 hours from calcium, iron, multivitamins, acid reflux medications (PPIs). -Patient is made aware of the fact that thyroid hormone replacement is needed for life, dose to be adjusted by periodic monitoring of thyroid function tests.  2.  Multinodular goiter -Her fine-needle aspiration was significant for atypia of undetermined significance, a sample was sent for afirma testing-with grossly benign finding with malignancy is less than 4%.  She will not need surgical intervention at this time.    -Her thyroid US from 10/2018 shows multiple nodules which recommended follow up with [place] in 1 year. Her repeat thyroid US from 11/2020 shows that all nodules have decreased in size and there are no new nodules.  No additional surveillance recommended at this time.    - I advised her  to maintain close follow up with Rakes, Doralee Albino, FNP for primary care needs.  If she continues to have hyperthermia, flushing, night sweats, she may need to reach out to PCP for further guidance as her thyroid hormone is regulated fairly well.    I spent  34  minutes in the care of the patient today including review of labs from Thyroid Function, CMP, and other relevant labs ; imaging/biopsy records (current and previous including abstractions from other facilities); face-to-face time discussing  her lab results and symptoms, medications doses, her options of short and long term treatment based on the latest standards of care / guidelines;   and documenting the encounter.  [name]  participated in the  discussions, expressed understanding,  and voiced agreement with the above plans.  All questions were answered to her satisfaction. she is encouraged to contact clinic should she have any questions or concerns prior to her return visit.  Follow up plan: Return in about 3 months (around 04/27/2023) for Thyroid follow up, Previsit labs.   Ronny Bacon, [facility] [address] [place], [place] [address] Phone: [phone] Fax: [phone]   01/26/2023, 11:39 AM

## 2023-02-04 ENCOUNTER — Telehealth: Payer: Self-pay | Admitting: Family Medicine

## 2023-02-04 NOTE — Telephone Encounter (Signed)
  Prescription Request  02/04/2023  What is the name of the medication or equipment? SUCRALFATE  Have you contacted your pharmacy to request a refill? YES  Which pharmacy would you like this sent to? [facility]  Pt says she last filled this medicine in Feb 2024 and bottle says 0 refills. Per medication list, pt should have refills.

## 2023-02-05 MED ORDER — SUCRALFATE 1 G PO TABS: 1.0000 g | ORAL_TABLET | Freq: Two times a day (BID) | ORAL | 0 refills | Status: DC

## 2023-02-05 NOTE — Telephone Encounter (Signed)
Pt aware refill sent to pharmacy 

## 2023-02-15 ENCOUNTER — Telehealth: Payer: Self-pay | Admitting: Family Medicine

## 2023-02-15 DIAGNOSIS — E119 Type 2 diabetes mellitus without complications: Secondary | ICD-10-CM

## 2023-02-15 MED ORDER — METFORMIN HCL 500 MG PO TABS: 500.0000 mg | ORAL_TABLET | Freq: Two times a day (BID) | ORAL | 0 refills | Status: DC

## 2023-02-15 NOTE — Telephone Encounter (Signed)
LMOVM refill sent to pharmacy 

## 2023-02-15 NOTE — Telephone Encounter (Signed)
  Prescription Request  02/15/2023  Is this a "Controlled Substance" medicine?   Have you seen your PCP in the last 2 weeks? NOV 5/22  If YES, route message to pool  -  If NO, patient needs to be scheduled for appointment.  What is the name of the medication or equipment? metFORMIN (GLUCOPHAGE) 500 MG tablet   Have you contacted your pharmacy to request a refill?  yes  Which pharmacy would you like this sent to?  [facility] - [place], [place] - [address]    Patient notified that their request is being sent to the clinical staff for review and that they should receive a response within 2 business days.

## 2023-02-22 DIAGNOSIS — H04123 Dry eye syndrome of bilateral lacrimal glands: Secondary | ICD-10-CM | POA: Diagnosis not present

## 2023-02-22 DIAGNOSIS — H401131 Primary open-angle glaucoma, bilateral, mild stage: Secondary | ICD-10-CM | POA: Diagnosis not present

## 2023-02-22 DIAGNOSIS — H43812 Vitreous degeneration, left eye: Secondary | ICD-10-CM | POA: Diagnosis not present

## 2023-02-22 LAB — HM DIABETES EYE EXAM

## 2023-03-10 ENCOUNTER — Encounter: Payer: Self-pay | Admitting: Family Medicine

## 2023-03-10 ENCOUNTER — Ambulatory Visit (INDEPENDENT_AMBULATORY_CARE_PROVIDER_SITE_OTHER): Payer: Medicare Other | Admitting: Family Medicine

## 2023-03-10 VITALS — BP 127/66 | HR 85 | Temp 97.0°F | Ht 61.0 in | Wt 146.6 lb

## 2023-03-10 DIAGNOSIS — R413 Other amnesia: Secondary | ICD-10-CM | POA: Diagnosis not present

## 2023-03-10 DIAGNOSIS — I152 Hypertension secondary to endocrine disorders: Secondary | ICD-10-CM

## 2023-03-10 DIAGNOSIS — E559 Vitamin D deficiency, unspecified: Secondary | ICD-10-CM

## 2023-03-10 DIAGNOSIS — R202 Paresthesia of skin: Secondary | ICD-10-CM

## 2023-03-10 DIAGNOSIS — E1159 Type 2 diabetes mellitus with other circulatory complications: Secondary | ICD-10-CM

## 2023-03-10 DIAGNOSIS — E119 Type 2 diabetes mellitus without complications: Secondary | ICD-10-CM | POA: Insufficient documentation

## 2023-03-10 DIAGNOSIS — E118 Type 2 diabetes mellitus with unspecified complications: Secondary | ICD-10-CM

## 2023-03-10 DIAGNOSIS — Z7984 Long term (current) use of oral hypoglycemic drugs: Secondary | ICD-10-CM | POA: Diagnosis not present

## 2023-03-10 LAB — CBC WITH DIFFERENTIAL/PLATELET
Basophils Absolute: 0 10*3/uL (ref 0.0–0.2)
Immature Grans (Abs): 0 10*3/uL (ref 0.0–0.1)
Lymphocytes Absolute: 1.5 10*3/uL (ref 0.7–3.1)
Lymphs: 27 %
Monocytes Absolute: 0.3 10*3/uL (ref 0.1–0.9)
Monocytes: 5 %
Neutrophils: 65 %

## 2023-03-10 LAB — LIPID PANEL

## 2023-03-10 LAB — CMP14+EGFR

## 2023-03-10 LAB — VITAMIN B12

## 2023-03-10 LAB — VITAMIN D 25 HYDROXY (VIT D DEFICIENCY, FRACTURES)

## 2023-03-10 LAB — BAYER DCA HB A1C WAIVED: HB A1C (BAYER DCA - WAIVED): 5.7 % — ABNORMAL HIGH (ref 4.8–5.6)

## 2023-03-10 NOTE — Progress Notes (Signed)
Subjective:  Patient ID: Andrea Shannon, female    DOB: 1946/04/23, 77 y.o.   MRN: [id number]  Patient Care Team: Sonny Masters, FNP as PCP - General (Family Medicine) Babs Bertin, OD (Optometry) Dani Gobble, NP as Nurse Practitioner (Nurse Practitioner) Adam Phenix, DPM as Consulting Physician (Podiatry)   Chief Complaint:  Diabetes (3 month follow up )   HPI: Andrea Shannon is a 77 y.o. female presenting on 03/10/2023 for Diabetes (3 month follow up )    1. Hypertension associated with diabetes (HCC) Complaint with meds - Yes Current Medications - Zestoretic Checking BP at home ranging 130/80 Exercising Regularly - Yes Watching Salt intake - Yes Pertinent ROS:  Headache - No Fatigue - No Visual Disturbances - No Chest pain - No Dyspnea - No Palpitations - No LE edema - No They report good compliance with medications and can restate their regimen by memory. No medication side effects.  BP Readings from Last 3 Encounters:  03/10/23 127/66  01/26/23 104/65  12/18/22 135/74     2. Diabetes mellitus type 2 with complications (HCC) 3. Paresthesias Taking metformin as prescribed and tolerating well. Denies polyuria, polyphagia, or polydipsia. No visual changes. Does report tingling / numbness to arms and feet at times. No loss of function or injuries reported.    4. Vitamin D deficiency Pt is taking oral repletion therapy. Denies bone pain and tenderness, muscle weakness, fracture, and difficulty walking. Lab Results  Component Value Date   VD25OH 65.9 03/13/2021   Lab Results  Component Value Date   CALCIUM 10.2 12/02/2022      5. Memory loss States she has been having issues with forgetting things at times. Not new or worsening but does feel she would like to have this evaluated. She states she will walk into a room to get something and forget what she was going to get. States she will remember a few minutes later.      Relevant past  medical, surgical, family, and social history reviewed and updated as indicated.  Allergies and medications reviewed and updated. Data reviewed: Chart in Epic.   Past Medical History:  Diagnosis Date   Acid reflux    Arthritis    Diabetes mellitus, type II (HCC)    Hypertension    Hyperthyroidism    Hypothyroidism     Past Surgical History:  Procedure Laterality Date   ABDOMINAL HYSTERECTOMY      Social History   Socioeconomic History   Marital status: Widowed    Spouse name: Not on file   Number of children: 1   Years of education: 12   Highest education level: High school graduate  Occupational History   Not on file  Tobacco Use   Smoking status: Never   Smokeless tobacco: Never  Vaping Use   Vaping Use: Never used  Substance and Sexual Activity   Alcohol use: Not Currently   Drug use: Never   Sexual activity: Not Currently    Birth control/protection: Surgical  Other Topics Concern   Not on file  Social History Narrative   Not on file   Social Determinants of Health   Financial Resource Strain: Low Risk  (01/06/2023)   Overall Financial Resource Strain (CARDIA)    Difficulty of Paying Living Expenses: Not hard at all  Food Insecurity: No Food Insecurity (01/06/2023)   Hunger Vital Sign    Worried About Running Out of Food in the Last Year: Never true  Ran Out of Food in the Last Year: Never true  Transportation Needs: No Transportation Needs (01/06/2023)   PRAPARE - Administrator, Civil Service (Medical): No    Lack of Transportation (Non-Medical): No  Physical Activity: Insufficiently Active (01/06/2023)   Exercise Vital Sign    Days of Exercise per Week: 3 days    Minutes of Exercise per Session: 30 min  Stress: No Stress Concern Present (01/06/2023)   [employer] of Occupational Health - Occupational Stress Questionnaire    Feeling of Stress : Not at all  Social Connections: Moderately Integrated (01/06/2023)   Social Connection and  Isolation Panel [NHANES]    Frequency of Communication with Friends and Family: More than three times a week    Frequency of Social Gatherings with Friends and Family: More than three times a week    Attends Religious Services: More than 4 times per year    Active Member of [employer] or Organizations: Yes    Attends Banker Meetings: More than 4 times per year    Marital Status: Widowed  Intimate Partner Violence: Not At Risk (01/06/2023)   Humiliation, Afraid, Rape, and Kick questionnaire    Fear of Current or Ex-Partner: No    Emotionally Abused: No    Physically Abused: No    Sexually Abused: No    Outpatient Encounter Medications as of 03/10/2023  Medication Sig   aspirin 81 MG chewable tablet Chew by mouth daily.   diclofenac (VOLTAREN) 75 MG EC tablet Take 1 tablet (75 mg total) by mouth 2 (two) times daily.   levothyroxine (SYNTHROID) 75 MCG tablet Take 1 tablet (75 mcg total) by mouth daily before breakfast.   lisinopril-hydrochlorothiazide (ZESTORETIC) 20-12.5 MG tablet Take 1 tablet by mouth daily.   metFORMIN (GLUCOPHAGE) 500 MG tablet Take 1 tablet (500 mg total) by mouth 2 (two) times daily with a meal. TAKE 1 TABLET BY MOUTH TWICE A DAY WITH A MEAL   pantoprazole (PROTONIX) 40 MG tablet Take 1 tablet (40 mg total) by mouth daily.   sucralfate (CARAFATE) 1 g tablet Take 1 tablet (1 g total) by mouth 2 (two) times daily.   No facility-administered encounter medications on file as of 03/10/2023.    No Known Allergies  Review of Systems  Constitutional:  Negative for activity change, appetite change, chills, diaphoresis, fatigue, fever and unexpected weight change.  HENT: Negative.    Eyes: Negative.  Negative for photophobia and visual disturbance.  Respiratory:  Negative for cough, chest tightness and shortness of breath.   Cardiovascular:  Negative for chest pain, palpitations and leg swelling.  Gastrointestinal:  Negative for abdominal pain, blood in stool,  constipation, diarrhea, nausea and vomiting.  Endocrine: Negative.  Negative for polydipsia, polyphagia and polyuria.  Genitourinary:  Negative for decreased urine volume, difficulty urinating, dysuria, frequency and urgency.  Musculoskeletal:  Positive for arthralgias. Negative for myalgias.  Skin: Negative.   Allergic/Immunologic: Negative.   Neurological:  Positive for numbness (arms and feet at times). Negative for dizziness, weakness and headaches.       Forgetfulness at times  Hematological: Negative.   Psychiatric/Behavioral:  Negative for confusion, hallucinations, sleep disturbance and suicidal ideas.   All other systems reviewed and are negative.       Objective:  BP 127/66   Pulse 85   Temp (!) 97 F (36.1 C) (Temporal)   Ht 5\' 1"  (1.549 m)   Wt 146 lb 9.6 oz (66.5 kg)   SpO2 97%  BMI 27.70 kg/m    Wt Readings from Last 3 Encounters:  03/10/23 146 lb 9.6 oz (66.5 kg)  01/26/23 145 lb 3.2 oz (65.9 kg)  01/06/23 147 lb (66.7 kg)    Physical Exam Vitals and nursing note reviewed.  Constitutional:      General: She is not in acute distress.    Appearance: Normal appearance. She is well-developed and well-groomed. She is not ill-appearing, toxic-appearing or diaphoretic.  HENT:     Head: Normocephalic and atraumatic.     Jaw: There is normal jaw occlusion.     Right Ear: Hearing normal.     Left Ear: Hearing normal.     Nose: Nose normal.     Mouth/Throat:     Lips: Pink.     Mouth: Mucous membranes are moist.     Pharynx: Oropharynx is clear. Uvula midline.  Eyes:     General: Lids are normal.     Extraocular Movements: Extraocular movements intact.     Conjunctiva/sclera: Conjunctivae normal.     Pupils: Pupils are equal, round, and reactive to light.  Neck:     Thyroid: No thyroid mass, thyromegaly or thyroid tenderness.     Vascular: No carotid bruit or JVD.     Trachea: Trachea and phonation normal.  Cardiovascular:     Rate and Rhythm: Normal rate  and regular rhythm.     Chest Wall: PMI is not displaced.     Pulses: Normal pulses.     Heart sounds: Normal heart sounds. No murmur heard.    No friction rub. No gallop.  Pulmonary:     Effort: Pulmonary effort is normal. No respiratory distress.     Breath sounds: Normal breath sounds. No wheezing.  Abdominal:     General: Bowel sounds are normal. There is no distension or abdominal bruit.     Palpations: Abdomen is soft. There is no hepatomegaly or splenomegaly.     Tenderness: There is no abdominal tenderness. There is no right CVA tenderness or left CVA tenderness.     Hernia: No hernia is present.  Musculoskeletal:        General: Normal range of motion.     Cervical back: Normal range of motion and neck supple.     Right lower leg: No edema.     Left lower leg: No edema.  Lymphadenopathy:     Cervical: No cervical adenopathy.  Skin:    General: Skin is warm and dry.     Capillary Refill: Capillary refill takes less than 2 seconds.     Coloration: Skin is not cyanotic, jaundiced or pale.     Findings: No rash.  Neurological:     General: No focal deficit present.     Mental Status: She is alert and oriented to person, place, and time.     Sensory: Sensation is intact.     Motor: Motor function is intact.     Coordination: Coordination is intact.     Gait: Gait is intact.     Deep Tendon Reflexes: Reflexes are normal and symmetric.  Psychiatric:        Attention and Perception: Attention and perception normal.        Mood and Affect: Mood and affect normal.        Speech: Speech normal.        Behavior: Behavior normal. Behavior is cooperative.        Thought Content: Thought content normal.  Cognition and Memory: Cognition and memory normal.        Judgment: Judgment normal.     Results for orders placed or performed in visit on 03/02/23  HM DIABETES EYE EXAM  Result Value Ref Range   HM Diabetic Eye Exam         Pertinent labs & imaging results that  were available during my care of the patient were reviewed by me and considered in my medical decision making.  Assessment & Plan:  Jamiyla was seen today for diabetes.  Diagnoses and all orders for this visit:  Diabetes mellitus type 2 with complications (HCC) Well controled on current regimen, A1C today 5.7. Other labs pending. Continue current medications.  -     CBC with Differential/Platelet -     Bayer DCA Hb A1c Waived -     CMP14+EGFR -     Lipid panel -     Vitamin B12  Hypertension associated with diabetes (HCC) BP well controlled. Changes were not made in regimen today. Goal BP is 130/80. Pt aware to report any persistent high or low readings. DASH diet and exercise encouraged. Exercise at least 150 minutes per week and increase as tolerated. Goal BMI > 25. Stress management encouraged. Avoid nicotine and tobacco product use. Avoid excessive alcohol and NSAID's. Avoid more than 2000 mg of sodium daily. Medications as prescribed. Follow up as scheduled.  -     CBC with Differential/Platelet -     CMP14+EGFR -     Lipid panel  Paresthesias Ongoing to arms and feet. Will check Vit B 12. Referral to neurology placed as pt is complaining of memory loss a times.  -     Vitamin B12 -     Ambulatory referral to Neurology  Vitamin D deficiency Labs pending. Continue repletion therapy. If indicated, will change repletion dosage. Eat foods rich in Vit D including milk, orange juice, yogurt with vitamin D added, salmon or mackerel, canned tuna fish, cereals with vitamin D added, and cod liver oil. Get out in the sun but make sure to wear at least SPF 30 sunscreen.  -     VITAMIN D 25 Hydroxy (Vit-D Deficiency, Fractures)  Memory loss Forgetfulness with return of thought after a few minutes. Would like to see neurology. -     Ambulatory referral to Neurology     Continue all other maintenance medications.  Follow up plan: Return in about 3 months (around 06/10/2023), or if  symptoms worsen or fail to improve, for DM.   Continue healthy lifestyle choices, including diet (rich in fruits, vegetables, and lean proteins, and low in salt and simple carbohydrates) and exercise (at least 30 minutes of moderate physical activity daily).  Educational handout given for DM  The above assessment and management plan was discussed with the patient. The patient verbalized understanding of and has agreed to the management plan. Patient is aware to call the clinic if they develop any new symptoms or if symptoms persist or worsen. Patient is aware when to return to the clinic for a follow-up visit. Patient educated on when it is appropriate to go to the emergency department.   Kari Baars, FNP-C [facility] [phone]

## 2023-03-10 NOTE — Patient Instructions (Addendum)

## 2023-03-11 LAB — LIPID PANEL
Chol/HDL Ratio: 3.5 ratio (ref 0.0–4.4)
Cholesterol, Total: 177 mg/dL (ref 100–199)
HDL: 51 mg/dL (ref >39)
Triglycerides: 298 mg/dL — ABNORMAL HIGH (ref 0–149)
VLDL Cholesterol Cal: 48 mg/dL — ABNORMAL HIGH (ref 5–40)

## 2023-03-11 LAB — CBC WITH DIFFERENTIAL/PLATELET
Basos: 0 %
EOS (ABSOLUTE): 0.1 10*3/uL (ref 0.0–0.4)
Eos: 3 %
Hematocrit: 33.2 % — ABNORMAL LOW (ref 34.0–46.6)
Hemoglobin: 10.4 g/dL — ABNORMAL LOW (ref 11.1–15.9)
Immature Granulocytes: 0 %
MCH: 24.1 pg — ABNORMAL LOW (ref 26.6–33.0)
MCHC: 31.3 g/dL — ABNORMAL LOW (ref 31.5–35.7)
MCV: 77 fL — ABNORMAL LOW (ref 79–97)
Neutrophils Absolute: 3.5 10*3/uL (ref 1.4–7.0)
Platelets: 471 10*3/uL — ABNORMAL HIGH (ref 150–450)
RBC: 4.31 x10E6/uL (ref 3.77–5.28)
RDW: 17.4 % — ABNORMAL HIGH (ref 11.7–15.4)
WBC: 5.3 10*3/uL (ref 3.4–10.8)

## 2023-03-11 LAB — CMP14+EGFR
Albumin/Globulin Ratio: 2.3 — ABNORMAL HIGH (ref 1.2–2.2)
Albumin: 4.5 g/dL (ref 3.8–4.8)
Alkaline Phosphatase: 74 IU/L (ref 44–121)
BUN: 15 mg/dL (ref 8–27)
Bilirubin Total: <0.2 mg/dL (ref 0.0–1.2)
CO2: 18 mmol/L — ABNORMAL LOW (ref 20–29)
Calcium: 9.8 mg/dL (ref 8.7–10.3)
Glucose: 97 mg/dL (ref 70–99)
Total Protein: 6.5 g/dL (ref 6.0–8.5)

## 2023-03-12 ENCOUNTER — Encounter: Payer: Self-pay | Admitting: Physician Assistant

## 2023-03-16 NOTE — Progress Notes (Signed)
Patient r/ c °

## 2023-03-29 ENCOUNTER — Ambulatory Visit
Admission: RE | Admit: 2023-03-29 | Discharge: 2023-03-29 | Disposition: A | Discharge status: Home / Self Care | Payer: Medicare Other | Source: Ambulatory Visit | Attending: Family Medicine | Admitting: Family Medicine

## 2023-03-29 DIAGNOSIS — Z1231 Encounter for screening mammogram for malignant neoplasm of breast: Secondary | ICD-10-CM | POA: Diagnosis not present

## 2023-04-06 ENCOUNTER — Other Ambulatory Visit: Payer: Self-pay | Admitting: *Deleted

## 2023-04-06 ENCOUNTER — Telehealth: Payer: Self-pay | Admitting: Family Medicine

## 2023-04-06 DIAGNOSIS — E0520 Thyrotoxicosis with toxic multinodular goiter without thyrotoxic crisis or storm: Secondary | ICD-10-CM

## 2023-04-06 MED ORDER — LEVOTHYROXINE SODIUM 75 MCG PO TABS
75.0000 ug | ORAL_TABLET | Freq: Every day | ORAL | 1 refills | Status: DC
Start: 2023-04-06 — End: 2023-05-06

## 2023-04-06 MED ORDER — SUCRALFATE 1 G PO TABS: 1.0000 g | ORAL_TABLET | Freq: Two times a day (BID) | ORAL | 0 refills | Status: DC

## 2023-04-06 NOTE — Telephone Encounter (Signed)
  Prescription Request  04/06/2023  Is this a "Controlled Substance" medicine?   Have you seen your PCP in the last 2 weeks? LOV 5/22 NOV 8/22  If YES, route message to pool  -  If NO, patient needs to be scheduled for appointment.  What is the name of the medication or equipment?  sucralfate (CARAFATE) 1 g tablet  Have you contacted your pharmacy to request a refill? yes   Which pharmacy would you like this sent to?  [facility] - [place], [place] - [address]     Patient notified that their request is being sent to the clinical staff for review and that they should receive a response within 2 business days.

## 2023-04-06 NOTE — Telephone Encounter (Signed)
Patient's Levothyroxine 75 mcg was sent to the [facility] as she requested. Patient was called and made aware.

## 2023-04-06 NOTE — Telephone Encounter (Signed)
LMOVM refill sent to pharmacy 

## 2023-04-29 ENCOUNTER — Other Ambulatory Visit (HOSPITAL_COMMUNITY)
Admission: RE | Admit: 2023-04-29 | Discharge: 2023-04-29 | Disposition: A | Discharge status: Home / Self Care | Payer: Medicare Other | Source: Ambulatory Visit | Attending: Nurse Practitioner | Admitting: Nurse Practitioner

## 2023-04-29 DIAGNOSIS — E890 Postprocedural hypothyroidism: Secondary | ICD-10-CM | POA: Insufficient documentation

## 2023-04-29 LAB — T4, FREE: Free T4: 1.24 ng/dL — ABNORMAL HIGH (ref 0.61–1.12)

## 2023-04-29 LAB — TSH: TSH: 0.327 u[IU]/mL — ABNORMAL LOW (ref 0.350–4.500)

## 2023-05-06 ENCOUNTER — Ambulatory Visit (INDEPENDENT_AMBULATORY_CARE_PROVIDER_SITE_OTHER): Payer: Medicare Other | Admitting: Nurse Practitioner

## 2023-05-06 ENCOUNTER — Encounter: Payer: Self-pay | Admitting: Nurse Practitioner

## 2023-05-06 VITALS — BP 109/61 | HR 82 | Ht 61.0 in | Wt 146.4 lb

## 2023-05-06 DIAGNOSIS — E559 Vitamin D deficiency, unspecified: Secondary | ICD-10-CM | POA: Diagnosis not present

## 2023-05-06 DIAGNOSIS — E890 Postprocedural hypothyroidism: Secondary | ICD-10-CM | POA: Diagnosis not present

## 2023-05-06 DIAGNOSIS — E0520 Thyrotoxicosis with toxic multinodular goiter without thyrotoxic crisis or storm: Secondary | ICD-10-CM | POA: Diagnosis not present

## 2023-05-06 MED ORDER — LEVOTHYROXINE SODIUM 125 MCG PO TABS
62.5000 ug | ORAL_TABLET | Freq: Every day | ORAL | 1 refills | Status: DC
Start: 2023-05-06 — End: 2023-06-02

## 2023-05-06 MED ORDER — LEVOTHYROXINE SODIUM 125 MCG PO TABS
62.5000 ug | ORAL_TABLET | Freq: Every day | ORAL | 0 refills | Status: DC
Start: 2023-05-06 — End: 2023-05-06

## 2023-05-06 NOTE — Patient Instructions (Signed)

## 2023-05-06 NOTE — Progress Notes (Signed)
05/06/2023, 10:27 AM     Endocrinology follow-up note    Subjective:    Patient ID: Andrea Shannon, female    DOB: [birth year], PCP Rakes, Doralee Albino, FNP   Past Medical History:  Diagnosis Date   Acid reflux    Arthritis    Diabetes mellitus, type II (HCC)    Hypertension    Hyperthyroidism    Hypothyroidism    Past Surgical History:  Procedure Laterality Date   ABDOMINAL HYSTERECTOMY     Social History   Socioeconomic History   Marital status: Widowed    Spouse name: Not on file   Number of children: 1   Years of education: 12   Highest education level: High school graduate  Occupational History   Not on file  Tobacco Use   Smoking status: Never   Smokeless tobacco: Never  Vaping Use   Vaping status: Never Used  Substance and Sexual Activity   Alcohol use: Not Currently   Drug use: Never   Sexual activity: Not Currently    Birth control/protection: Surgical  Other Topics Concern   Not on file  Social History Narrative   Not on file   Social Determinants of Health   Financial Resource Strain: Low Risk  (01/06/2023)   Overall Financial Resource Strain (CARDIA)    Difficulty of Paying Living Expenses: Not hard at all  Food Insecurity: No Food Insecurity (01/06/2023)   Hunger Vital Sign    Worried About Running Out of Food in the Last Year: Never true    Ran Out of Food in the Last Year: Never true  Transportation Needs: No Transportation Needs (01/06/2023)   PRAPARE - Administrator, Civil Service (Medical): No    Lack of Transportation (Non-Medical): No  Physical Activity: Insufficiently Active (01/06/2023)   Exercise Vital Sign    Days of Exercise per Week: 3 days    Minutes of Exercise per Session: 30 min  Stress: No Stress Concern Present (01/06/2023)   [employer] of Occupational Health - Occupational Stress Questionnaire    Feeling of Stress : Not at all  Social Connections:  Moderately Integrated (01/06/2023)   Social Connection and Isolation Panel [NHANES]    Frequency of Communication with Friends and Family: More than three times a week    Frequency of Social Gatherings with Friends and Family: More than three times a week    Attends Religious Services: More than 4 times per year    Active Member of [employer] or Organizations: Yes    Attends Banker Meetings: More than 4 times per year    Marital Status: Widowed   Outpatient Encounter Medications as of 05/06/2023  Medication Sig   aspirin 81 MG chewable tablet Chew by mouth daily.   diclofenac (VOLTAREN) 75 MG EC tablet Take 1 tablet (75 mg total) by mouth 2 (two) times daily.   lisinopril-hydrochlorothiazide (ZESTORETIC) 20-12.5 MG tablet Take 1 tablet by mouth daily.   metFORMIN (GLUCOPHAGE) 500 MG tablet Take 1 tablet (500 mg total) by mouth 2 (two) times daily with a meal. TAKE 1 TABLET BY MOUTH TWICE A DAY WITH A MEAL   pantoprazole (PROTONIX) 40 MG tablet Take 1 tablet (40  mg total) by mouth daily.   sucralfate (CARAFATE) 1 g tablet Take 1 tablet (1 g total) by mouth 2 (two) times daily.   [DISCONTINUED] levothyroxine (SYNTHROID) 75 MCG tablet Take 1 tablet (75 mcg total) by mouth daily before breakfast.   levothyroxine (SYNTHROID) 125 MCG tablet Take 0.5 tablets (62.5 mcg total) by mouth daily before breakfast.   [DISCONTINUED] levothyroxine (SYNTHROID) 125 MCG tablet Take 0.5 tablets (62.5 mcg total) by mouth daily before breakfast.   No facility-administered encounter medications on file as of 05/06/2023.   ALLERGIES: No Known Allergies  VACCINATION STATUS:  There is no immunization history on file for this patient.  Thyroid Problem Presents for follow-up (-She was treated with I-131 on September 29, 2019 for toxic multinodular goiter.   She reports family history of thyroid dysfunction, family history of thyroid malignancy.) visit. Symptoms include heat intolerance. Patient reports no  anxiety, cold intolerance, constipation, depressed mood, diarrhea, fatigue, leg swelling, palpitations, tremors, weight gain or weight loss. (Says recently she has been excessively sweating, hot flashes, even without doing anything to exert such.  She notes night sweats as well.  She did tell me she is trying to use up her supply of 50 mcg pills of her Levothyroxine, so has been splitting tablets to equal the dose she needs and when it splits, it does not split evenly.) The symptoms have been stable.     Andrea Shannon is 77 y.o. female who presents today with a medical history as above.  Review of systems  Constitutional: + stable body weight  current Body mass index is 27.66 kg/m. , no fatigue, + subjective hyperthermia-intermittent but worsening recently, no subjective hypothermia Eyes: no blurry vision, no xerophthalmia ENT: no sore throat, no nodules palpated in throat, no dysphagia/odynophagia, no hoarseness Cardiovascular: no chest pain, no shortness of breath, + intermittent palpitations, no leg swelling Respiratory: no cough, no shortness of breath Gastrointestinal: no nausea/vomiting/diarrhea Musculoskeletal: no muscle/joint aches Skin: no rashes, no hyperemia Neurological: no tremors, no numbness, no tingling, no dizziness Psychiatric: no depression, no anxiety  Objective:    BP 109/61 (BP Location: Left Arm, Patient Position: Sitting, Cuff Size: Normal)   Pulse 82   Ht 5\' 1"  (1.549 m)   Wt 146 lb 6.4 oz (66.4 kg)   BMI 27.66 kg/m   Wt Readings from Last 3 Encounters:  05/06/23 146 lb 6.4 oz (66.4 kg)  03/10/23 146 lb 9.6 oz (66.5 kg)  01/26/23 145 lb 3.2 oz (65.9 kg)     BP Readings from Last 3 Encounters:  05/06/23 109/61  03/10/23 127/66  01/26/23 104/65     Physical Exam- Limited  Constitutional:  Body mass index is 27.66 kg/m. , not in acute distress, normal state of mind Eyes:  EOMI, no exophthalmos Musculoskeletal: no gross deformities, strength intact  in all four extremities, no gross restriction of joint movements Skin:  no rashes, no hyperemia Neurological: no tremor with outstretched hands   Recent Results (from the past 2160 hour(s))  HM DIABETES EYE EXAM     Status: None   Collection Time: 02/22/23  4:23 PM  Result Value Ref Range   HM Diabetic Eye Exam      Comment: Unable to determine - Abstracted by HIM  Bayer DCA Hb A1c Waived     Status: Abnormal   Collection Time: 03/10/23 10:46 AM  Result Value Ref Range   HB A1C (BAYER DCA - WAIVED) 5.7 (H) 4.8 - 5.6 %    Comment:  Prediabetes: 5.7 - 6.4          Diabetes: >6.4          Glycemic control for adults with diabetes: <7.0   CBC with Differential/Platelet     Status: Abnormal   Collection Time: 03/10/23 10:48 AM  Result Value Ref Range   WBC 5.3 3.4 - 10.8 x10E3/uL   RBC 4.31 3.77 - 5.28 x10E6/uL   Hemoglobin 10.4 (L) 11.1 - 15.9 g/dL   Hematocrit 14.7 (L) 82.9 - 46.6 %   MCV 77 (L) 79 - 97 fL   MCH 24.1 (L) 26.6 - 33.0 pg   MCHC 31.3 (L) 31.5 - 35.7 g/dL   RDW 56.2 (H) 13.0 - 86.5 %   Platelets 471 (H) 150 - 450 x10E3/uL   Neutrophils 65 Not Estab. %   Lymphs 27 Not Estab. %   Monocytes 5 Not Estab. %   Eos 3 Not Estab. %   Basos 0 Not Estab. %   Neutrophils Absolute 3.5 1.4 - 7.0 x10E3/uL   Lymphocytes Absolute 1.5 0.7 - 3.1 x10E3/uL   Monocytes Absolute 0.3 0.1 - 0.9 x10E3/uL   EOS (ABSOLUTE) 0.1 0.0 - 0.4 x10E3/uL   Basophils Absolute 0.0 0.0 - 0.2 x10E3/uL   Immature Granulocytes 0 Not Estab. %   Immature Grans (Abs) 0.0 0.0 - 0.1 x10E3/uL  CMP14+EGFR     Status: Abnormal   Collection Time: 03/10/23 10:48 AM  Result Value Ref Range   Glucose 97 70 - 99 mg/dL   BUN 15 8 - 27 mg/dL   Creatinine, Ser 7.84 (H) 0.57 - 1.00 mg/dL   eGFR 49 (L) >69 GE/XBM/8.41   BUN/Creatinine Ratio 13 12 - 28   Sodium 140 134 - 144 mmol/L   Potassium 4.6 3.5 - 5.2 mmol/L   Chloride 101 96 - 106 mmol/L   CO2 18 (L) 20 - 29 mmol/L   Calcium 9.8 8.7 - 10.3 mg/dL    Total Protein 6.5 6.0 - 8.5 g/dL   Albumin 4.5 3.8 - 4.8 g/dL   Globulin, Total 2.0 1.5 - 4.5 g/dL   Albumin/Globulin Ratio 2.3 (H) 1.2 - 2.2   Bilirubin Total <0.2 0.0 - 1.2 mg/dL   Alkaline Phosphatase 74 44 - 121 IU/L   AST 15 0 - 40 IU/L   ALT 16 0 - 32 IU/L  Lipid panel     Status: Abnormal   Collection Time: 03/10/23 10:48 AM  Result Value Ref Range   Cholesterol, Total 177 100 - 199 mg/dL   Triglycerides 324 (H) 0 - 149 mg/dL   HDL 51 >40 mg/dL   VLDL Cholesterol Cal 48 (H) 5 - 40 mg/dL   LDL Chol Calc (NIH) 78 0 - 99 mg/dL   Chol/HDL Ratio 3.5 0.0 - 4.4 ratio    Comment:                                   T. Chol/HDL Ratio                                             Men  Women                               1/2 Avg.Risk  3.4    3.3                                   Avg.Risk  5.0    4.4                                2X Avg.Risk  9.6    7.1                                3X Avg.Risk 23.4   11.0   Vitamin B12     Status: None   Collection Time: 03/10/23 10:48 AM  Result Value Ref Range   Vitamin B-12 710 232 - 1,245 pg/mL  VITAMIN D 25 Hydroxy (Vit-D Deficiency, Fractures)     Status: None   Collection Time: 03/10/23 10:48 AM  Result Value Ref Range   Vit D, 25-Hydroxy 51.2 30.0 - 100.0 ng/mL    Comment: Vitamin D deficiency has been defined by the Institute of Medicine and an Endocrine Society practice guideline as a level of serum 25-OH vitamin D less than 20 ng/mL (1,2). The Endocrine Society went on to further define vitamin D insufficiency as a level between 21 and 29 ng/mL (2). 1. IOM (Institute of Medicine). 2010. Dietary reference    intakes for calcium and D. [place]: The    [employer]. 2. Holick MF, Binkley Wray, Bischoff-Ferrari HA, et al.    Evaluation, treatment, and prevention of vitamin D    deficiency: an Endocrine Society clinical practice    guideline. JCEM. 2011 Jul; 96(7):1911-30.   TSH     Status: Abnormal   Collection Time:  04/29/23 12:54 PM  Result Value Ref Range   TSH 0.327 (L) 0.350 - 4.500 uIU/mL    Comment: Performed by a 3rd Generation assay with a functional sensitivity of <=0.01 uIU/mL. Performed at [facility], [address], [place], [place] [address]   T4, free     Status: Abnormal   Collection Time: 04/29/23 12:54 PM  Result Value Ref Range   Free T4 1.24 (H) 0.61 - 1.12 ng/dL    Comment: (NOTE) Biotin ingestion may interfere with free T4 tests. If the results are inconsistent with the TSH level, previous test results, or the clinical presentation, then consider biotin interference. If needed, order repeat testing after stopping biotin. Performed at [facility], [address], [place], [place] [address]    Ultrasound of the thyroid on June 27, 2018 showed right lobe measuring 4.9 x 1.9 x 2.1 cm with 1 dominant mixed cystic and solid nodule measuring 2.2 x 1.5 x 1.8 cm.  Another nodule in the superior right lobe measuring 1.9 x 0.9 x 1.2 cm. Left lobe heterogeneous measuring 4.7 x 3.2 x 2.3 cm with dominant mixed cystic and solid nodule in the central left lobe measuring 2.6 x 2.4 x 2.3 cm.  Separate mixed cystic and solid nodule in the superior left lobe measuring 1.7 x 1.3 x 1.4 cm. Impression:  innumerable thyroid nodules, increasing probability of benignity.  2 suspicious right thyroid nodule measuring 1.9 x 0.9 x 1.2 cm in the superior right lobe.  Ultrasound-guided FNA is suggested.  More suspicious left thyroid nodule in the superior left lobe measuring 2.6 x 2.4 x 2.3 cm.  Ultrasound-guided  FNA suggested.   Fine-needle aspiration on November 30, 2018 revealed atypia of undetermined significance,  afirma testing-reported benign, with malignancy risk less than 4%.     Thyroid uptake and scan on September 04, 2019 showed enlarged thyroid gland with several focal areas of increased uptake, likely representing nodules.  No areas of photopenia.  24-hour uptake 35%.     US  Thyroid 11/15/2018 Narrative & Impression  CLINICAL DATA:  Multinodular goiter   EXAM: THYROID ULTRASOUND   TECHNIQUE: Ultrasound examination of the thyroid gland and adjacent soft tissues was performed.   COMPARISON:  06/27/2018 by report only from [facility]   FINDINGS: Parenchymal Echotexture: Moderately heterogenous   Isthmus: 0.3 cm thickness, stable   Right lobe: 5.6 x 2.3 x 2 cm, previously 4.9 x 1.9 x 2.1   Left lobe: 6.1 x 3.3 x 2.3 cm, previously 4.7 x 3.2 x 2.3   _________________________________________________________   Estimated total number of nodules >/= 1 cm: 6-10   Number of spongiform nodules >/=  2 cm not described below (TR1): 0   Number of mixed cystic and solid nodules >/= 1.5 cm not described below (TR2): 0   _________________________________________________________   Nodule # 1:   Location: Right; Superior   Maximum size: 1.4 cm; Other 2 dimensions: 1.1 x 1 cm   Composition: mixed cystic and solid (1)   Echogenicity: hypoechoic (2)   Shape: not taller-than-wide (0)   Margins: ill-defined (0)   Echogenic foci: none (0)   ACR TI-RADS total points: 3.   ACR TI-RADS risk category: TR3 (3 points).   ACR TI-RADS recommendations:   Given size (<1.4 cm) and appearance, this nodule does NOT meet TI-RADS criteria for biopsy or dedicated follow-up.   _________________________________________________________   Nodule # 2: 2.7 x 1.9 x 2 cm spongiform nodule, mid right; This nodule does NOT meet TI-RADS criteria for biopsy or dedicated follow-up.   Nodule # 3: 1.5 x 0.9 x 1.2 cm spongiform nodule, inferior right; This nodule does NOT meet TI-RADS criteria for biopsy or dedicated follow-up.   Nodule # 4:   Location: Left; Superior   Maximum size: 2 cm; Other 2 dimensions: 1.7 x 1.7 cm   Composition: mixed cystic and solid (1)   Echogenicity: hypoechoic (2)   Shape: not taller-than-wide (0)   Margins: smooth (0)    Echogenic foci: none (0)   ACR TI-RADS total points: 3.   ACR TI-RADS risk category: TR3 (3 points).   ACR TI-RADS recommendations:   *Given size (>/= 1.5 - 2.4 cm) and appearance, a follow-up ultrasound in 1 year should be considered based on TI-RADS criteria.   _________________________________________________________   Nodule # 5:   Location: Left; Mid   Maximum size: 3 cm; Other 2 dimensions: 2.8 x 1.9 cm   Composition: mixed cystic and solid (1)   Echogenicity: isoechoic (1)   Shape: not taller-than-wide (0)   Margins: smooth (0)   Echogenic foci: none (0)   ACR TI-RADS total points: 2.   ACR TI-RADS risk category: TR2 (2 points).   ACR TI-RADS recommendations:   This nodule does NOT meet TI-RADS criteria for biopsy or dedicated follow-up.   _________________________________________________________   Nodule # 6:   Location: Left; Inferior   Maximum size: 3.2 cm; Other 2 dimensions: 2.7 x 1.9 cm   Composition: mixed cystic and solid (1)   Echogenicity: isoechoic (1)   Shape: not taller-than-wide (0)   Margins: smooth (0)   Echogenic foci: none (  0)   ACR TI-RADS total points: 2.   ACR TI-RADS risk category: TR2 (2 points).   ACR TI-RADS recommendations:   This nodule does NOT meet TI-RADS criteria for biopsy or dedicated follow-up.   _________________________________________________________   Nodule # 7:   Location: Left; Inferior posterior   Maximum size: 3 cm; Other 2 dimensions: 2.9 x 2.3 cm   Composition: solid/almost completely solid (2)   Echogenicity: isoechoic (1)   Shape: taller-than-wide (3)   Margins: ill-defined (0)   Echogenic foci: none (0)   ACR TI-RADS total points: 6.   ACR TI-RADS risk category: TR4 (4-6 points).   ACR TI-RADS recommendations:   **Given size (>/= 1.5 cm) and appearance, fine needle aspiration of this moderately suspicious nodule should be considered based on TI-RADS criteria.    IMPRESSION: 1. Thyromegaly with bilateral nodules. 2. Recommend FNA biopsy of moderately suspicious 3 cm inferior posterior nodule #7; This was not described on the prior study. 3. Recommend annual/biennial ultrasound follow-up of additional lesion as above, until stability x5 years confirmed.   -------------------------------------------------------------------------------------------------------------------------------  FOLLOW UP THYROID US FROM 12/02/20 CLINICAL DATA:  Hypothyroidism. Left inferior thyroid nodule biopsy on 11/30/2018.   EXAM: THYROID ULTRASOUND   TECHNIQUE: Ultrasound examination of the thyroid gland and adjacent soft tissues was performed.   COMPARISON:  11/15/2018   FINDINGS: Parenchymal Echotexture: Moderately heterogenous   Isthmus: 0.2 cm, previously 0.3 cm   Right lobe: 4.9 x 1.7 x 1.6 cm, previously 5.6 x 2.3 x 2.0 cm   Left lobe: 5.1 x 2.2 x 1.7 cm, previously 6.1 x 2.8 x 2.3 cm   _________________________________________________________   Estimated total number of nodules >/= 1 cm: 5   Number of spongiform nodules >/=  2 cm not described below (TR1): 0   Number of mixed cystic and solid nodules >/= 1.5 cm not described below (TR2): 0   _________________________________________________________   Again noted are multiple right thyroid nodules. The right thyroid nodules are predominantly cystic or spongiform.   Nodule 2 in the right mid thyroid lobe has markedly decreased in size. This is a spongiform or mixed cystic and solid nodule that measures 1.3 x 0.9 x 0.9 cm and previously measured 2.7 x 1.9 x 2.0 cm.   Nodule 3 in the right inferior thyroid is mixed cystic and solid nodule that measures roughly 1.5 x 1.0 x 1.2 cm and previously measured 1.3 x 1.2 x 0.9 cm.   Multiple left thyroid nodules that are predominantly mixed cystic and solid nodules.   Nodule 5 in the left mid thyroid lobe measures 1.3 x 0.5 x 0.5 cm and previously  measured 2.8 x 1.9 x 3.0 cm. This is a mixed cystic and solid nodule and the solid portion is isoechoic.   Nodule 6 is a mixed cystic and solid nodule in the left inferior thyroid lobe measures 1.6 x 0.9 x 1.5 cm and previously measured 2.7 x 1.9 x 3.2 cm.   Nodule 7 in the left inferior thyroid lobe represents the previously biopsied nodule. This nodule is slightly hypoechoic and has scattered cystic areas. This nodule measures 2.1 x 2.0 x 1.5 cm and previously measured 3.0 x 2.9 x 2.3 cm.   IMPRESSION: 1. Multinodular goiter. 2. Most of the nodules are mixed cystic and solid composition. Many of the nodules have decreased in size as described. 3. No new suspicious thyroid nodules.   The above is in keeping with the ACR TI-RADS recommendations - J Am Coll Radiol 2017;14:587-595.  Electronically Signed   By: Richarda Overlie M.D.   On: 12/02/2020 14:20   Latest Reference Range & Units 09/02/22 12:15 12/02/22 10:07 01/13/23 11:46 01/13/23 11:56 04/29/23 12:54  TSH 0.350 - 4.500 uIU/mL 0.152 (L) 0.542 1.112  0.327 (L)  T4,Free(Direct) 0.61 - 1.12 ng/dL 4.01 (H)   0.27 2.53 (H)  Thyroxine (T4) 4.5 - 12.0 ug/dL  8.4     Free Thyroxine Index 1.2 - 4.9   2.4     T3 Uptake Ratio 24 - 39 %  29     (L): Data is abnormally low (H): Data is abnormally high  Assessment & Plan:   1. RAI - induced hypothyroidism -She status post I-131 thyroid ablation on September 29, 2019.    -Her previsit TFTs are consistent with slight over-replacement.  She is advised to lower her Levothyroxine to 62.5 mcg po daily before breakfast (1/2 of a 125 mcg tab).  75 mcg is too much, 50 mcg was not enough.   - We discussed about the correct intake of her thyroid hormone, on empty stomach at fasting, with water, separated by at least 30 minutes from breakfast and other medications,  and separated by more than 4 hours from calcium, iron, multivitamins, acid reflux medications (PPIs). -Patient is made aware of the  fact that thyroid hormone replacement is needed for life, dose to be adjusted by periodic monitoring of thyroid function tests.  2.  Multinodular goiter -Her fine-needle aspiration was significant for atypia of undetermined significance, a sample was sent for afirma testing-with grossly benign finding with malignancy is less than 4%.  She will not need surgical intervention at this time.    -Her thyroid US from 10/2018 shows multiple nodules which recommended follow up with [place] in 1 year. Her repeat thyroid US from 11/2020 shows that all nodules have decreased in size and there are no new nodules.  No additional surveillance recommended at this time.    - I advised her  to maintain close follow up with Rakes, Doralee Albino, FNP for primary care needs.  If she continues to have hyperthermia, flushing, night sweats, she may need to reach out to PCP for further guidance as her thyroid hormone is regulated fairly well.    I spent  15  minutes in the care of the patient today including review of labs from Thyroid Function, CMP, and other relevant labs ; imaging/biopsy records (current and previous including abstractions from other facilities); face-to-face time discussing  her lab results and symptoms, medications doses, her options of short and long term treatment based on the latest standards of care / guidelines;   and documenting the encounter.  [name]  participated in the discussions, expressed understanding, and voiced agreement with the above plans.  All questions were answered to her satisfaction. she is encouraged to contact clinic should she have any questions or concerns prior to her return visit.  Follow up plan: Return in about 3 months (around 08/06/2023) for Thyroid follow up, Previsit labs.   Ronny Bacon, [facility] [address] [place], [place] [address] Phone: [phone] Fax: [phone]   05/06/2023, 10:27 AM

## 2023-05-10 ENCOUNTER — Telehealth: Payer: Self-pay | Admitting: Family Medicine

## 2023-05-10 DIAGNOSIS — E119 Type 2 diabetes mellitus without complications: Secondary | ICD-10-CM

## 2023-05-10 MED ORDER — METFORMIN HCL 500 MG PO TABS
500.0000 mg | ORAL_TABLET | Freq: Two times a day (BID) | ORAL | 0 refills | Status: DC
Start: 2023-05-10 — End: 2023-08-17

## 2023-05-10 NOTE — Telephone Encounter (Signed)
Rx sent to pharmacy for patient.

## 2023-05-10 NOTE — Telephone Encounter (Signed)
Pt needs 90 day supply of metFORMIN (GLUCOPHAGE) 500 MG table faxed to [facility] meds by mail

## 2023-05-14 ENCOUNTER — Encounter: Payer: Self-pay | Admitting: Physician Assistant

## 2023-05-14 ENCOUNTER — Ambulatory Visit: Payer: Medicare Other

## 2023-05-14 ENCOUNTER — Other Ambulatory Visit (INDEPENDENT_AMBULATORY_CARE_PROVIDER_SITE_OTHER): Payer: Medicare Other

## 2023-05-14 ENCOUNTER — Ambulatory Visit (INDEPENDENT_AMBULATORY_CARE_PROVIDER_SITE_OTHER): Payer: Medicare Other | Admitting: Physician Assistant

## 2023-05-14 VITALS — BP 127/62 | HR 67 | Resp 18 | Ht 61.0 in | Wt 147.0 lb

## 2023-05-14 DIAGNOSIS — R202 Paresthesia of skin: Secondary | ICD-10-CM | POA: Diagnosis not present

## 2023-05-14 DIAGNOSIS — G629 Polyneuropathy, unspecified: Secondary | ICD-10-CM | POA: Diagnosis not present

## 2023-05-14 DIAGNOSIS — Z114 Encounter for screening for human immunodeficiency virus [HIV]: Secondary | ICD-10-CM

## 2023-05-14 DIAGNOSIS — R739 Hyperglycemia, unspecified: Secondary | ICD-10-CM | POA: Diagnosis not present

## 2023-05-14 DIAGNOSIS — R413 Other amnesia: Secondary | ICD-10-CM

## 2023-05-14 NOTE — Patient Instructions (Addendum)
It was a pleasure to see you today at our office.   Recommendations:   MRI of the brain, the radiology office will call you to arrange you appointment   Check labs today   Ultrasound of the legs for circulation  Nerve conduction study for neuropathy  Follow up Oct 10 at 11:30    For psychiatric meds, mood meds: Please have your primary care physician manage these medications.  If you have any severe symptoms of a stroke, or other severe issues such as confusion,severe chills or fever, etc call 911 or go to the ER as you may need to be evaluated further     For assessment of decision of mental capacity and competency:  Call Dr. Erick Blinks, geriatric psychiatrist at [phone]    Whom to call: Memory  decline, memory medications: Call our office [phone]    [email or web address]   RECOMMENDATIONS FOR ALL PATIENTS WITH MEMORY PROBLEMS: 1. Continue to exercise (Recommend 30 minutes of walking everyday, or 3 hours every week) 2. Increase social interactions - continue going to [place] and enjoy social gatherings with friends and family 3. Eat healthy, avoid fried foods and eat more fruits and vegetables 4. Maintain adequate blood pressure, blood sugar, and blood cholesterol level. Reducing the risk of stroke and cardiovascular disease also helps promoting better memory. 5. Avoid stressful situations. Live a simple life and avoid aggravations. Organize your time and prepare for the next day in anticipation. 6. Sleep well, avoid any interruptions of sleep and avoid any distractions in the bedroom that may interfere with adequate sleep quality 7. Avoid sugar, avoid sweets as there is a strong link between excessive sugar intake, diabetes, and cognitive impairment We discussed the Mediterranean diet, which has been shown to help patients reduce the risk of progressive memory disorders and reduces cardiovascular risk. This includes  eating fish, eat fruits and green leafy vegetables, nuts like almonds and hazelnuts, walnuts, and also use olive oil. Avoid fast foods and fried foods as much as possible. Avoid sweets and sugar as sugar use has been linked to worsening of memory function.  There is always a concern of gradual progression of memory problems. If this is the case, then we may need to adjust level of care according to patient needs. Support, both to the patient and caregiver, should then be put into place.      You have been referred for a neuropsychological evaluation (i.e., evaluation of memory and thinking abilities). Please bring someone with you to this appointment if possible, as it is helpful for the doctor to hear from both you and another adult who knows you well. Please bring eyeglasses and hearing aids if you wear them.    The evaluation will take approximately 3 hours and has two parts:   The first part is a clinical interview with the neuropsychologist (Dr. Milbert Coulter or Dr. Roseanne Reno). During the interview, the neuropsychologist will speak with you and the individual you brought to the appointment.    The second part of the evaluation is testing with the doctor's technician Annabelle Harman or Selena Batten). During the testing, the technician will ask you to remember different types of material, solve problems, and answer some questionnaires. Your family member will not be present for this portion of the evaluation.   Please note: We must reserve several hours of the neuropsychologist's time and the psychometrician's time for your evaluation appointment. As such, there is a No-Show fee of $100. If you are unable to attend any of  your appointments, please contact our office as soon as possible to reschedule.      DRIVING: Regarding driving, in patients with progressive memory problems, driving will be impaired. We advise to have someone else do the driving if trouble finding directions or if minor accidents are reported.  Independent driving assessment is available to determine safety of driving.   If you are interested in the driving assessment, you can contact the following:  [employer] in [place] [phone]  [facility] [phone]  [facility] [phone]  [facility] [phone] or [phone]   FALL PRECAUTIONS: Be cautious when walking. Scan the area for obstacles that may increase the risk of trips and falls. When getting up in the mornings, sit up at the edge of the bed for a few minutes before getting out of bed. Consider elevating the bed at the head end to avoid drop of blood pressure when getting up. Walk always in a well-lit room (use night lights in the walls). Avoid area rugs or power cords from appliances in the middle of the walkways. Use a walker or a cane if necessary and consider physical therapy for balance exercise. Get your eyesight checked regularly.  FINANCIAL OVERSIGHT: Supervision, especially oversight when making financial decisions or transactions is also recommended.  HOME SAFETY: Consider the safety of the kitchen when operating appliances like stoves, microwave oven, and blender. Consider having supervision and share cooking responsibilities until no longer able to participate in those. Accidents with firearms and other hazards in the house should be identified and addressed as well.   ABILITY TO BE LEFT ALONE: If patient is unable to contact 911 operator, consider using LifeLine, or when the need is there, arrange for someone to stay with patients. Smoking is a fire hazard, consider supervision or cessation. Risk of wandering should be assessed by caregiver and if detected at any point, supervision and safe proof recommendations should be instituted.  MEDICATION SUPERVISION: Inability to self-administer medication needs to be constantly addressed. Implement a mechanism to ensure safe administration of the  medications.      Mediterranean Diet A Mediterranean diet refers to food and lifestyle choices that are based on the traditions of countries located on the [employer]. This way of eating has been shown to help prevent certain conditions and improve outcomes for people who have chronic diseases, like kidney disease and heart disease. What are tips for following this plan? Lifestyle  Cook and eat meals together with your family, when possible. Drink enough fluid to keep your urine clear or pale yellow. Be physically active every day. This includes: Aerobic exercise like running or swimming. Leisure activities like gardening, walking, or housework. Get 7-8 hours of sleep each night. If recommended by your health care provider, drink red wine in moderation. This means 1 glass a day for nonpregnant women and 2 glasses a day for men. A glass of wine equals 5 oz (150 mL). Reading food labels  Check the serving size of packaged foods. For foods such as rice and pasta, the serving size refers to the amount of cooked product, not dry. Check the total fat in packaged foods. Avoid foods that have saturated fat or trans fats. Check the ingredients list for added sugars, such as corn syrup. Shopping  At the grocery store, buy most of your food from the areas near the walls of the store. This includes: Fresh fruits and vegetables (produce). Grains, beans, nuts, and seeds. Some of these may be  available in unpackaged forms or large amounts (in bulk). Fresh seafood. Poultry and eggs. Low-fat dairy products. Buy whole ingredients instead of prepackaged foods. Buy fresh fruits and vegetables in-season from local farmers markets. Buy frozen fruits and vegetables in resealable bags. If you do not have access to quality fresh seafood, buy precooked frozen shrimp or canned fish, such as tuna, salmon, or sardines. Buy small amounts of raw or cooked vegetables, salads, or olives from the deli or salad  bar at your store. Stock your pantry so you always have certain foods on hand, such as olive oil, canned tuna, canned tomatoes, rice, pasta, and beans. Cooking  Cook foods with extra-virgin olive oil instead of using butter or other vegetable oils. Have meat as a side dish, and have vegetables or grains as your main dish. This means having meat in small portions or adding small amounts of meat to foods like pasta or stew. Use beans or vegetables instead of meat in common dishes like chili or lasagna. Experiment with different cooking methods. Try roasting or broiling vegetables instead of steaming or sauteing them. Add frozen vegetables to soups, stews, pasta, or rice. Add nuts or seeds for added healthy fat at each meal. You can add these to yogurt, salads, or vegetable dishes. Marinate fish or vegetables using olive oil, lemon juice, garlic, and fresh herbs. Meal planning  Plan to eat 1 vegetarian meal one day each week. Try to work up to 2 vegetarian meals, if possible. Eat seafood 2 or more times a week. Have healthy snacks readily available, such as: Vegetable sticks with hummus. Greek yogurt. Fruit and nut trail mix. Eat balanced meals throughout the week. This includes: Fruit: 2-3 servings a day Vegetables: 4-5 servings a day Low-fat dairy: 2 servings a day Fish, poultry, or lean meat: 1 serving a day Beans and legumes: 2 or more servings a week Nuts and seeds: 1-2 servings a day Whole grains: 6-8 servings a day Extra-virgin olive oil: 3-4 servings a day Limit red meat and sweets to only a few servings a month What are my food choices? Mediterranean diet Recommended Grains: Whole-grain pasta. Brown rice. Bulgar wheat. Polenta. Couscous. Whole-wheat bread. Orpah Cobb. Vegetables: Artichokes. Beets. Broccoli. Cabbage. Carrots. Eggplant. Green beans. Chard. Kale. Spinach. Onions. Leeks. Peas. Squash. Tomatoes. Peppers. Radishes. Fruits: Apples. Apricots. Avocado. Berries.  Bananas. Cherries. Dates. Figs. Grapes. Lemons. Melon. Oranges. Peaches. Plums. Pomegranate. Meats and other protein foods: Beans. Almonds. Sunflower seeds. Pine nuts. Peanuts. Cod. Salmon. Scallops. Shrimp. Tuna. Tilapia. Clams. Oysters. Eggs. Dairy: Low-fat milk. Cheese. Greek yogurt. Beverages: Water. Red wine. Herbal tea. Fats and oils: Extra virgin olive oil. Avocado oil. Grape seed oil. Sweets and desserts: [place] yogurt with honey. Baked apples. Poached pears. Trail mix. Seasoning and other foods: Basil. Cilantro. Coriander. Cumin. Mint. Parsley. Sage. Rosemary. Tarragon. Garlic. Oregano. Thyme. Pepper. Balsalmic vinegar. Tahini. Hummus. Tomato sauce. Olives. Mushrooms. Limit these Grains: Prepackaged pasta or rice dishes. Prepackaged cereal with added sugar. Vegetables: Deep fried potatoes (french fries). Fruits: Fruit canned in syrup. Meats and other protein foods: Beef. Pork. Lamb. Poultry with skin. Hot dogs. Tomasa Blase. Dairy: Ice cream. Sour cream. Whole milk. Beverages: Juice. Sugar-sweetened soft drinks. Beer. Liquor and spirits. Fats and oils: Butter. Canola oil. Vegetable oil. Beef fat (tallow). Lard. Sweets and desserts: Cookies. Cakes. Pies. Candy. Seasoning and other foods: Mayonnaise. Premade sauces and marinades. The items listed may not be a complete list. Talk with your dietitian about what dietary choices are right for you. Summary The  Mediterranean diet includes both food and lifestyle choices. Eat a variety of fresh fruits and vegetables, beans, nuts, seeds, and whole grains. Limit the amount of red meat and sweets that you eat. Talk with your health care provider about whether it is safe for you to drink red wine in moderation. This means 1 glass a day for nonpregnant women and 2 glasses a day for men. A glass of wine equals 5 oz (150 mL). This information is not intended to replace advice given to you by your health care provider. Make sure you discuss any questions you  have with your health care provider.     Labs today [address] [facility] [phone]

## 2023-05-14 NOTE — Progress Notes (Signed)
Assessment/Plan:   Andrea Shannon is a very pleasant 77 y.o. year old RH female with a history of hypertension, hypothyroidism, GERD, arthritis, vitamin D deficiency, iron deficiency anemia DM with neuropathy seen today for evaluation of memory loss. MoCA today is 18/30, suspicious for mild dementia. Workup is in progress.  Patient is able to participate on his IADLs and continues to drive.     Memory Impairment, concerning for dementia   MRI brain without contrast to assess for underlying structural abnormality and assess vascular load   Continue to control mood as per PCP Recommend good control of cardiovascular risk factors Folllow up in 2 months    Bilateral Leg paresthesias and pain   Patient has a history of DM, with possible DM neuropathy. In addition, foot temperature is decreased, and has decreased hair on her extremities, need to rule out a vascular component to symptoms.  Check EMG/NCS  Neuropathy labs:  Hgb A1c, folate, B6, ANA  ESR, CRP, and SPEP//IFE. copper, RF, heavy metal screen (mercury, lead, arsenic), uric acid, HIV, B1 Check ABIs to rule out circulatory involvement  Follow up thyroid disease and iron deficiency deficiency Follow up in 2 months  Pending on the results will entertain medication regimen Recommend good control of cardiovascular risk factors.     Subjective:   The patient is here alone    How long did patient have memory difficulties? "For years need a little time to remember but I do". Patient denies difficulty remembering recent conversations and has more difficulty with  people names but  "I am good at remembering faces" repeats oneself?  Endorsed Disoriented when walking into a room?  Patient denies except occasionally not remembering what patient came to the room for    Leaving objects in unusual places? denies   Wandering behavior?  denies   Any personality changes?   " I have mood swings but not as bad as when in menopause" Any  history of depression?: Endorsed, dating back for "many years, but I know how to get myself out of it"  Hallucinations or paranoia?  Patient denies   Seizures?   Patient denies    Any sleep changes? " I don't  sleep that well". I have vivid dreams, denies REM behavior or sleepwalking   Sleep apnea?  Patient denies   Any hygiene concerns?  Patient denies   Independent of bathing and dressing?  Endorsed  Does the patient needs help with medications? Patient is in charge   Who is in charge of the finances? Patient is in charge     Any changes in appetite? " Not too good because of my GERD" Patient have trouble swallowing? denies   Does the patient cook? Nothing much .   Any kitchen accidents such as leaving the stove on? denies   Any headaches?   denies   Chronic back pain ? denies   Ambulates with difficulty?  denies   Recent falls or head injuries? denies   Vision changes?"I have cataracts'  Unilateral weakness, numbness or tingling?  She has a history of paresthesias in the setting of DM2.  She is not on any medications for this, tried gabapentin but was "too sleepy" with it.  For the last 2 years she reports a burning cold sensation in the feet, sometimes with leg cramps. Feeling is intermittent She is not sure what makes it better or worse. She states that her feet stay cold all day . "Support socks help a little".  Denies imbalance when walking.  The patient has not  noticed any recent skin rashes , fever, night sweats, anorexia or unintentional weight loss  Any tremors?   denies   Any anosmia?  denies   Any incontinence of urine?  She has a history of bladder prolapse.  Any bowel dysfunction? denies      Patient lives alone History of heavy alcohol intake? denies   History of heavy tobacco use? denies   Family history of dementia? denies  Does patient drive? Yes no issues      MEDICATIONS:  Outpatient Encounter Medications as of 05/14/2023  Medication Sig   aspirin 81 MG  chewable tablet Chew by mouth daily.   diclofenac (VOLTAREN) 75 MG EC tablet Take 1 tablet (75 mg total) by mouth 2 (two) times daily.   levothyroxine (SYNTHROID) 125 MCG tablet Take 0.5 tablets (62.5 mcg total) by mouth daily before breakfast.   lisinopril-hydrochlorothiazide (ZESTORETIC) 20-12.5 MG tablet Take 1 tablet by mouth daily.   metFORMIN (GLUCOPHAGE) 500 MG tablet Take 1 tablet (500 mg total) by mouth 2 (two) times daily with a meal. TAKE 1 TABLET BY MOUTH TWICE A DAY WITH A MEAL   pantoprazole (PROTONIX) 40 MG tablet Take 1 tablet (40 mg total) by mouth daily.   sucralfate (CARAFATE) 1 g tablet Take 1 tablet (1 g total) by mouth 2 (two) times daily.   No facility-administered encounter medications on file as of 05/14/2023.    PAST MEDICAL HISTORY: Past Medical History:  Diagnosis Date   Acid reflux    Arthritis    Diabetes mellitus, type II (HCC)    Hypertension    Hyperthyroidism    Hypothyroidism     PAST SURGICAL HISTORY: Past Surgical History:  Procedure Laterality Date   ABDOMINAL HYSTERECTOMY      ALLERGIES: No Known Allergies  FAMILY HISTORY: Family History  Problem Relation Age of Onset   Diabetes Mother    Asthma Father    Heart disease Sister        CHF   Diabetes Sister    Asthma Sister    Arthritis Sister    Diabetes Son    Breast cancer Neg Hx     SOCIAL HISTORY: Social History   Tobacco Use   Smoking status: Never   Smokeless tobacco: Never  Vaping Use   Vaping status: Never Used  Substance Use Topics   Alcohol use: Not Currently   Drug use: Never   Social History   Social History Narrative   Right handed   Drinks caffeine coffee   Lives alone   Retired   One son     OBJECTIVE: PHYSICAL EXAM: BP 127/62   Pulse 67   Resp 18   Ht 5\' 1"  (1.549 m)   Wt 147 lb (66.7 kg)   SpO2 100%   BMI 27.78 kg/m   General:  General appearance: Awake and alert. No distress. Cooperative with exam.  Skin: No obvious rash or  jaundice. HEENT: Atraumatic. Anicteric. Lungs: Non-labored breathing on room air  Heart: Regular Abdomen: Soft, non tender. Extremities: No edema. No obvious deformity.  Musculoskeletal: No obvious joint swelling. Psych: Affect appropriate.  Neurological:  Cranial Nerves: CNII: No RAPD. Visual fields grossly intact. CNIII, IV, VI: PERRL. No nystagmus. EOMI. CN V: Facial sensation intact bilaterally to fine touch. [place] clench strong.   CN VII: Facial muscles symmetric and strong. No ptosis at rest or after sustained upgaze . CN VIII: Hearing grossly intact bilaterally. CN  IX: No hypophonia. CN X: Palate elevates symmetrically. CN XI: Full strength shoulder shrug bilaterally. CN XII: Tongue protrusion full and midline. No atrophy or fasciculations. No  dysarthria  Motor: Tone is normal. No  fasciculations in her lower and lower  extremities. No  atrophy.   Sensation  :  pain numbness tingling on location: at the BLE, worse at her toes ,stabbing.    DTR 2 + B UE and BLE ,Skin cold at her feet B.  Orientation:  Alert and oriented to person, place and time. No aphasia or dysarthria. Fund of knowledge is appropriate. Recent and remote memory impaired  Attention and concentration are reduced.  Able to name objects and repeat phrases. Delayed recall 2/5        Coordination: The patient has no difficulty with RAM's or FNF bilaterally. Normal finger to nose  DTR's: Deep tendon reflexes are 2/4 bilaterally. Gait and Station: The patient is able to ambulate without difficulty.The patient is able to heel toe walk without any difficulty. Gait is cautious and narrow. The patient is able to ambulate in a tandem fashion.         Recent labs July 2024 TSH 0.327 (slightly low), normal vitamin B12 at 710, latest CBC in May 2024 HH 10.4-33.2, MCV 77, platelets 471.  A1c 5.7   Past Medical History:  Diagnosis Date   Acid reflux    Arthritis    Diabetes mellitus, type II (HCC)    Hypertension     Hyperthyroidism    Hypothyroidism      Past Surgical History:  Procedure Laterality Date   ABDOMINAL HYSTERECTOMY       No Known Allergies  Current Outpatient Medications  Medication Instructions   aspirin 81 MG chewable tablet Oral, Daily   diclofenac (VOLTAREN) 75 mg, Oral, 2 times daily   levothyroxine (SYNTHROID) 62.5 mcg, Oral, Daily before breakfast   lisinopril-hydrochlorothiazide (ZESTORETIC) 20-12.5 MG tablet 1 tablet, Oral, Daily   metFORMIN (GLUCOPHAGE) 500 mg, Oral, 2 times daily with meals, TAKE 1 TABLET BY MOUTH TWICE A DAY WITH A MEAL   pantoprazole (PROTONIX) 40 mg, Oral, Daily   sucralfate (CARAFATE) 1 g, Oral, 2 times daily     VITALS:   Vitals:   05/14/23 1313  BP: 127/62  Pulse: 67  Resp: 18  SpO2: 100%  Weight: 147 lb (66.7 kg)  Height: 5\' 1"  (1.549 m)      NEUROLOGICAL:    05/14/2023    5:00 PM  Montreal Cognitive Assessment   Visuospatial/ Executive (0/5) 2  Naming (0/3) 2  Attention: Read list of digits (0/2) 2  Attention: Read list of letters (0/1) 0  Attention: Serial 7 subtraction starting at 100 (0/3) 0  Language: Repeat phrase (0/2) 1  Language : Fluency (0/1) 0  Abstraction (0/2) 2  Delayed Recall (0/5) 2  Orientation (0/6) 6  Total 17  Adjusted Score (based on education) 18        No data to display            Thank you for allowing [place] the opportunity to participate in the care of this nice patient. Please do not hesitate to contact us for any questions or concerns.   Total time spent on today's visit was 60 minutes dedicated to this patient today, preparing to see patient, examining the patient, ordering tests and/or medications and counseling the patient, documenting clinical information in the EHR or other health record, independently interpreting results and communicating results to the  patient/family, discussing treatment and goals, answering patient's questions and coordinating care.  Cc:  Sonny Masters,  FNP  Marlowe Kays 05/14/2023 5:21 PM

## 2023-05-24 ENCOUNTER — Other Ambulatory Visit: Payer: Self-pay | Admitting: Physician Assistant

## 2023-05-24 DIAGNOSIS — R202 Paresthesia of skin: Secondary | ICD-10-CM

## 2023-05-27 ENCOUNTER — Other Ambulatory Visit: Payer: Medicare Other

## 2023-06-02 ENCOUNTER — Other Ambulatory Visit: Payer: Self-pay | Admitting: Nurse Practitioner

## 2023-06-02 DIAGNOSIS — E0520 Thyrotoxicosis with toxic multinodular goiter without thyrotoxic crisis or storm: Secondary | ICD-10-CM

## 2023-06-03 ENCOUNTER — Ambulatory Visit
Admission: RE | Admit: 2023-06-03 | Discharge: 2023-06-03 | Disposition: A | Discharge status: Home / Self Care | Payer: Medicare Other | Source: Ambulatory Visit | Attending: Physician Assistant | Admitting: Physician Assistant

## 2023-06-03 ENCOUNTER — Ambulatory Visit (INDEPENDENT_AMBULATORY_CARE_PROVIDER_SITE_OTHER): Payer: Medicare Other | Admitting: Neurology

## 2023-06-03 DIAGNOSIS — R202 Paresthesia of skin: Secondary | ICD-10-CM

## 2023-06-03 DIAGNOSIS — I70203 Unspecified atherosclerosis of native arteries of extremities, bilateral legs: Secondary | ICD-10-CM | POA: Diagnosis not present

## 2023-06-03 DIAGNOSIS — M5416 Radiculopathy, lumbar region: Secondary | ICD-10-CM

## 2023-06-03 NOTE — Procedures (Signed)
[facility]  [address] [place], [address]  [place], [place] [address] Tel: [phone] Fax: [phone] Test Date:  06/03/2023  Patient: Andrea Shannon DOB: 1945-10-22 Physician: Nita Sickle, DO  Sex: Female Height: 5\' 1"  Ref Phys: Marlowe Kays, PA-C  ID#: [id number]   Technician:    History: This is a 77 year old female referred for evaluation of neuropathy.  NCV & EMG Findings: Electrodiagnostic testing of the right lower extremity and additional studies of the left shows: Bilateral sural and superficial peroneal sensory responses are within normal limits. Bilateral peroneal and tibial motor responses are within normal limits. Bilateral tibial H reflex studies are within normal limits. Chronic motor axonal loss changes are seen affecting the L3-4 myotomes bilaterally, without accompanied active denervation.   Impression: Chronic L3-4 radiculopathy affecting bilateral lower extremities, mild. There is no evidence of a large fiber sensorimotor polyneuropathy affecting the lower extremities.   ___________________________ Nita Sickle, DO    Nerve Conduction Studies   Stim Site NR Peak (ms) Norm Peak (ms) O-P Amp (V) Norm O-P Amp  Left Sup Peroneal Anti Sensory (Ant Lat Mall)  32 C  12 cm    2.4 <4.6 7.1 >3  Right Sup Peroneal Anti Sensory (Ant Lat Mall)  32 C  12 cm    2.1 <4.6 7.6 >3  Left Sural Anti Sensory (Lat Mall)  32 C  Calf    2.9 <4.6 7.6 >3  Right Sural Anti Sensory (Lat Mall)  32 C  Calf    3.1 <4.6 5.4 >3     Stim Site NR Onset (ms) Norm Onset (ms) O-P Amp (mV) Norm O-P Amp Site1 Site2 Delta-0 (ms) Dist (cm) Vel (m/s) Norm Vel (m/s)  Left Peroneal Motor (Ext Dig Brev)  32 C  Ankle    3.8 <6.0 3.5 >2.5 B Fib Ankle 8.4 36.0 43 >40  B Fib    12.2  3.3  Poplt B Fib 1.6 7.0 44 >40  Poplt    13.8  3.0         Right Peroneal Motor (Ext Dig Brev)  32 C  Ankle    2.9 <6.0 3.5 >2.5 B Fib Ankle 8.0 37.0 46 >40  B Fib    10.9  3.3  Poplt B Fib  1.6 8.0 50 >40  Poplt    12.5  3.2         Left Tibial Motor (Abd Hall Brev)  32 C  Ankle    4.4 <6.0 10.2 >4 Knee Ankle 9.0 41.0 46 >40  Knee    13.4  7.4         Right Tibial Motor (Abd Hall Brev)  32 C  Ankle    4.6 <6.0 12.5 >4 Knee Ankle 8.8 38.0 43 >40  Knee    13.4  8.5          Electromyography   Side Muscle Ins.Act Fibs Fasc Recrt Amp Dur Poly Activation Comment  Right AntTibialis Nml Nml Nml Nml Nml Nml Nml Nml N/A  Right Gastroc Nml Nml Nml Nml Nml Nml Nml Nml N/A  Right Flex Dig Long Nml Nml Nml Nml Nml Nml Nml Nml N/A  Right RectFemoris Nml Nml Nml *1- *1+ *1+ *1+ Nml N/A  Right BicepsFemS Nml Nml Nml Nml Nml Nml Nml Nml N/A  Right GluteusMed Nml Nml Nml Nml Nml Nml Nml Nml N/A  Right AdductorLong Nml Nml Nml *1- *1+ *1+ *1+ Nml N/A  Left AdductorLong Nml Nml Nml *1- *1+ *  1+ *1+ Nml N/A  Left AntTibialis Nml Nml Nml Nml Nml Nml Nml Nml N/A  Left Gastroc Nml Nml Nml Nml Nml Nml Nml Nml N/A  Left RectFemoris Nml Nml Nml *1- *1+ *1+ *1+ Nml N/A  Left GluteusMed Nml Nml Nml Nml Nml Nml Nml Nml N/A      Waveforms:

## 2023-06-07 ENCOUNTER — Telehealth: Payer: Self-pay | Admitting: Physician Assistant

## 2023-06-07 ENCOUNTER — Other Ambulatory Visit: Payer: Self-pay

## 2023-06-07 DIAGNOSIS — M5416 Radiculopathy, lumbar region: Secondary | ICD-10-CM

## 2023-06-07 NOTE — Telephone Encounter (Signed)
Caller states she got lab results back. She wants to sch MRI for back to be done at the same time as her MRI of head which is scheduled for 06/09/23 340PM.

## 2023-06-07 NOTE — Telephone Encounter (Signed)
Put in order called patient sent order to [facility] and sent message to Taron to see if we can get these scheduled together

## 2023-06-09 ENCOUNTER — Ambulatory Visit
Admission: RE | Admit: 2023-06-09 | Discharge: 2023-06-09 | Disposition: A | Discharge status: Home / Self Care | Payer: Medicare Other | Source: Ambulatory Visit | Attending: Physician Assistant | Admitting: Physician Assistant

## 2023-06-09 DIAGNOSIS — R413 Other amnesia: Secondary | ICD-10-CM | POA: Diagnosis not present

## 2023-06-10 ENCOUNTER — Encounter: Payer: Self-pay | Admitting: Family Medicine

## 2023-06-10 ENCOUNTER — Ambulatory Visit (INDEPENDENT_AMBULATORY_CARE_PROVIDER_SITE_OTHER): Payer: Medicare Other | Admitting: Family Medicine

## 2023-06-10 VITALS — BP 122/57 | HR 71 | Temp 97.9°F | Ht 61.0 in | Wt 145.6 lb

## 2023-06-10 DIAGNOSIS — K219 Gastro-esophageal reflux disease without esophagitis: Secondary | ICD-10-CM | POA: Diagnosis not present

## 2023-06-10 DIAGNOSIS — E0520 Thyrotoxicosis with toxic multinodular goiter without thyrotoxic crisis or storm: Secondary | ICD-10-CM | POA: Diagnosis not present

## 2023-06-10 DIAGNOSIS — E118 Type 2 diabetes mellitus with unspecified complications: Secondary | ICD-10-CM

## 2023-06-10 DIAGNOSIS — E1159 Type 2 diabetes mellitus with other circulatory complications: Secondary | ICD-10-CM | POA: Diagnosis not present

## 2023-06-10 DIAGNOSIS — Z7984 Long term (current) use of oral hypoglycemic drugs: Secondary | ICD-10-CM | POA: Diagnosis not present

## 2023-06-10 DIAGNOSIS — F418 Other specified anxiety disorders: Secondary | ICD-10-CM

## 2023-06-10 DIAGNOSIS — I152 Hypertension secondary to endocrine disorders: Secondary | ICD-10-CM | POA: Diagnosis not present

## 2023-06-10 DIAGNOSIS — D229 Melanocytic nevi, unspecified: Secondary | ICD-10-CM

## 2023-06-10 LAB — CBC WITH DIFFERENTIAL/PLATELET
Basophils Absolute: 0 10*3/uL (ref 0.0–0.2)
Basos: 1 %
EOS (ABSOLUTE): 0.1 10*3/uL (ref 0.0–0.4)
Eos: 2 %
Hematocrit: 33.4 % — ABNORMAL LOW (ref 34.0–46.6)
Hemoglobin: 10.4 g/dL — ABNORMAL LOW (ref 11.1–15.9)
Immature Grans (Abs): 0 10*3/uL (ref 0.0–0.1)
Immature Granulocytes: 0 %
Lymphocytes Absolute: 1.8 10*3/uL (ref 0.7–3.1)
Lymphs: 33 %
MCH: 23.8 pg — ABNORMAL LOW (ref 26.6–33.0)
MCHC: 31.1 g/dL — ABNORMAL LOW (ref 31.5–35.7)
MCV: 76 fL — ABNORMAL LOW (ref 79–97)
Monocytes Absolute: 0.4 10*3/uL (ref 0.1–0.9)
Monocytes: 7 %
Neutrophils Absolute: 3.2 10*3/uL (ref 1.4–7.0)
Neutrophils: 57 %
Platelets: 497 10*3/uL — ABNORMAL HIGH (ref 150–450)
RBC: 4.37 x10E6/uL (ref 3.77–5.28)
RDW: 16.8 % — ABNORMAL HIGH (ref 11.7–15.4)
WBC: 5.6 10*3/uL (ref 3.4–10.8)

## 2023-06-10 LAB — BMP8+EGFR
BUN/Creatinine Ratio: 14 (ref 12–28)
BUN: 16 mg/dL (ref 8–27)
CO2: 23 mmol/L (ref 20–29)
Calcium: 10 mg/dL (ref 8.7–10.3)
Chloride: 100 mmol/L (ref 96–106)
Creatinine, Ser: 1.13 mg/dL — ABNORMAL HIGH (ref 0.57–1.00)
Glucose: 62 mg/dL — ABNORMAL LOW (ref 70–99)
Potassium: 4.1 mmol/L (ref 3.5–5.2)
Sodium: 141 mmol/L (ref 134–144)
eGFR: 51 mL/min/{1.73_m2} — ABNORMAL LOW (ref >59)

## 2023-06-10 MED ORDER — HYDROXYZINE HCL 10 MG PO TABS
10.0000 mg | ORAL_TABLET | Freq: Three times a day (TID) | ORAL | 1 refills | Status: DC | PRN
Start: 2023-06-10 — End: 2023-10-07

## 2023-06-10 MED ORDER — SUCRALFATE 1 G PO TABS
1.0000 g | ORAL_TABLET | Freq: Two times a day (BID) | ORAL | 1 refills | Status: AC
Start: 2023-06-10 — End: ?

## 2023-06-10 MED ORDER — LISINOPRIL-HYDROCHLOROTHIAZIDE 20-12.5 MG PO TABS
1.0000 | ORAL_TABLET | Freq: Every day | ORAL | 1 refills | Status: DC
Start: 2023-06-10 — End: 2023-10-07

## 2023-06-10 NOTE — Progress Notes (Signed)
Subjective:  Patient ID: Andrea Shannon, female    DOB: 1946-09-28, 77 y.o.   MRN: [id number]  Patient Care Team: Sonny Masters, FNP as PCP - General (Family Medicine) Babs Bertin, OD (Optometry) Dani Gobble, NP as Nurse Practitioner (Nurse Practitioner) Adam Phenix, DPM as Consulting Physician (Podiatry) Gwynneth Munson Corrie Dandy (Neurology)   Chief Complaint:  Diabetes (3 month follow up )   HPI: Andrea Shannon is a 77 y.o. female presenting on 06/10/2023 for Diabetes (3 month follow up )    1. Diabetes mellitus type 2 with complications (HCC) Pt has been taking her metformin as prescribed. Denies associated side effects. Denies polyuria, polyphagia, or polydipsia.   2. Hypertension associated with diabetes (HCC) Complaint with meds - Yes Current Medications - Zestoretic Checking BP at home - no Exercising Regularly - Yes Watching Salt intake - Yes Pertinent ROS:  Headache - No Fatigue - No Visual Disturbances - No Chest pain - No Dyspnea - No Palpitations - No LE edema - No They report good compliance with medications and can restate their regimen by memory. No medication side effects.  BP Readings from Last 3 Encounters:  06/10/23 (!) 122/57  05/14/23 127/62  05/06/23 109/61     3. Gastroesophageal reflux disease without esophagitis Compliant with medications - Yes Current medications - Protonix and Carafate Adverse side effects - No Cough - No Sore throat - No Voice change - No Hemoptysis - No Dysphagia or dyspepsia - No Water brash - No Red Flags (weight loss, hematochezia, melena, weight loss, early satiety, fevers, odynophagia, or persistent vomiting) - No   4. Toxic multinodul goiter Followed by endocrinology on a regular basis. No changes in medications. Denies hyper- or hypothyroid symptoms.   5. Situational anxiety Reports increased situational anxiety at times. Not every day but is significant at times. Was on Atarax as  needed in the past and tolerated very well, states she feels she may need this again.     06/10/2023   10:25 AM 03/10/2023   10:24 AM 01/06/2023   11:25 AM 12/02/2022    9:43 AM 09/29/2022    3:02 PM  Depression screen PHQ 2/9  Decreased Interest 0 0 0 0 0  Down, Depressed, Hopeless 0 0 0 0 0  PHQ - 2 Score 0 0 0 0 0  Altered sleeping 0 0  0   Tired, decreased energy 0 0  0   Change in appetite 0 0  0   Feeling bad or failure about yourself  0 0  0   Trouble concentrating 0 0  0   Moving slowly or fidgety/restless 0 0  0   Suicidal thoughts 0 0  0   PHQ-9 Score 0 0  0   Difficult doing work/chores Not difficult at all Not difficult at all  Not difficult at all       06/10/2023   10:25 AM 03/10/2023   10:24 AM 12/02/2022    9:43 AM 08/28/2022    8:27 AM  GAD 7 : Generalized Anxiety Score  Nervous, Anxious, on Edge 0 0 0 0  Control/stop worrying 0 0 0 0  Worry too much - different things 0 0 0 0  Trouble relaxing 0 0 0 0  Restless 0 0 0 0  Easily annoyed or irritable 0 0 0 0  Afraid - awful might happen 0 0 0 0  Total GAD 7 Score 0 0 0 0  Anxiety  Difficulty Not difficult at all Not difficult at all Not difficult at all Not difficult at all    Pt reports she has a place on her scalp she would like looked at.      Relevant past medical, surgical, family, and social history reviewed and updated as indicated.  Allergies and medications reviewed and updated. Data reviewed: Chart in Epic.   Past Medical History:  Diagnosis Date   Acid reflux    Arthritis    Diabetes mellitus, type II (HCC)    Hypertension    Hyperthyroidism    Hypothyroidism     Past Surgical History:  Procedure Laterality Date   ABDOMINAL HYSTERECTOMY      Social History   Socioeconomic History   Marital status: Widowed    Spouse name: Not on file   Number of children: 1   Years of education: 12   Highest education level: High school graduate  Occupational History   Not on file  Tobacco Use    Smoking status: Never   Smokeless tobacco: Never  Vaping Use   Vaping status: Never Used  Substance and Sexual Activity   Alcohol use: Not Currently   Drug use: Never   Sexual activity: Not Currently    Birth control/protection: Surgical  Other Topics Concern   Not on file  Social History Narrative   Right handed   Drinks caffeine coffee   Lives alone   Retired   One son   Social Determinants of Health   Financial Resource Strain: Low Risk  (01/06/2023)   Overall Financial Resource Strain (CARDIA)    Difficulty of Paying Living Expenses: Not hard at all  Food Insecurity: No Food Insecurity (01/06/2023)   Hunger Vital Sign    Worried About Running Out of Food in the Last Year: Never true    Ran Out of Food in the Last Year: Never true  Transportation Needs: No Transportation Needs (01/06/2023)   PRAPARE - Administrator, Civil Service (Medical): No    Lack of Transportation (Non-Medical): No  Physical Activity: Insufficiently Active (01/06/2023)   Exercise Vital Sign    Days of Exercise per Week: 3 days    Minutes of Exercise per Session: 30 min  Stress: No Stress Concern Present (01/06/2023)   [employer] of Occupational Health - Occupational Stress Questionnaire    Feeling of Stress : Not at all  Social Connections: Moderately Integrated (01/06/2023)   Social Connection and Isolation Panel [NHANES]    Frequency of Communication with Friends and Family: More than three times a week    Frequency of Social Gatherings with Friends and Family: More than three times a week    Attends Religious Services: More than 4 times per year    Active Member of [employer] or Organizations: Yes    Attends Banker Meetings: More than 4 times per year    Marital Status: Widowed  Intimate Partner Violence: Not At Risk (01/06/2023)   Humiliation, Afraid, Rape, and Kick questionnaire    Fear of Current or Ex-Partner: No    Emotionally Abused: No    Physically Abused:  No    Sexually Abused: No    Outpatient Encounter Medications as of 06/10/2023  Medication Sig   aspirin 81 MG chewable tablet Chew by mouth daily.   diclofenac (VOLTAREN) 75 MG EC tablet Take 1 tablet (75 mg total) by mouth 2 (two) times daily.   hydrOXYzine (ATARAX) 10 MG tablet Take 1 tablet (10 mg  total) by mouth 3 (three) times daily as needed for anxiety.   levothyroxine (SYNTHROID) 125 MCG tablet TAKE 1/2 TABLET BY MOUTH EVERY MORNING BEFORE BREAKFAST   metFORMIN (GLUCOPHAGE) 500 MG tablet Take 1 tablet (500 mg total) by mouth 2 (two) times daily with a meal. TAKE 1 TABLET BY MOUTH TWICE A DAY WITH A MEAL   pantoprazole (PROTONIX) 40 MG tablet Take 1 tablet (40 mg total) by mouth daily.   [DISCONTINUED] lisinopril-hydrochlorothiazide (ZESTORETIC) 20-12.5 MG tablet Take 1 tablet by mouth daily.   [DISCONTINUED] sucralfate (CARAFATE) 1 g tablet Take 1 tablet (1 g total) by mouth 2 (two) times daily.   lisinopril-hydrochlorothiazide (ZESTORETIC) 20-12.5 MG tablet Take 1 tablet by mouth daily.   sucralfate (CARAFATE) 1 g tablet Take 1 tablet (1 g total) by mouth 2 (two) times daily.   No facility-administered encounter medications on file as of 06/10/2023.    No Known Allergies  Review of Systems  Constitutional:  Negative for activity change, appetite change, chills, diaphoresis, fatigue, fever and unexpected weight change.  HENT: Negative.    Eyes: Negative.  Negative for photophobia and visual disturbance.  Respiratory:  Negative for cough, chest tightness and shortness of breath.   Cardiovascular:  Negative for chest pain, palpitations and leg swelling.  Gastrointestinal:  Negative for abdominal pain, blood in stool, constipation, diarrhea, nausea and vomiting.  Endocrine: Negative.  Negative for cold intolerance, heat intolerance, polydipsia, polyphagia and polyuria.  Genitourinary:  Negative for decreased urine volume, difficulty urinating, dysuria, frequency and urgency.   Musculoskeletal:  Negative for arthralgias and myalgias.  Skin: Negative.   Allergic/Immunologic: Negative.   Neurological:  Negative for dizziness, tremors, seizures, syncope, facial asymmetry, speech difficulty, weakness, light-headedness, numbness and headaches.  Hematological: Negative.   Psychiatric/Behavioral:  Positive for agitation. Negative for behavioral problems, confusion, decreased concentration, dysphoric mood, hallucinations, self-injury, sleep disturbance and suicidal ideas. The patient is nervous/anxious. The patient is not hyperactive.   All other systems reviewed and are negative.       Objective:  BP (!) 122/57   Pulse 71   Temp 97.9 F (36.6 C) (Temporal)   Ht 5\' 1"  (1.549 m)   Wt 145 lb 9.6 oz (66 kg)   SpO2 100%   BMI 27.51 kg/m    Wt Readings from Last 3 Encounters:  06/10/23 145 lb 9.6 oz (66 kg)  05/14/23 147 lb (66.7 kg)  05/06/23 146 lb 6.4 oz (66.4 kg)    Physical Exam Vitals and nursing note reviewed.  Constitutional:      General: She is not in acute distress.    Appearance: Normal appearance. She is well-developed and well-groomed. She is not ill-appearing, toxic-appearing or diaphoretic.  HENT:     Head: Normocephalic and atraumatic.     Jaw: There is normal jaw occlusion.     Right Ear: Hearing normal.     Left Ear: Hearing normal.     Nose: Nose normal.     Mouth/Throat:     Lips: Pink.     Mouth: Mucous membranes are moist.     Pharynx: Oropharynx is clear. Uvula midline.  Eyes:     General: Lids are normal.     Extraocular Movements: Extraocular movements intact.     Conjunctiva/sclera: Conjunctivae normal.     Pupils: Pupils are equal, round, and reactive to light.  Neck:     Thyroid: No thyroid mass, thyromegaly or thyroid tenderness.     Vascular: No carotid bruit or JVD.  Trachea: Trachea and phonation normal.  Cardiovascular:     Rate and Rhythm: Normal rate and regular rhythm.     Chest Wall: PMI is not displaced.      Pulses: Normal pulses.     Heart sounds: Normal heart sounds. No murmur heard.    No friction rub. No gallop.  Pulmonary:     Effort: Pulmonary effort is normal. No respiratory distress.     Breath sounds: Normal breath sounds. No wheezing.  Abdominal:     General: Bowel sounds are normal. There is no distension or abdominal bruit.     Palpations: Abdomen is soft. There is no hepatomegaly or splenomegaly.     Tenderness: There is no abdominal tenderness. There is no right CVA tenderness or left CVA tenderness.     Hernia: No hernia is present.  Musculoskeletal:        General: Normal range of motion.     Cervical back: Normal range of motion and neck supple.     Right lower leg: No edema.     Left lower leg: No edema.  Lymphadenopathy:     Cervical: No cervical adenopathy.  Skin:    General: Skin is warm and dry.     Capillary Refill: Capillary refill takes less than 2 seconds.     Coloration: Skin is not cyanotic, jaundiced or pale.     Findings: No rash.       Neurological:     General: No focal deficit present.     Mental Status: She is alert and oriented to person, place, and time.     Sensory: Sensation is intact.     Motor: Motor function is intact.     Coordination: Coordination is intact.     Gait: Gait is intact.     Deep Tendon Reflexes: Reflexes are normal and symmetric.  Psychiatric:        Attention and Perception: Attention and perception normal.        Mood and Affect: Mood and affect normal.        Speech: Speech normal.        Behavior: Behavior normal. Behavior is cooperative.        Thought Content: Thought content normal.        Cognition and Memory: Cognition and memory normal.        Judgment: Judgment normal.    Results for orders placed or performed in visit on 05/14/23  HgB A1c  Result Value Ref Range   Hgb A1c MFr Bld 6.3 (H) <5.7 % of total Hgb   Mean Plasma Glucose 134 mg/dL   eAG (mmol/L) 7.4 mmol/L  C-reactive protein  Result Value  Ref Range   CRP 10.6 (H) <8.0 mg/L  Sed Rate (ESR)  Result Value Ref Range   Sed Rate 2 0 - 30 mm/h  Uric acid  Result Value Ref Range   Uric Acid, Serum 6.3 2.5 - 7.0 mg/dL  Vitamin B1  Result Value Ref Range   Vitamin B1 (Thiamine) 15 8 - 30 nmol/L  HIV 1 RNA quant-no reflex-bld  Result Value Ref Range   HIV 1 RNA Quant Not Detected Copies/mL   HIV-1 RNA Quant, Log Not Detected Log cps/mL  Copper, serum  Result Value Ref Range   Copper 115 70 - 175 mcg/dL  Protein electrophoresis, serum  Result Value Ref Range   Total Protein 7.1 6.1 - 8.1 g/dL   Albumin ELP 4.6 3.8 - 4.8 g/dL   Alpha  1 0.3 0.2 - 0.3 g/dL   Alpha 2 0.5 0.5 - 0.9 g/dL   Beta Globulin 0.5 0.4 - 0.6 g/dL   Beta 2 0.4 0.2 - 0.5 g/dL   Gamma Globulin 0.9 0.8 - 1.7 g/dL   SPE Interp.    ANA  Result Value Ref Range   Anti Nuclear Antibody (ANA) NEGATIVE NEGATIVE  Vitamin B6  Result Value Ref Range   Vitamin B6 32.0 (H) 2.1 - 21.7 ng/mL  Lyme Disease, Western Blot  Result Value Ref Range     IgG P93 Ab. Absent      IgG P66 Ab. Absent      IgG P58 Ab. Present (A)      IgG P45 Ab. Absent      IgG P41 Ab. Present (A)      IgG P39 Ab. Absent      IgG P30 Ab. Absent      IgG P28 Ab. Absent      IgG P23 Ab. Absent      IgG P18 Ab. Absent    Lyme IgG Wb Negative      IgM P41 Ab. Absent      IgM P39 Ab. Absent      IgM P23 Ab. Absent    Lyme IgM Wb Negative        Pertinent labs & imaging results that were available during my care of the patient were reviewed by me and considered in my medical decision making.  Assessment & Plan:  Heilyn was seen today for diabetes.  Diagnoses and all orders for this visit:  Diabetes mellitus type 2 with complications (HCC) A1C last month 6.3. Keep up the good work. BMP and CBC pending.  -     BMP8+EGFR -     CBC with Differential/Platelet  Hypertension associated with diabetes (HCC) BP well controlled. Changes were not made in regimen today. Goal BP is 130/80.  Pt aware to report any persistent high or low readings. DASH diet and exercise encouraged. Exercise at least 150 minutes per week and increase as tolerated. Goal BMI > 25. Stress management encouraged. Avoid nicotine and tobacco product use. Avoid excessive alcohol and NSAID's. Avoid more than 2000 mg of sodium daily. Medications as prescribed. Follow up as scheduled.  -     lisinopril-hydrochlorothiazide (ZESTORETIC) 20-12.5 MG tablet; Take 1 tablet by mouth daily. -     BMP8+EGFR -     CBC with Differential/Platelet  Gastroesophageal reflux disease without esophagitis No red flags present. Diet discussed. Avoid fried, spicy, fatty, greasy, and acidic foods. Avoid caffeine, nicotine, and alcohol. Do not eat 2-3 hours before bedtime and stay upright for at least 1-2 hours after eating. Eat small frequent meals. Avoid NSAID's like motrin and aleve. Medications as prescribed. Report any new or worsening symptoms. Follow up as discussed or sooner if needed.   -     sucralfate (CARAFATE) 1 g tablet; Take 1 tablet (1 g total) by mouth 2 (two) times daily. -     CBC with Differential/Platelet  Toxic multinodul goiter Followed by endocrinology on a regular basis.   Situational anxiety Will treat with below as needed, has taken before with good results. Sedation and fall precautions discussed in detail. Report new, worsening, or persistent symptoms.  -     hydrOXYzine (ATARAX) 10 MG tablet; Take 1 tablet (10 mg total) by mouth 3 (three) times daily as needed for anxiety.     Continue all other maintenance medications.  Follow  up plan: Return in about 3 months (around 09/10/2023) for CPE.   Continue healthy lifestyle choices, including diet (rich in fruits, vegetables, and lean proteins, and low in salt and simple carbohydrates) and exercise (at least 30 minutes of moderate physical activity daily).  Educational handout given for DM  The above assessment and management plan was discussed with the  patient. The patient verbalized understanding of and has agreed to the management plan. Patient is aware to call the clinic if they develop any new symptoms or if symptoms persist or worsen. Patient is aware when to return to the clinic for a follow-up visit. Patient educated on when it is appropriate to go to the emergency department.   Kari Baars, FNP-C [facility] [phone]

## 2023-06-10 NOTE — Patient Instructions (Signed)

## 2023-06-11 NOTE — Progress Notes (Signed)
Please, inform patient that the MRI results show chronic aging changes .No stroke, masses, fluid or infection is seen. Thank you

## 2023-06-17 ENCOUNTER — Telehealth: Payer: Self-pay | Admitting: Family Medicine

## 2023-06-17 NOTE — Telephone Encounter (Signed)
Pt called stating that she recently had a visit with PCP and discussed cramps in her legs and feet. Pt says she knows PCP doesn't want her on a lot of meds because of her kidneys but wants to know if PCP can prescribe her something for the cramps because she hurts so bad sometimes.

## 2023-06-17 NOTE — Telephone Encounter (Signed)
I want her to try a magnesium supplement to see if beneficial, if not, will discuss other options. This will be over the counter. Mag Tech is a good supplement.

## 2023-06-17 NOTE — Telephone Encounter (Signed)
Spoke with pt - she agreed to get medication and try it

## 2023-07-19 ENCOUNTER — Ambulatory Visit
Admission: RE | Admit: 2023-07-19 | Discharge: 2023-07-19 | Disposition: A | Discharge status: Home / Self Care | Payer: Medicare Other | Source: Ambulatory Visit | Attending: Physician Assistant | Admitting: Physician Assistant

## 2023-07-19 DIAGNOSIS — M47816 Spondylosis without myelopathy or radiculopathy, lumbar region: Secondary | ICD-10-CM | POA: Diagnosis not present

## 2023-07-19 DIAGNOSIS — M5126 Other intervertebral disc displacement, lumbar region: Secondary | ICD-10-CM | POA: Diagnosis not present

## 2023-07-19 DIAGNOSIS — M5416 Radiculopathy, lumbar region: Secondary | ICD-10-CM

## 2023-07-19 DIAGNOSIS — M4316 Spondylolisthesis, lumbar region: Secondary | ICD-10-CM | POA: Diagnosis not present

## 2023-07-22 ENCOUNTER — Telehealth: Payer: Self-pay | Admitting: Family Medicine

## 2023-07-22 DIAGNOSIS — K219 Gastro-esophageal reflux disease without esophagitis: Secondary | ICD-10-CM

## 2023-07-22 MED ORDER — SUCRALFATE 1 G PO TABS: 1.0000 g | ORAL_TABLET | Freq: Two times a day (BID) | ORAL | 0 refills | Status: DC

## 2023-07-22 NOTE — Telephone Encounter (Signed)
  Prescription Request  07/22/2023  Is this a "Controlled Substance" medicine? sucralfate (CARAFATE) 1 g tablet with 3 mo supply  Have you seen your PCP in the last 2 weeks?   If YES, route message to pool  -  If NO, patient needs to be scheduled for appointment.  What is the name of the medication or equipment? sucralfate (CARAFATE) 1 g tablet with 3 mo supply  Have you contacted your pharmacy to request a refill? No new rx   Which pharmacy would you like this sent to? [facility]   Patient notified that their request is being sent to the clinical staff for review and that they should receive a response within 2 business days.

## 2023-07-22 NOTE — Telephone Encounter (Signed)
Pt aware refill sent to pharmacy 

## 2023-07-29 ENCOUNTER — Encounter: Payer: Self-pay | Admitting: Physician Assistant

## 2023-07-29 ENCOUNTER — Ambulatory Visit (INDEPENDENT_AMBULATORY_CARE_PROVIDER_SITE_OTHER): Payer: Medicare Other | Admitting: Physician Assistant

## 2023-07-29 VITALS — BP 113/72 | HR 65 | Resp 18 | Ht 61.0 in | Wt 145.0 lb

## 2023-07-29 DIAGNOSIS — R413 Other amnesia: Secondary | ICD-10-CM

## 2023-07-29 DIAGNOSIS — R202 Paresthesia of skin: Secondary | ICD-10-CM | POA: Diagnosis not present

## 2023-07-29 DIAGNOSIS — M5416 Radiculopathy, lumbar region: Secondary | ICD-10-CM | POA: Diagnosis not present

## 2023-07-29 MED ORDER — DULOXETINE HCL 20 MG PO CPEP: 20.0000 mg | ORAL_CAPSULE | Freq: Every day | ORAL | 3 refills | Status: DC

## 2023-07-29 NOTE — Progress Notes (Signed)
Assessment/Plan:   Memory Impairment, concerning for dementia   Andrea Shannon is a very pleasant 77 y.o. RH female with a history of hypertension, hypothyroidism, GERD, arthritis, vitamin D deficiency, iron deficiency anemia DM with neuropathy seen today in follow up to discuss the MRI of the brain results from 06/09/2023. These were personally reviewed, remarkable for mild generalized cerebral and cerebellar atrophy, otherwise unremarkable noncontrast MRI appearance of the brain for age.  No acute intracranial abnormalities. Patient is not on antidementia medication. Given the fact that she reports no worsening of memory and her main complaint is neuropathy, will defer till next visit a consideration to start any agents. Last [facility] 05/14/23 was 18/30. Patient is able to participate on his ADLs and to drive without difficulties   Previous records as well as any outside records available were reviewed prior to todays visit.     Follow up in 3 months.  Will consider starting on the dementia medication in the near future, but for now  the patient is to start Cymbalta for peripheral neuropathy. Recommend good control of cardiovascular risk factors Continue to control mood as per PCP       Bilateral Leg paresthesias and pain    EMG-NCS 06/03/2023 was negative for large fiber neuropathy. MRI of the lumbar spine without contrast remarkable for significant lumbar stenosis/radiculopathy . Referral to Neurosurgery is indicated  Patient also has microcytic anemia which may contribute to symptoms, this needs to be followed by iron studies by PCP Start Cymbalta 30 mg daily, side effects discussed  Recommend good control of A1c, latest value of 6.3.  Follow up thyroid disease and iron deficiency Follow up in 3 months     Initial visit 05/14/23 How long did patient have memory difficulties? "For years need a little time to remember but I do". Patient denies difficulty remembering recent conversations  and has more difficulty with  people names but  "I am good at remembering faces" repeats oneself?  Endorsed Disoriented when walking into a room?  Patient denies except occasionally not remembering what patient came to the room for    Leaving objects in unusual places? denies   Wandering behavior?  denies   Any personality changes?   " I have mood swings but not as bad as when in menopause" Any history of depression?: Endorsed, dating back for "many years, but I know how to get myself out of it"  Hallucinations or paranoia?  Patient denies   Seizures?   Patient denies    Any sleep changes? " I don't  sleep that well". I have vivid dreams, denies REM behavior or sleepwalking   Sleep apnea?  Patient denies   Any hygiene concerns?  Patient denies   Independent of bathing and dressing?  Endorsed  Does the patient needs help with medications? Patient is in charge   Who is in charge of the finances? Patient is in charge     Any changes in appetite? " Not too good because of my GERD" Patient have trouble swallowing? denies   Does the patient cook? Nothing much .   Any kitchen accidents such as leaving the stove on? denies   Any headaches?   denies   Chronic back pain ? denies   Ambulates with difficulty?  denies   Recent falls or head injuries? denies   Vision changes?"I have cataracts'  Unilateral weakness, numbness or tingling?  She has a history of paresthesias in the setting of DM2.  She is not on  any medications for this, tried gabapentin but was "too sleepy" with it.  For the last 2 years she reports a burning cold sensation in the feet, sometimes with leg cramps. Feeling is intermittent She is not sure what makes it better or worse. She states that her feet stay cold all day . "Support socks help a little".  Denies imbalance when walking.  The patient has not  noticed any recent skin rashes , fever, night sweats, anorexia or unintentional weight loss  Any tremors?   denies   Any anosmia?   denies   Any incontinence of urine?  She has a history of bladder prolapse.  Any bowel dysfunction? denies      Patient lives alone History of heavy alcohol intake? denies   History of heavy tobacco use? denies   Family history of dementia? denies  Does patient drive? Yes no issues   Pertinent labs 04/2023: A1c 6.3 (elevated), CRP 10.6 (mildly elevated), ESR 2, uric acid 6.3, B1 15, HIV negative, copper 115, SPEP negative, ANA negative, B6 32 (elevated), Lyme disease negative, latest CBC August 2024 H&H 10.4-33.4 respectively, with low MCV 76, platelets 487, suspicious for microcytic anemia.  EMG remarkable for chronic L3-L4 radiculopathy affecting bilateral lower extremities, mild.  There is no evidence of large fiber sensorimotor polyneuropathy affecting the lower extremities.  MRI L spine wo contrast 07/29/23 personally reviewed shows 1. Progressive advanced facet hypertrophy and grade 1anterolisthesis at L4-5 with progressive uncovering of a broad-based disc protrusion. This leads to progressive moderate central canal stenosis with left greater than right subarticular stenosis.2. Mild to moderate foraminal narrowing bilaterally at L4-5.3. Shallow central disc protrusion at L5-S1 potentially contacts the traversing S1 nerve roots bilaterally.4. New leftward disc protrusion at L3-4 extends into the left neural foramen without significant focal stenosis.  CURRENT MEDICATIONS:  Outpatient Encounter Medications as of 07/29/2023  Medication Sig   aspirin 81 MG chewable tablet Chew by mouth daily.   diclofenac (VOLTAREN) 75 MG EC tablet Take 1 tablet (75 mg total) by mouth 2 (two) times daily.   hydrOXYzine (ATARAX) 10 MG tablet Take 1 tablet (10 mg total) by mouth 3 (three) times daily as needed for anxiety.   levothyroxine (SYNTHROID) 125 MCG tablet TAKE 1/2 TABLET BY MOUTH EVERY MORNING BEFORE BREAKFAST   lisinopril-hydrochlorothiazide (ZESTORETIC) 20-12.5 MG tablet Take 1 tablet by mouth daily.    metFORMIN (GLUCOPHAGE) 500 MG tablet Take 1 tablet (500 mg total) by mouth 2 (two) times daily with a meal. TAKE 1 TABLET BY MOUTH TWICE A DAY WITH A MEAL   pantoprazole (PROTONIX) 40 MG tablet Take 1 tablet (40 mg total) by mouth daily.   sucralfate (CARAFATE) 1 g tablet Take 1 tablet (1 g total) by mouth 2 (two) times daily.   No facility-administered encounter medications on file as of 07/29/2023.        No data to display            05/14/2023    5:00 PM  Montreal Cognitive Assessment   Visuospatial/ Executive (0/5) 2  Naming (0/3) 2  Attention: Read list of digits (0/2) 2  Attention: Read list of letters (0/1) 0  Attention: Serial 7 subtraction starting at 100 (0/3) 0  Language: Repeat phrase (0/2) 1  Language : Fluency (0/1) 0  Abstraction (0/2) 2  Delayed Recall (0/5) 2  Orientation (0/6) 6  Total 17  Adjusted Score (based on education) 18   Thank you for allowing [place] the opportunity to participate in  the care of this nice patient. Please do not hesitate to contact us for any questions or concerns.   Total time spent on today's visit was 25 minutes dedicated to this patient today, preparing to see patient, examining the patient, ordering tests and/or medications and counseling the patient, documenting clinical information in the EHR or other health record, independently interpreting results and communicating results to the patient/family, discussing treatment and goals, answering patient's questions and coordinating care.  Cc:  Sonny Masters, FNP  Marlowe Kays 07/29/2023 6:55 AM

## 2023-07-29 NOTE — Patient Instructions (Addendum)
It was a pleasure to see you today at our office.   Recommendations:   Referral to Neurosurgery for back problems Start Cymbalta 20 mg  daily for neuropathy  Take you iron for iron deficiency anemia  Monitor your sugars Return in 3 months Jan 15  at 11:30     For psychiatric meds, mood meds: Please have your primary care physician manage these medications.  If you have any severe symptoms of a stroke, or other severe issues such as confusion,severe chills or fever, etc call 911 or go to the ER as you may need to be evaluated further     For assessment of decision of mental capacity and competency:  Call Dr. Erick Blinks, geriatric psychiatrist at [phone]    Whom to call: Memory  decline, memory medications: Call our office [phone]    [email or web address]   RECOMMENDATIONS FOR ALL PATIENTS WITH MEMORY PROBLEMS: 1. Continue to exercise (Recommend 30 minutes of walking everyday, or 3 hours every week) 2. Increase social interactions - continue going to [place] and enjoy social gatherings with friends and family 3. Eat healthy, avoid fried foods and eat more fruits and vegetables 4. Maintain adequate blood pressure, blood sugar, and blood cholesterol level. Reducing the risk of stroke and cardiovascular disease also helps promoting better memory. 5. Avoid stressful situations. Live a simple life and avoid aggravations. Organize your time and prepare for the next day in anticipation. 6. Sleep well, avoid any interruptions of sleep and avoid any distractions in the bedroom that may interfere with adequate sleep quality 7. Avoid sugar, avoid sweets as there is a strong link between excessive sugar intake, diabetes, and cognitive impairment We discussed the Mediterranean diet, which has been shown to help patients reduce the risk of progressive memory disorders and reduces cardiovascular risk. This includes eating fish, eat  fruits and green leafy vegetables, nuts like almonds and hazelnuts, walnuts, and also use olive oil. Avoid fast foods and fried foods as much as possible. Avoid sweets and sugar as sugar use has been linked to worsening of memory function.  There is always a concern of gradual progression of memory problems. If this is the case, then we may need to adjust level of care according to patient needs. Support, both to the patient and caregiver, should then be put into place.      You have been referred for a neuropsychological evaluation (i.e., evaluation of memory and thinking abilities). Please bring someone with you to this appointment if possible, as it is helpful for the doctor to hear from both you and another adult who knows you well. Please bring eyeglasses and hearing aids if you wear them.    The evaluation will take approximately 3 hours and has two parts:   The first part is a clinical interview with the neuropsychologist (Dr. Milbert Coulter or Dr. Roseanne Reno). During the interview, the neuropsychologist will speak with you and the individual you brought to the appointment.    The second part of the evaluation is testing with the doctor's technician Annabelle Harman or Selena Batten). During the testing, the technician will ask you to remember different types of material, solve problems, and answer some questionnaires. Your family member will not be present for this portion of the evaluation.   Please note: We must reserve several hours of the neuropsychologist's time and the psychometrician's time for your evaluation appointment. As such, there is a No-Show fee of $100. If you are unable to attend any of your appointments, please contact  our office as soon as possible to reschedule.      DRIVING: Regarding driving, in patients with progressive memory problems, driving will be impaired. We advise to have someone else do the driving if trouble finding directions or if minor accidents are reported. Independent driving  assessment is available to determine safety of driving.   If you are interested in the driving assessment, you can contact the following:  [employer] in [place] [phone]  [facility] [phone]  [facility] [phone]  [facility] [phone] or [phone]   FALL PRECAUTIONS: Be cautious when walking. Scan the area for obstacles that may increase the risk of trips and falls. When getting up in the mornings, sit up at the edge of the bed for a few minutes before getting out of bed. Consider elevating the bed at the head end to avoid drop of blood pressure when getting up. Walk always in a well-lit room (use night lights in the walls). Avoid area rugs or power cords from appliances in the middle of the walkways. Use a walker or a cane if necessary and consider physical therapy for balance exercise. Get your eyesight checked regularly.  FINANCIAL OVERSIGHT: Supervision, especially oversight when making financial decisions or transactions is also recommended.  HOME SAFETY: Consider the safety of the kitchen when operating appliances like stoves, microwave oven, and blender. Consider having supervision and share cooking responsibilities until no longer able to participate in those. Accidents with firearms and other hazards in the house should be identified and addressed as well.   ABILITY TO BE LEFT ALONE: If patient is unable to contact 911 operator, consider using Life[employer], or when the need is there, arrange for someone to stay with patients. Smoking is a fire hazard, consider supervision or cessation. Risk of wandering should be assessed by caregiver and if detected at any point, supervision and safe proof recommendations should be instituted.  MEDICATION SUPERVISION: Inability to self-administer medication needs to be constantly addressed. Implement a mechanism to ensure safe administration of the  medications.      Mediterranean Diet A Mediterranean diet refers to food and lifestyle choices that are based on the traditions of countries located on the [employer]. This way of eating has been shown to help prevent certain conditions and improve outcomes for people who have chronic diseases, like kidney disease and heart disease. What are tips for following this plan? Lifestyle  Cook and eat meals together with your family, when possible. Drink enough fluid to keep your urine clear or pale yellow. Be physically active every day. This includes: Aerobic exercise like running or swimming. Leisure activities like gardening, walking, or housework. Get 7-8 hours of sleep each night. If recommended by your health care provider, drink red wine in moderation. This means 1 glass a day for nonpregnant women and 2 glasses a day for men. A glass of wine equals 5 oz (150 mL). Reading food labels  Check the serving size of packaged foods. For foods such as rice and pasta, the serving size refers to the amount of cooked product, not dry. Check the total fat in packaged foods. Avoid foods that have saturated fat or trans fats. Check the ingredients list for added sugars, such as corn syrup. Shopping  At the grocery store, buy most of your food from the areas near the walls of the store. This includes: Fresh fruits and vegetables (produce). Grains, beans, nuts, and seeds. Some of these may be available in unpackaged forms  or large amounts (in bulk). Fresh seafood. Poultry and eggs. Low-fat dairy products. Buy whole ingredients instead of prepackaged foods. Buy fresh fruits and vegetables in-season from local farmers markets. Buy frozen fruits and vegetables in resealable bags. If you do not have access to quality fresh seafood, buy precooked frozen shrimp or canned fish, such as tuna, salmon, or sardines. Buy small amounts of raw or cooked vegetables, salads, or olives from the deli or salad  bar at your store. Stock your pantry so you always have certain foods on hand, such as olive oil, canned tuna, canned tomatoes, rice, pasta, and beans. Cooking  Cook foods with extra-virgin olive oil instead of using butter or other vegetable oils. Have meat as a side dish, and have vegetables or grains as your main dish. This means having meat in small portions or adding small amounts of meat to foods like pasta or stew. Use beans or vegetables instead of meat in common dishes like chili or lasagna. Experiment with different cooking methods. Try roasting or broiling vegetables instead of steaming or sauteing them. Add frozen vegetables to soups, stews, pasta, or rice. Add nuts or seeds for added healthy fat at each meal. You can add these to yogurt, salads, or vegetable dishes. Marinate fish or vegetables using olive oil, lemon juice, garlic, and fresh herbs. Meal planning  Plan to eat 1 vegetarian meal one day each week. Try to work up to 2 vegetarian meals, if possible. Eat seafood 2 or more times a week. Have healthy snacks readily available, such as: Vegetable sticks with hummus. Greek yogurt. Fruit and nut trail mix. Eat balanced meals throughout the week. This includes: Fruit: 2-3 servings a day Vegetables: 4-5 servings a day Low-fat dairy: 2 servings a day Fish, poultry, or lean meat: 1 serving a day Beans and legumes: 2 or more servings a week Nuts and seeds: 1-2 servings a day Whole grains: 6-8 servings a day Extra-virgin olive oil: 3-4 servings a day Limit red meat and sweets to only a few servings a month What are my food choices? Mediterranean diet Recommended Grains: Whole-grain pasta. Brown rice. Bulgar wheat. Polenta. Couscous. Whole-wheat bread. Orpah Cobb. Vegetables: Artichokes. Beets. Broccoli. Cabbage. Carrots. Eggplant. Green beans. Chard. Kale. Spinach. Onions. Leeks. Peas. Squash. Tomatoes. Peppers. Radishes. Fruits: Apples. Apricots. Avocado. Berries.  Bananas. Cherries. Dates. Figs. Grapes. Lemons. Melon. Oranges. Peaches. Plums. Pomegranate. Meats and other protein foods: Beans. Almonds. Sunflower seeds. Pine nuts. Peanuts. Cod. Salmon. Scallops. Shrimp. Tuna. Tilapia. Clams. Oysters. Eggs. Dairy: Low-fat milk. Cheese. Greek yogurt. Beverages: Water. Red wine. Herbal tea. Fats and oils: Extra virgin olive oil. Avocado oil. Grape seed oil. Sweets and desserts: [place] yogurt with honey. Baked apples. Poached pears. Trail mix. Seasoning and other foods: Basil. Cilantro. Coriander. Cumin. Mint. Parsley. Sage. Rosemary. Tarragon. Garlic. Oregano. Thyme. Pepper. Balsalmic vinegar. Tahini. Hummus. Tomato sauce. Olives. Mushrooms. Limit these Grains: Prepackaged pasta or rice dishes. Prepackaged cereal with added sugar. Vegetables: Deep fried potatoes (french fries). Fruits: Fruit canned in syrup. Meats and other protein foods: Beef. Pork. Lamb. Poultry with skin. Hot dogs. Tomasa Blase. Dairy: Ice cream. Sour cream. Whole milk. Beverages: Juice. Sugar-sweetened soft drinks. Beer. Liquor and spirits. Fats and oils: Butter. Canola oil. Vegetable oil. Beef fat (tallow). Lard. Sweets and desserts: Cookies. Cakes. Pies. Candy. Seasoning and other foods: Mayonnaise. Premade sauces and marinades. The items listed may not be a complete list. Talk with your dietitian about what dietary choices are right for you. Summary The Mediterranean diet includes both  food and lifestyle choices. Eat a variety of fresh fruits and vegetables, beans, nuts, seeds, and whole grains. Limit the amount of red meat and sweets that you eat. Talk with your health care provider about whether it is safe for you to drink red wine in moderation. This means 1 glass a day for nonpregnant women and 2 glasses a day for men. A glass of wine equals 5 oz (150 mL). This information is not intended to replace advice given to you by your health care provider. Make sure you discuss any questions you  have with your health care provider.     Labs today [address] [facility] [phone]

## 2023-07-30 ENCOUNTER — Other Ambulatory Visit (HOSPITAL_COMMUNITY)
Admission: RE | Admit: 2023-07-30 | Discharge: 2023-07-30 | Disposition: A | Discharge status: Home / Self Care | Payer: Medicare Other | Source: Ambulatory Visit | Attending: Nurse Practitioner | Admitting: Nurse Practitioner

## 2023-07-30 DIAGNOSIS — E890 Postprocedural hypothyroidism: Secondary | ICD-10-CM | POA: Insufficient documentation

## 2023-07-30 LAB — T4, FREE: Free T4: 1 ng/dL (ref 0.61–1.12)

## 2023-07-30 LAB — TSH: TSH: 0.62 u[IU]/mL (ref 0.350–4.500)

## 2023-08-04 NOTE — Patient Instructions (Signed)

## 2023-08-06 ENCOUNTER — Encounter: Payer: Self-pay | Admitting: Nurse Practitioner

## 2023-08-06 ENCOUNTER — Ambulatory Visit (INDEPENDENT_AMBULATORY_CARE_PROVIDER_SITE_OTHER): Payer: Medicare Other | Admitting: Nurse Practitioner

## 2023-08-06 VITALS — BP 110/72 | HR 100 | Ht 61.0 in | Wt 144.2 lb

## 2023-08-06 DIAGNOSIS — E890 Postprocedural hypothyroidism: Secondary | ICD-10-CM | POA: Diagnosis not present

## 2023-08-06 NOTE — Progress Notes (Signed)
08/06/2023, 11:27 AM     Endocrinology follow-up note    Subjective:    Patient ID: Andrea Shannon, female    DOB: 12-14-1945, PCP Rakes, Doralee Albino, FNP   Past Medical History:  Diagnosis Date   Acid reflux    Arthritis    Diabetes mellitus, type II (HCC)    Hypertension    Hyperthyroidism    Hypothyroidism    Past Surgical History:  Procedure Laterality Date   ABDOMINAL HYSTERECTOMY     Social History   Socioeconomic History   Marital status: Widowed    Spouse name: Not on file   Number of children: 1   Years of education: 12   Highest education level: High school graduate  Occupational History   Not on file  Tobacco Use   Smoking status: Never   Smokeless tobacco: Never  Vaping Use   Vaping status: Never Used  Substance and Sexual Activity   Alcohol use: Not Currently   Drug use: Never   Sexual activity: Not Currently    Birth control/protection: Surgical  Other Topics Concern   Not on file  Social History Narrative   Right handed   Drinks caffeine coffee   Lives alone   Retired   One son   Social Determinants of Health   Financial Resource Strain: Low Risk  (01/06/2023)   Overall Financial Resource Strain (CARDIA)    Difficulty of Paying Living Expenses: Not hard at all  Food Insecurity: No Food Insecurity (01/06/2023)   Hunger Vital Sign    Worried About Running Out of Food in the Last Year: Never true    Ran Out of Food in the Last Year: Never true  Transportation Needs: No Transportation Needs (01/06/2023)   PRAPARE - Administrator, Civil Service (Medical): No    Lack of Transportation (Non-Medical): No  Physical Activity: Insufficiently Active (01/06/2023)   Exercise Vital Sign    Days of Exercise per Week: 3 days    Minutes of Exercise per Session: 30 min  Stress: No Stress Concern Present (01/06/2023)   [employer] of Occupational Health - Occupational Stress  Questionnaire    Feeling of Stress : Not at all  Social Connections: Moderately Integrated (01/06/2023)   Social Connection and Isolation Panel [NHANES]    Frequency of Communication with Friends and Family: More than three times a week    Frequency of Social Gatherings with Friends and Family: More than three times a week    Attends Religious Services: More than 4 times per year    Active Member of [employer] or Organizations: Yes    Attends Banker Meetings: More than 4 times per year    Marital Status: Widowed   Outpatient Encounter Medications as of 08/06/2023  Medication Sig   aspirin 81 MG chewable tablet Chew by mouth daily.   diclofenac (VOLTAREN) 75 MG EC tablet Take 1 tablet (75 mg total) by mouth 2 (two) times daily.   DULoxetine (CYMBALTA) 20 MG capsule Take 1 capsule (20 mg total) by mouth daily.   hydrOXYzine (ATARAX) 10 MG tablet Take 1 tablet (10 mg total) by mouth 3 (three) times daily as needed for anxiety.   levothyroxine (  SYNTHROID) 125 MCG tablet TAKE 1/2 TABLET BY MOUTH EVERY MORNING BEFORE BREAKFAST   lisinopril-hydrochlorothiazide (ZESTORETIC) 20-12.5 MG tablet Take 1 tablet by mouth daily.   metFORMIN (GLUCOPHAGE) 500 MG tablet Take 1 tablet (500 mg total) by mouth 2 (two) times daily with a meal. TAKE 1 TABLET BY MOUTH TWICE A DAY WITH A MEAL   pantoprazole (PROTONIX) 40 MG tablet Take 1 tablet (40 mg total) by mouth daily.   sucralfate (CARAFATE) 1 g tablet Take 1 tablet (1 g total) by mouth 2 (two) times daily.   No facility-administered encounter medications on file as of 08/06/2023.   ALLERGIES: No Known Allergies  VACCINATION STATUS:  There is no immunization history on file for this patient.  Thyroid Problem Presents for follow-up (-She was treated with I-131 on September 29, 2019 for toxic multinodular goiter.   She reports family history of thyroid dysfunction, family history of thyroid malignancy.) visit. Symptoms include heat intolerance.  Patient reports no anxiety, cold intolerance, constipation, depressed mood, diarrhea, fatigue, leg swelling, palpitations, tremors, weight gain or weight loss. (Says recently she has been excessively sweating, hot flashes, even without doing anything to exert such.  She notes night sweats as well.  She did tell me she is trying to use up her supply of 50 mcg pills of her Levothyroxine, so has been splitting tablets to equal the dose she needs and when it splits, it does not split evenly.) The symptoms have been stable.     Andrea Shannon is 77 y.o. female who presents today with a medical history as above.  Review of systems  Constitutional: + Minimally fluctuating body weight,  current Body mass index is 27.25 kg/m. , no fatigue, no subjective hyperthermia, no subjective hypothermia Eyes: no blurry vision, no xerophthalmia ENT: no sore throat, no nodules palpated in throat, no dysphagia/odynophagia, no hoarseness Cardiovascular: no chest pain, no shortness of breath, no palpitations, no leg swelling Respiratory: no cough, no shortness of breath Gastrointestinal: no nausea/vomiting/diarrhea Musculoskeletal: no muscle/joint aches Skin: no rashes, no hyperemia Neurological: no tremors, no numbness, no tingling, no dizziness Psychiatric: no depression, no anxiety  Objective:    BP 110/72 (BP Location: Left Arm, Patient Position: Sitting, Cuff Size: Large)   Pulse 100   Ht 5\' 1"  (1.549 m)   Wt 144 lb 3.2 oz (65.4 kg)   BMI 27.25 kg/m   Wt Readings from Last 3 Encounters:  08/06/23 144 lb 3.2 oz (65.4 kg)  07/29/23 145 lb (65.8 kg)  06/10/23 145 lb 9.6 oz (66 kg)     BP Readings from Last 3 Encounters:  08/06/23 110/72  07/29/23 113/72  06/10/23 (!) 122/57     Physical Exam- Limited  Constitutional:  Body mass index is 27.25 kg/m. , not in acute distress, normal state of mind Eyes:  EOMI, no exophthalmos Musculoskeletal: no gross deformities, strength intact in all four  extremities, no gross restriction of joint movements Skin:  no rashes, no hyperemia Neurological: no tremor with outstretched hands   Recent Results (from the past 2160 hour(s))  HgB A1c     Status: Abnormal   Collection Time: 05/14/23  2:57 PM  Result Value Ref Range   Hgb A1c MFr Bld 6.3 (H) <5.7 % of total Hgb    Comment: For someone without known diabetes, a hemoglobin  A1c value between 5.7% and 6.4% is consistent with prediabetes and should be confirmed with a  follow-up test. . For someone with known diabetes, a value <7% indicates  that their diabetes is well controlled. A1c targets should be individualized based on duration of diabetes, age, comorbid conditions, and other considerations. . This assay result is consistent with an increased risk of diabetes. . Currently, no consensus exists regarding use of hemoglobin A1c for diagnosis of diabetes for children. .    Mean Plasma Glucose 134 mg/dL   eAG (mmol/L) 7.4 mmol/L    Comment: . This test was performed on the Roche cobas c503 platform. Effective 07/27/22, a change in test platforms from the Abbott Architect to the Roche cobas c503 may have shifted HbA1c results compared to historical results. Based on laboratory validation testing conducted at [facility], the Roche platform relative to the Abbott platform had an average increase in HbA1c value of < or = 0.3%. This difference is within accepted  variability established by the [facility]. Note that not all individuals will have had a shift in their results and direct comparisons between historical and current results for testing conducted on different platforms is not recommended.   C-reactive protein     Status: Abnormal   Collection Time: 05/14/23  2:57 PM  Result Value Ref Range   CRP 10.6 (H) <8.0 mg/L  Sed Rate (ESR)     Status: None   Collection Time: 05/14/23  2:57 PM  Result Value Ref Range   Sed Rate 2 0 - 30 mm/h   Uric acid     Status: None   Collection Time: 05/14/23  2:57 PM  Result Value Ref Range   Uric Acid, Serum 6.3 2.5 - 7.0 mg/dL    Comment: Therapeutic target for gout patients: <6.0 mg/dL .   Vitamin B1     Status: None   Collection Time: 05/14/23  2:57 PM  Result Value Ref Range   Vitamin B1 (Thiamine) 15 8 - 30 nmol/L    Comment: (Note) Vitamin supplementation within 24 hours prior to blood  draw may affect the accuracy of the results. . This test was developed and its analytical performance  characteristics have been determined by [employer]. It has not been cleared or approved by FDA.  This assay has been validated pursuant to the CLIA  regulations and is used for clinical purposes. . [facility] [address] [place] [address] [phone] Junita Push L. Thompson Caul, MD, PhD   HIV 1 RNA quant-no reflex-bld     Status: None   Collection Time: 05/14/23  2:57 PM  Result Value Ref Range   HIV 1 RNA Quant Not Detected Copies/mL   HIV-1 RNA Quant, Log Not Detected Log cps/mL    Comment: . Reference Range:                           Not Detected     copies/mL                           Not Detected Log copies/mL . Marland Kitchen The test was performed using Real-Time Polymerase Chain Reaction. . . Reportable Range: 20 copies/mL to 10,000,000 copies/mL (1.30 Log copies/mL to 7.00 Log copies/mL). Marland Kitchen   Copper, serum     Status: None   Collection Time: 05/14/23  2:57 PM  Result Value Ref Range   Copper 115 70 - 175 mcg/dL    Comment: (Note) This test was developed and its analytical performance characteristics have been determined by [facility]. It has  not been cleared or approved by the FDA. This assay has been validated pursuant to the CLIA regulations and is used for clinical purposes. . [facility] [address] [place] [address] [phone] Junita Push L. Thompson Caul, MD, PhD   Protein  electrophoresis, serum     Status: None   Collection Time: 05/14/23  2:57 PM  Result Value Ref Range   Total Protein 7.1 6.1 - 8.1 g/dL   Albumin ELP 4.6 3.8 - 4.8 g/dL   Alpha 1 0.3 0.2 - 0.3 g/dL   Alpha 2 0.5 0.5 - 0.9 g/dL   Beta Globulin 0.5 0.4 - 0.6 g/dL   Beta 2 0.4 0.2 - 0.5 g/dL   Gamma Globulin 0.9 0.8 - 1.7 g/dL   SPE Interp.      Comment: Normal Serum Protein Electrophoresis Pattern. No abnormal protein bands (M-protein) detected.   ANA     Status: None   Collection Time: 05/14/23  2:57 PM  Result Value Ref Range   Anti Nuclear Antibody (ANA) NEGATIVE NEGATIVE    Comment: ANA IFA is a first line screen for detecting the presence of up to approximately 150 autoantibodies in various autoimmune diseases. A negative ANA IFA result suggests an ANA-associated autoimmune disease is not present at this time, but is not definitive. If there is high clinical suspicion for Sjogren's syndrome, testing for anti-SS-A/Ro antibody should be considered. Anti-Jo-1 antibody should be considered for clinically suspected inflammatory myopathies. . AC-0: Negative . International Consensus on ANA Patterns (SeverTies.uy) . For additional information, please refer to http://education.[facility].com/faq/FAQ177 (This link is being provided for informational/ educational purposes only.) .   Vitamin B6     Status: Abnormal   Collection Time: 05/14/23  2:57 PM  Result Value Ref Range   Vitamin B6 32.0 (H) 2.1 - 21.7 ng/mL    Comment: (Note) Vitamin supplementation within 24 hours prior to blood  draw may affect the accuracy of results. . This test was developed and its analytical performance  characteristics have been determined by [employer]. It has not been cleared or approved by the  FDA. This assay has been validated pursuant to the CLIA  regulations and is used for clinical purposes. . [facility] [address] [place] [address] [phone] Junita Push L. Thompson Caul, MD, PhD   Lyme Disease, Western Blot     Status: Abnormal   Collection Time: 05/14/23  2:57 PM  Result Value Ref Range     IgG P93 Ab. Absent      IgG P66 Ab. Absent      IgG P58 Ab. Present (A)      IgG P45 Ab. Absent      IgG P41 Ab. Present (A)      IgG P39 Ab. Absent      IgG P30 Ab. Absent      IgG P28 Ab. Absent      IgG P23 Ab. Absent      IgG P18 Ab. Absent    Lyme IgG Wb Negative     Comment:                      Positive: 5 of the following                                Borrelia-specific bands:  18,23,28,30,39,41,45,58,                                66, and 93.                      Negative: No bands or banding                                patterns which do not                                meet positive criteria.      IgM P41 Ab. Absent      IgM P39 Ab. Absent      IgM P23 Ab. Absent    Lyme IgM Wb Negative     Comment: Note: An equivocal or positive EIA result followed by a negative Line Blot result is considered NEGATIVE. An equivocal or positive EIA result followed by a positive Line Blot is considered POSITIVE by the CDC. Positive: 2 of the following bands: 23,39 or 41 Negative: No bands or banding patterns which do not meet positive criteria. Criteria for positivity are those recommended by CDC/ASTPHLD. p23=Osp C, p41=flagellin Note: Sera from individuals with the following may cross react in the Lyme Line Blot assays: other spirochetal diseases (periodontal disease, leptospirosis, relapsing fever, yaws, and pinta); connective autoimmune (Rheumatoid Arthritis and Systemic Lupus Erythematosus and also individuals with Antinuclear Antibody); other infections [facility] Spotted Fever; Epstein-Barr Virus, and Cytomegalovirus). Please Note: Lyme immunoblot alone is not recommended for the diagnosis of Lyme disease. Current guidelines recommend the use of a  two-tiered approach t o Lyme serology testing to improve the sensitivity and specificity of testing. [facility] offers test code [phone] Lyme Disease Serology with Reflex to aid in the diagnosis of Lyme Disease.   BMP8+EGFR     Status: Abnormal   Collection Time: 06/10/23 10:47 AM  Result Value Ref Range   Glucose 62 (L) 70 - 99 mg/dL   BUN 16 8 - 27 mg/dL   Creatinine, Ser 7.56 (H) 0.57 - 1.00 mg/dL   eGFR 51 (L) >43 PI/RJJ/8.84   BUN/Creatinine Ratio 14 12 - 28   Sodium 141 134 - 144 mmol/L   Potassium 4.1 3.5 - 5.2 mmol/L   Chloride 100 96 - 106 mmol/L   CO2 23 20 - 29 mmol/L   Calcium 10.0 8.7 - 10.3 mg/dL  CBC with Differential/Platelet     Status: Abnormal   Collection Time: 06/10/23 10:47 AM  Result Value Ref Range   WBC 5.6 3.4 - 10.8 x10E3/uL   RBC 4.37 3.77 - 5.28 x10E6/uL   Hemoglobin 10.4 (L) 11.1 - 15.9 g/dL   Hematocrit 16.6 (L) 06.3 - 46.6 %   MCV 76 (L) 79 - 97 fL   MCH 23.8 (L) 26.6 - 33.0 pg   MCHC 31.1 (L) 31.5 - 35.7 g/dL   RDW 01.6 (H) 01.0 - 93.2 %   Platelets 497 (H) 150 - 450 x10E3/uL   Neutrophils 57 Not Estab. %   Lymphs 33 Not Estab. %   Monocytes 7 Not Estab. %   Eos 2 Not Estab. %   Basos 1 Not Estab. %   Neutrophils Absolute 3.2 1.4 - 7.0 x10E3/uL   Lymphocytes Absolute 1.8 0.7 - 3.1 x10E3/uL  Monocytes Absolute 0.4 0.1 - 0.9 x10E3/uL   EOS (ABSOLUTE) 0.1 0.0 - 0.4 x10E3/uL   Basophils Absolute 0.0 0.0 - 0.2 x10E3/uL   Immature Granulocytes 0 Not Estab. %   Immature Grans (Abs) 0.0 0.0 - 0.1 x10E3/uL  TSH     Status: None   Collection Time: 07/30/23  2:00 PM  Result Value Ref Range   TSH 0.620 0.350 - 4.500 uIU/mL    Comment: Performed by a 3rd Generation assay with a functional sensitivity of <=0.01 uIU/mL. Performed at [facility], [address] [place], [place] [address]   T4, free     Status: None   Collection Time: 07/30/23  2:01 PM  Result Value Ref Range   Free T4 1.00 0.61 - 1.12 ng/dL    Comment: (NOTE) Biotin ingestion may  interfere with free T4 tests. If the results are inconsistent with the TSH level, previous test results, or the clinical presentation, then consider biotin interference. If needed, order repeat testing after stopping biotin. Performed at [facility], [address] [place], [place] [address]    Ultrasound of the thyroid on June 27, 2018 showed right lobe measuring 4.9 x 1.9 x 2.1 cm with 1 dominant mixed cystic and solid nodule measuring 2.2 x 1.5 x 1.8 cm.  Another nodule in the superior right lobe measuring 1.9 x 0.9 x 1.2 cm. Left lobe heterogeneous measuring 4.7 x 3.2 x 2.3 cm with dominant mixed cystic and solid nodule in the central left lobe measuring 2.6 x 2.4 x 2.3 cm.  Separate mixed cystic and solid nodule in the superior left lobe measuring 1.7 x 1.3 x 1.4 cm. Impression:  innumerable thyroid nodules, increasing probability of benignity.  2 suspicious right thyroid nodule measuring 1.9 x 0.9 x 1.2 cm in the superior right lobe.  Ultrasound-guided FNA is suggested.  More suspicious left thyroid nodule in the superior left lobe measuring 2.6 x 2.4 x 2.3 cm.  Ultrasound-guided FNA suggested.   Fine-needle aspiration on November 30, 2018 revealed atypia of undetermined significance,  afirma testing-reported benign, with malignancy risk less than 4%.     Thyroid uptake and scan on September 04, 2019 showed enlarged thyroid gland with several focal areas of increased uptake, likely representing nodules.  No areas of photopenia.  24-hour uptake 35%.     US Thyroid 11/15/2018 Narrative & Impression  CLINICAL DATA:  Multinodular goiter   EXAM: THYROID ULTRASOUND   TECHNIQUE: Ultrasound examination of the thyroid gland and adjacent soft tissues was performed.   COMPARISON:  06/27/2018 by report only from [facility]   FINDINGS: Parenchymal Echotexture: Moderately heterogenous   Isthmus: 0.3 cm thickness, stable   Right lobe: 5.6 x 2.3 x 2 cm, previously  4.9 x 1.9 x 2.1   Left lobe: 6.1 x 3.3 x 2.3 cm, previously 4.7 x 3.2 x 2.3   _________________________________________________________   Estimated total number of nodules >/= 1 cm: 6-10   Number of spongiform nodules >/=  2 cm not described below (TR1): 0   Number of mixed cystic and solid nodules >/= 1.5 cm not described below (TR2): 0   _________________________________________________________   Nodule # 1:   Location: Right; Superior   Maximum size: 1.4 cm; Other 2 dimensions: 1.1 x 1 cm   Composition: mixed cystic and solid (1)   Echogenicity: hypoechoic (2)   Shape: not taller-than-wide (0)   Margins: ill-defined (0)   Echogenic foci: none (0)   ACR TI-RADS total  points: 3.   ACR TI-RADS risk category: TR3 (3 points).   ACR TI-RADS recommendations:   Given size (<1.4 cm) and appearance, this nodule does NOT meet TI-RADS criteria for biopsy or dedicated follow-up.   _________________________________________________________   Nodule # 2: 2.7 x 1.9 x 2 cm spongiform nodule, mid right; This nodule does NOT meet TI-RADS criteria for biopsy or dedicated follow-up.   Nodule # 3: 1.5 x 0.9 x 1.2 cm spongiform nodule, inferior right; This nodule does NOT meet TI-RADS criteria for biopsy or dedicated follow-up.   Nodule # 4:   Location: Left; Superior   Maximum size: 2 cm; Other 2 dimensions: 1.7 x 1.7 cm   Composition: mixed cystic and solid (1)   Echogenicity: hypoechoic (2)   Shape: not taller-than-wide (0)   Margins: smooth (0)   Echogenic foci: none (0)   ACR TI-RADS total points: 3.   ACR TI-RADS risk category: TR3 (3 points).   ACR TI-RADS recommendations:   *Given size (>/= 1.5 - 2.4 cm) and appearance, a follow-up ultrasound in 1 year should be considered based on TI-RADS criteria.   _________________________________________________________   Nodule # 5:   Location: Left; Mid   Maximum size: 3 cm; Other 2 dimensions: 2.8 x 1.9 cm    Composition: mixed cystic and solid (1)   Echogenicity: isoechoic (1)   Shape: not taller-than-wide (0)   Margins: smooth (0)   Echogenic foci: none (0)   ACR TI-RADS total points: 2.   ACR TI-RADS risk category: TR2 (2 points).   ACR TI-RADS recommendations:   This nodule does NOT meet TI-RADS criteria for biopsy or dedicated follow-up.   _________________________________________________________   Nodule # 6:   Location: Left; Inferior   Maximum size: 3.2 cm; Other 2 dimensions: 2.7 x 1.9 cm   Composition: mixed cystic and solid (1)   Echogenicity: isoechoic (1)   Shape: not taller-than-wide (0)   Margins: smooth (0)   Echogenic foci: none (0)   ACR TI-RADS total points: 2.   ACR TI-RADS risk category: TR2 (2 points).   ACR TI-RADS recommendations:   This nodule does NOT meet TI-RADS criteria for biopsy or dedicated follow-up.   _________________________________________________________   Nodule # 7:   Location: Left; Inferior posterior   Maximum size: 3 cm; Other 2 dimensions: 2.9 x 2.3 cm   Composition: solid/almost completely solid (2)   Echogenicity: isoechoic (1)   Shape: taller-than-wide (3)   Margins: ill-defined (0)   Echogenic foci: none (0)   ACR TI-RADS total points: 6.   ACR TI-RADS risk category: TR4 (4-6 points).   ACR TI-RADS recommendations:   **Given size (>/= 1.5 cm) and appearance, fine needle aspiration of this moderately suspicious nodule should be considered based on TI-RADS criteria.   IMPRESSION: 1. Thyromegaly with bilateral nodules. 2. Recommend FNA biopsy of moderately suspicious 3 cm inferior posterior nodule #7; This was not described on the prior study. 3. Recommend annual/biennial ultrasound follow-up of additional lesion as above, until stability x5 years confirmed.   -------------------------------------------------------------------------------------------------------------------------------  FOLLOW  UP THYROID US FROM 12/02/20 CLINICAL DATA:  Hypothyroidism. Left inferior thyroid nodule biopsy on 11/30/2018.   EXAM: THYROID ULTRASOUND   TECHNIQUE: Ultrasound examination of the thyroid gland and adjacent soft tissues was performed.   COMPARISON:  11/15/2018   FINDINGS: Parenchymal Echotexture: Moderately heterogenous   Isthmus: 0.2 cm, previously 0.3 cm   Right lobe: 4.9 x 1.7 x 1.6 cm, previously 5.6 x 2.3 x 2.0 cm   Left  lobe: 5.1 x 2.2 x 1.7 cm, previously 6.1 x 2.8 x 2.3 cm   _________________________________________________________   Estimated total number of nodules >/= 1 cm: 5   Number of spongiform nodules >/=  2 cm not described below (TR1): 0   Number of mixed cystic and solid nodules >/= 1.5 cm not described below (TR2): 0   _________________________________________________________   Again noted are multiple right thyroid nodules. The right thyroid nodules are predominantly cystic or spongiform.   Nodule 2 in the right mid thyroid lobe has markedly decreased in size. This is a spongiform or mixed cystic and solid nodule that measures 1.3 x 0.9 x 0.9 cm and previously measured 2.7 x 1.9 x 2.0 cm.   Nodule 3 in the right inferior thyroid is mixed cystic and solid nodule that measures roughly 1.5 x 1.0 x 1.2 cm and previously measured 1.3 x 1.2 x 0.9 cm.   Multiple left thyroid nodules that are predominantly mixed cystic and solid nodules.   Nodule 5 in the left mid thyroid lobe measures 1.3 x 0.5 x 0.5 cm and previously measured 2.8 x 1.9 x 3.0 cm. This is a mixed cystic and solid nodule and the solid portion is isoechoic.   Nodule 6 is a mixed cystic and solid nodule in the left inferior thyroid lobe measures 1.6 x 0.9 x 1.5 cm and previously measured 2.7 x 1.9 x 3.2 cm.   Nodule 7 in the left inferior thyroid lobe represents the previously biopsied nodule. This nodule is slightly hypoechoic and has scattered cystic areas. This nodule measures  2.1 x 2.0 x 1.5 cm and previously measured 3.0 x 2.9 x 2.3 cm.   IMPRESSION: 1. Multinodular goiter. 2. Most of the nodules are mixed cystic and solid composition. Many of the nodules have decreased in size as described. 3. No new suspicious thyroid nodules.   The above is in keeping with the ACR TI-RADS recommendations - J Am Coll Radiol 2017;14:587-595.     Electronically Signed   By: Andrea Overlie M.D.   On: 12/02/2020 14:20   Latest Reference Range & Units 12/02/22 10:07 01/13/23 11:46 01/13/23 11:56 04/29/23 12:54 07/30/23 14:00 07/30/23 14:01  TSH 0.350 - 4.500 uIU/mL 0.542 1.112  0.327 (L) 0.620   T4,Free(Direct) 0.61 - 1.12 ng/dL   1.61 0.96 (H)  0.45  Thyroxine (T4) 4.5 - 12.0 ug/dL 8.4       Free Thyroxine Index 1.2 - 4.9  2.4       T3 Uptake Ratio 24 - 39 % 29       (L): Data is abnormally low (H): Data is abnormally high  Assessment & Plan:   1. RAI - induced hypothyroidism -She status post I-131 thyroid ablation on September 29, 2019.    -Her previsit TFTs are consistent with appropriate hormone replacement.  She is advised to continue her Levothyroxine 62.5 mcg po daily before breakfast (1/2 of a 125 mcg tab).  75 mcg is too much, 50 mcg was not enough.   - We discussed about the correct intake of her thyroid hormone, on empty stomach at fasting, with water, separated by at least 30 minutes from breakfast and other medications,  and separated by more than 4 hours from calcium, iron, multivitamins, acid reflux medications (PPIs). -Patient is made aware of the fact that thyroid hormone replacement is needed for life, dose to be adjusted by periodic monitoring of thyroid function tests.  2.  Multinodular goiter -Her fine-needle aspiration was significant  for atypia of undetermined significance, a sample was sent for afirma testing-with grossly benign finding with malignancy is less than 4%.  She will not need surgical intervention at this time.    -Her thyroid US from  10/2018 shows multiple nodules which recommended follow up with [place] in 1 year. Her repeat thyroid US from 11/2020 shows that all nodules have decreased in size and there are no new nodules.  No additional surveillance recommended at this time.    - I advised her  to maintain close follow up with Rakes, Doralee Albino, FNP for primary care needs.       I spent  12  minutes in the care of the patient today including review of labs from Thyroid Function, CMP, and other relevant labs ; imaging/biopsy records (current and previous including abstractions from other facilities); face-to-face time discussing  her lab results and symptoms, medications doses, her options of short and long term treatment based on the latest standards of care / guidelines;   and documenting the encounter.  [name]  participated in the discussions, expressed understanding, and voiced agreement with the above plans.  All questions were answered to her satisfaction. she is encouraged to contact clinic should she have any questions or concerns prior to her return visit.  Follow up plan: Return in about 4 months (around 12/07/2023) for Thyroid follow up, Previsit labs.   Ronny Bacon, [facility] [place] [facility] [address] [place], [place] [address] Phone: [phone] Fax: [phone]   08/06/2023, 11:27 AM

## 2023-08-17 ENCOUNTER — Telehealth: Payer: Self-pay | Admitting: Family Medicine

## 2023-08-17 DIAGNOSIS — E119 Type 2 diabetes mellitus without complications: Secondary | ICD-10-CM

## 2023-08-17 MED ORDER — METFORMIN HCL 500 MG PO TABS: 500.0000 mg | ORAL_TABLET | Freq: Two times a day (BID) | ORAL | 0 refills | Status: DC

## 2023-08-17 NOTE — Telephone Encounter (Signed)
Medication sent.  Left patient detailed message rx requested has been sent.

## 2023-08-17 NOTE — Telephone Encounter (Signed)
  Prescription Request  08/17/2023  Is this a "Controlled Substance" medicine? metFORMIN (GLUCOPHAGE) 500 MG tablet   Have you seen your PCP in the last 2 weeks? no  If YES, route message to pool  -  If NO, patient needs to be scheduled for appointment.  What is the name of the medication or equipment? metFORMIN (GLUCOPHAGE) 500 MG tablet   Have you contacted your pharmacy to request a refill? New rx   Which pharmacy would you like this sent to? Fax to [facility]   Patient notified that their request is being sent to the clinical staff for review and that they should receive a response within 2 business days.

## 2023-08-25 DIAGNOSIS — H401131 Primary open-angle glaucoma, bilateral, mild stage: Secondary | ICD-10-CM | POA: Diagnosis not present

## 2023-08-25 DIAGNOSIS — H43812 Vitreous degeneration, left eye: Secondary | ICD-10-CM | POA: Diagnosis not present

## 2023-08-25 DIAGNOSIS — E119 Type 2 diabetes mellitus without complications: Secondary | ICD-10-CM | POA: Diagnosis not present

## 2023-08-25 DIAGNOSIS — H04123 Dry eye syndrome of bilateral lacrimal glands: Secondary | ICD-10-CM | POA: Diagnosis not present

## 2023-08-25 DIAGNOSIS — H2513 Age-related nuclear cataract, bilateral: Secondary | ICD-10-CM | POA: Diagnosis not present

## 2023-08-30 ENCOUNTER — Encounter: Payer: Medicare Other | Admitting: Nurse Practitioner

## 2023-09-01 ENCOUNTER — Encounter: Payer: Medicare Other | Admitting: Family Medicine

## 2023-09-13 ENCOUNTER — Other Ambulatory Visit: Payer: Self-pay | Admitting: Physician Assistant

## 2023-09-13 ENCOUNTER — Telehealth: Payer: Self-pay | Admitting: Physician Assistant

## 2023-09-13 DIAGNOSIS — M5416 Radiculopathy, lumbar region: Secondary | ICD-10-CM | POA: Diagnosis not present

## 2023-09-13 DIAGNOSIS — Z6825 Body mass index (BMI) 25.0-25.9, adult: Secondary | ICD-10-CM | POA: Diagnosis not present

## 2023-09-13 MED ORDER — DULOXETINE HCL 30 MG PO CPEP: 30.0000 mg | ORAL_CAPSULE | Freq: Every day | ORAL | 3 refills | Status: DC

## 2023-09-13 NOTE — Telephone Encounter (Signed)
I left message to call office to see what medication she is currently wanting to increase.

## 2023-09-13 NOTE — Telephone Encounter (Signed)
Pt called and medication is DULoxetine (CYMBALTA) 20 MG capsule

## 2023-09-13 NOTE — Telephone Encounter (Signed)
Pt called in and left a message with the access nurse. She is wanting to speak with someone. She was given pills for her feet. The were working in the beginning, but now she states it is not working. Pt would like to increase her dosage.

## 2023-09-14 NOTE — Telephone Encounter (Signed)
I notified patient that new Rx was placed, she thanked me for calling.

## 2023-09-20 ENCOUNTER — Ambulatory Visit: Payer: Medicare Other | Admitting: Nurse Practitioner

## 2023-09-22 ENCOUNTER — Other Ambulatory Visit: Payer: Self-pay

## 2023-09-22 ENCOUNTER — Ambulatory Visit: Payer: Medicare Other | Attending: Neurosurgery

## 2023-09-22 DIAGNOSIS — M6281 Muscle weakness (generalized): Secondary | ICD-10-CM | POA: Diagnosis not present

## 2023-09-22 DIAGNOSIS — M5459 Other low back pain: Secondary | ICD-10-CM | POA: Diagnosis not present

## 2023-09-22 NOTE — Therapy (Signed)
OUTPATIENT PHYSICAL THERAPY THORACOLUMBAR EVALUATION   Patient Name: Andrea Shannon MRN: [id number] DOB:[birth year], 77 y.o., female Today's Date: 09/22/2023  END OF SESSION:  PT End of Session - 09/22/23 1028     Visit Number 1    Number of Visits 6    Date for PT Re-Evaluation 11/19/23    PT Start Time 1100    PT Stop Time 1147    PT Time Calculation (min) 47 min    Activity Tolerance Patient tolerated treatment well    Behavior During Therapy [facility] for tasks assessed/performed             Past Medical History:  Diagnosis Date   Acid reflux    Arthritis    Diabetes mellitus, type II (HCC)    Hypertension    Hyperthyroidism    Hypothyroidism    Past Surgical History:  Procedure Laterality Date   ABDOMINAL HYSTERECTOMY     Patient Active Problem List   Diagnosis Date Noted   Chronic pain of multiple joints 12/02/2022   Acquired hypothyroidism 04/24/2022   Gastroesophageal reflux disease without esophagitis 04/22/2022   Anxiety 04/22/2022   Diabetes mellitus type 2 with complications (HCC) 10/21/2021   Hypertension associated with diabetes (HCC) 10/21/2021   OAB (overactive bladder) 04/15/2020   Toxic multinodul goiter 09/26/2018    PCP: Sonny Masters, FNP  REFERRING PROVIDER: Bedelia Person, MD   REFERRING DIAG: Radiculopathy, lumbar region   Rationale for Evaluation and Treatment: Rehabilitation  THERAPY DIAG:  Other low back pain  Muscle weakness (generalized)  ONSET DATE: January 2024  SUBJECTIVE:                                                                                                                                                                                           SUBJECTIVE STATEMENT: Patient reports that she began having back and both leg pain in January 2024. She feels that her left leg is worse than her right. She is unsure what caused her pain to start, but it has been better recently. She knows that some of her pain is  arthritis as it is worse in the morning and when it's cold, but it gets better when she starts moving. She was given a new medication by her physician for her feet and she thinks that is helping her back some. She walks regularly (2x per week) for at least 1 hour. However, she feels that her balance is not good and she will lose her balance. She has not fallen though. She can also get cramps really bad in both legs, but she will use mustard which will really  help.   PERTINENT HISTORY:  Hypertension, diabetes type 2, anxiety, and arthritis  PAIN:  Are you having pain? Yes: NPRS scale: 1-2/10 Pain location: low back and both legs Pain description: intermittent  Aggravating factors: getting up in the morning and cold weather Relieving factors: medication  PRECAUTIONS: None  RED FLAGS: None   WEIGHT BEARING RESTRICTIONS: No  FALLS:  Has patient fallen in last 6 months? No  LIVING ENVIRONMENT: Lives with: lives alone Lives in: House/apartment Stairs: Yes: Internal: 12 steps; can reach both; reciprocal pattern, always uses both rails Has following equipment at home: None  OCCUPATION: retired  PLOF: Independent  PATIENT GOALS: reduced pain   NEXT MD VISIT: 11/15/23  OBJECTIVE:  Note: Objective measures were completed at Evaluation unless otherwise noted.  DIAGNOSTIC FINDINGS: 07/19/23 lumbar MRI IMPRESSION: 1. Progressive advanced facet hypertrophy and grade 1 anterolisthesis at L4-5 with progressive uncovering of a broad-based disc protrusion. This leads to progressive moderate central canal stenosis with left greater than right subarticular stenosis. 2. Mild to moderate foraminal narrowing bilaterally at L4-5. 3. Shallow central disc protrusion at L5-S1 potentially contacts the traversing S1 nerve roots bilaterally. 4. New leftward disc protrusion at L3-4 extends into the left neural foramen without significant focal stenosis.  SCREENING FOR RED FLAGS: Bowel or bladder  incontinence: No Spinal tumors: No Cauda equina syndrome: No Compression fracture: No Abdominal aneurysm: No  COGNITION: Overall cognitive status: Within functional limits for tasks assessed     SENSATION: Light touch: Impaired  and reduced sensation in left S1 dermatome Patient reports that she has numbness in both legs.   POSTURE: forward head  PALPATION: TTP: L4 spinous process   LUMBAR JOINT MOBILITY:  L1-4: WFL and nonpainful   L4-5: WFL and painful   LUMBAR ROM:   AROM eval  Flexion WFL (able to touch her toes)  Extension 10  Right lateral flexion 28; familiar pain  Left lateral flexion 22  Right rotation 25% limitation  Left rotation 50% limitation   (Blank rows = not tested)  LOWER EXTREMITY MMT:    MMT Right eval Left eval  Hip flexion 4-/5 4-/5  Hip extension    Hip abduction    Hip adduction    Hip internal rotation    Hip external rotation    Knee flexion 4/5 4/5  Knee extension 4-/5 4/5  Ankle dorsiflexion 4/5 4/5  Ankle plantarflexion    Ankle inversion    Ankle eversion     (Blank rows = not tested)  FUNCTIONAL TESTS:  5 times sit to stand: 19.08 seconds without UE support Timed up and go (TUG): 9.72 seconds  GAIT: Assistive device utilized: None Level of assistance: Complete Independence Comments: no significant gait deviations observed  TODAY'S TREATMENT:                                                                                                                              DATE:  PATIENT EDUCATION:  Education details: plan of care, prognosis, objective findings, and goals for physical therapy Person educated: Patient Education method: Explanation Education comprehension: verbalized understanding  HOME EXERCISE PROGRAM:   ASSESSMENT:  CLINICAL IMPRESSION: Patient is a 77 y.o. female who was seen today for physical therapy evaluation and treatment for chronic low back pain. She presented with low pain severity and  irritability with right lumbar side bending and L4-5 joint mobility assessments reproducing her familiar symptoms. Recommend that she continue with skilled physical therapy to address her impairments to return to her prior level of function.    OBJECTIVE IMPAIRMENTS: decreased strength and pain.   ACTIVITY LIMITATIONS: locomotion level  PARTICIPATION LIMITATIONS: community activity  PERSONAL FACTORS: 3+ comorbidities: Hypertension, diabetes type 2, anxiety, and arthritis  are also affecting patient's functional outcome.   REHAB POTENTIAL: Good  CLINICAL DECISION MAKING: Stable/uncomplicated  EVALUATION COMPLEXITY: Low   GOALS: Goals reviewed with patient? Yes  LONG TERM GOALS: Target date: 10/20/23  Patient will be independent with her HEP.  Baseline:  Goal status: INITIAL  2.  Patient will improve her five time sit to stand time to 12 seconds or less for improved lower extremity power.  Baseline:  Goal status: INITIAL  3.  Patient will improve her bilateral hip flexor strength to at least 4/5 for improved lower extremity strength.  Baseline:  Goal status: INITIAL  PLAN:  PT FREQUENCY: 1-2x/week  PT DURATION: 4 weeks  PLANNED INTERVENTIONS: 97110-Therapeutic exercises, 97530- Therapeutic activity, [id number]- Neuromuscular re-education, 97535- Self Care, 91478- Manual therapy, 97014- Electrical stimulation (unattended), Patient/Family education, Balance training, Spinal mobilization, Cryotherapy, and Moist heat.  PLAN FOR NEXT SESSION: Recumbent bike, lumbar stabilization, lower trunk rotations, manual therapy, and modalities as needed   Granville Lewis, PT 09/22/2023, 12:32 PM

## 2023-09-27 ENCOUNTER — Ambulatory Visit: Payer: Medicare Other

## 2023-09-27 DIAGNOSIS — M5459 Other low back pain: Secondary | ICD-10-CM

## 2023-09-27 DIAGNOSIS — M6281 Muscle weakness (generalized): Secondary | ICD-10-CM

## 2023-09-27 NOTE — Therapy (Signed)
OUTPATIENT PHYSICAL THERAPY THORACOLUMBAR EVALUATION   Patient Name: Andrea Shannon MRN: [id number] DOB:Dec 01, 1945, 77 y.o., female Today's Date: 09/27/2023  END OF SESSION:  PT End of Session - 09/27/23 1315     Visit Number 2    Number of Visits 6    Date for PT Re-Evaluation 11/19/23    PT Start Time 1300    PT Stop Time 1345    PT Time Calculation (min) 45 min    Activity Tolerance Patient tolerated treatment well    Behavior During Therapy [facility] for tasks assessed/performed             Past Medical History:  Diagnosis Date   Acid reflux    Arthritis    Diabetes mellitus, type II (HCC)    Hypertension    Hyperthyroidism    Hypothyroidism    Past Surgical History:  Procedure Laterality Date   ABDOMINAL HYSTERECTOMY     Patient Active Problem List   Diagnosis Date Noted   Chronic pain of multiple joints 12/02/2022   Acquired hypothyroidism 04/24/2022   Gastroesophageal reflux disease without esophagitis 04/22/2022   Anxiety 04/22/2022   Diabetes mellitus type 2 with complications (HCC) 10/21/2021   Hypertension associated with diabetes (HCC) 10/21/2021   OAB (overactive bladder) 04/15/2020   Toxic multinodul goiter 09/26/2018    PCP: Sonny Masters, FNP  REFERRING PROVIDER: Bedelia Person, MD   REFERRING DIAG: Radiculopathy, lumbar region   Rationale for Evaluation and Treatment: Rehabilitation  THERAPY DIAG:  Other low back pain  Muscle weakness (generalized)  ONSET DATE: January 2024  SUBJECTIVE:                                                                                                                                                                                           SUBJECTIVE STATEMENT: Pt denies any pain today  PERTINENT HISTORY:  Hypertension, diabetes type 2, anxiety, and arthritis  PAIN:  Are you having pain? No  PRECAUTIONS: None  RED FLAGS: None   WEIGHT BEARING RESTRICTIONS: No  FALLS:  Has patient fallen  in last 6 months? No  LIVING ENVIRONMENT: Lives with: lives alone Lives in: House/apartment Stairs: Yes: Internal: 12 steps; can reach both; reciprocal pattern, always uses both rails Has following equipment at home: None  OCCUPATION: retired  PLOF: Independent  PATIENT GOALS: reduced pain   NEXT MD VISIT: 11/15/23  OBJECTIVE:  Note: Objective measures were completed at Evaluation unless otherwise noted.  DIAGNOSTIC FINDINGS: 07/19/23 lumbar MRI IMPRESSION: 1. Progressive advanced facet hypertrophy and grade 1 anterolisthesis at L4-5 with progressive uncovering of a broad-based disc protrusion. This leads to progressive  moderate central canal stenosis with left greater than right subarticular stenosis. 2. Mild to moderate foraminal narrowing bilaterally at L4-5. 3. Shallow central disc protrusion at L5-S1 potentially contacts the traversing S1 nerve roots bilaterally. 4. New leftward disc protrusion at L3-4 extends into the left neural foramen without significant focal stenosis.  SCREENING FOR RED FLAGS: Bowel or bladder incontinence: No Spinal tumors: No Cauda equina syndrome: No Compression fracture: No Abdominal aneurysm: No  COGNITION: Overall cognitive status: Within functional limits for tasks assessed     SENSATION: Light touch: Impaired  and reduced sensation in left S1 dermatome Patient reports that she has numbness in both legs.   POSTURE: forward head  PALPATION: TTP: L4 spinous process   LUMBAR JOINT MOBILITY:  L1-4: WFL and nonpainful   L4-5: WFL and painful   LUMBAR ROM:   AROM eval  Flexion WFL (able to touch her toes)  Extension 10  Right lateral flexion 28; familiar pain  Left lateral flexion 22  Right rotation 25% limitation  Left rotation 50% limitation   (Blank rows = not tested)  LOWER EXTREMITY MMT:    MMT Right eval Left eval  Hip flexion 4-/5 4-/5  Hip extension    Hip abduction    Hip adduction    Hip internal rotation     Hip external rotation    Knee flexion 4/5 4/5  Knee extension 4-/5 4/5  Ankle dorsiflexion 4/5 4/5  Ankle plantarflexion    Ankle inversion    Ankle eversion     (Blank rows = not tested)  FUNCTIONAL TESTS:  5 times sit to stand: 19.08 seconds without UE support Timed up and go (TUG): 9.72 seconds  GAIT: Assistive device utilized: None Level of assistance: Complete Independence Comments: no significant gait deviations observed  TODAY'S TREATMENT:                                                                                                                              DATE:                                       EXERCISE LOG  Exercise Repetitions and Resistance Comments  Recumbent Bike Lvl 1; seat 3 x 15 mins   Rockerboard 5 mins   NBOS Airex 3 reps x 30 secs   Tandem 4 reps x 30 secs   LAQs 2# x 20 reps bil   Seated Marches 2# x 20 reps bil   Seated Ham Curls Red x 20 reps bil    Blank cell = exercise not performed today   PATIENT EDUCATION:  Education details: plan of care, prognosis, objective findings, and goals for physical therapy Person educated: Patient Education method: Explanation Education comprehension: verbalized understanding  HOME EXERCISE PROGRAM:   ASSESSMENT:  CLINICAL IMPRESSION: Pt arrives for today's treatment session denying any pain.  Pt reports  having cramps frequently so pt monitored for cramping throughout session.  Pt able to tolerate introduction to low level recumbent bike without discomfort or cramping.  Pt introduced to several standing balance exercises today with pt most challenged by tandem stance.  Pt also instructed in seated exercises as well to increase strength and function.  Pt denied any pain and reported feeling "good" at completion of today's treatment session.  OBJECTIVE IMPAIRMENTS: decreased strength and pain.   ACTIVITY LIMITATIONS: locomotion level  PARTICIPATION LIMITATIONS: community activity  PERSONAL FACTORS:  3+ comorbidities: Hypertension, diabetes type 2, anxiety, and arthritis  are also affecting patient's functional outcome.   REHAB POTENTIAL: Good  CLINICAL DECISION MAKING: Stable/uncomplicated  EVALUATION COMPLEXITY: Low   GOALS: Goals reviewed with patient? Yes  LONG TERM GOALS: Target date: 10/20/23  Patient will be independent with her HEP.  Baseline:  Goal status: INITIAL  2.  Patient will improve her five time sit to stand time to 12 seconds or less for improved lower extremity power.  Baseline:  Goal status: INITIAL  3.  Patient will improve her bilateral hip flexor strength to at least 4/5 for improved lower extremity strength.  Baseline:  Goal status: INITIAL  PLAN:  PT FREQUENCY: 1-2x/week  PT DURATION: 4 weeks  PLANNED INTERVENTIONS: 97110-Therapeutic exercises, 97530- Therapeutic activity, [id number]- Neuromuscular re-education, 97535- Self Care, 16109- Manual therapy, 97014- Electrical stimulation (unattended), Patient/Family education, Balance training, Spinal mobilization, Cryotherapy, and Moist heat.  PLAN FOR NEXT SESSION: Recumbent bike, lumbar stabilization, lower trunk rotations, manual therapy, and modalities as needed   Newman Pies, PTA 09/27/2023, 1:55 PM

## 2023-10-04 ENCOUNTER — Ambulatory Visit: Payer: Medicare Other

## 2023-10-04 DIAGNOSIS — M5459 Other low back pain: Secondary | ICD-10-CM

## 2023-10-04 DIAGNOSIS — M6281 Muscle weakness (generalized): Secondary | ICD-10-CM | POA: Diagnosis not present

## 2023-10-04 NOTE — Therapy (Signed)
OUTPATIENT PHYSICAL THERAPY THORACOLUMBAR EVALUATION   Patient Name: Andrea Shannon MRN: [id number] DOB:July 16, 1946, 77 y.o., female Today's Date: 10/04/2023  END OF SESSION:  PT End of Session - 10/04/23 1303     Visit Number 3    Number of Visits 6    Date for PT Re-Evaluation 11/19/23    PT Start Time 1300    PT Stop Time 1345    PT Time Calculation (min) 45 min    Activity Tolerance Patient tolerated treatment well    Behavior During Therapy [facility] for tasks assessed/performed             Past Medical History:  Diagnosis Date   Acid reflux    Arthritis    Diabetes mellitus, type II (HCC)    Hypertension    Hyperthyroidism    Hypothyroidism    Past Surgical History:  Procedure Laterality Date   ABDOMINAL HYSTERECTOMY     Patient Active Problem List   Diagnosis Date Noted   Chronic pain of multiple joints 12/02/2022   Acquired hypothyroidism 04/24/2022   Gastroesophageal reflux disease without esophagitis 04/22/2022   Anxiety 04/22/2022   Diabetes mellitus type 2 with complications (HCC) 10/21/2021   Hypertension associated with diabetes (HCC) 10/21/2021   OAB (overactive bladder) 04/15/2020   Toxic multinodul goiter 09/26/2018    PCP: Sonny Masters, FNP  REFERRING PROVIDER: Bedelia Person, MD   REFERRING DIAG: Radiculopathy, lumbar region   Rationale for Evaluation and Treatment: Rehabilitation  THERAPY DIAG:  Other low back pain  Muscle weakness (generalized)  ONSET DATE: January 2024  SUBJECTIVE:                                                                                                                                                                                           SUBJECTIVE STATEMENT: Pt denies any pain today  PERTINENT HISTORY:  Hypertension, diabetes type 2, anxiety, and arthritis  PAIN:  Are you having pain? No  PRECAUTIONS: None  RED FLAGS: None   WEIGHT BEARING RESTRICTIONS: No  FALLS:  Has patient fallen  in last 6 months? No  LIVING ENVIRONMENT: Lives with: lives alone Lives in: House/apartment Stairs: Yes: Internal: 12 steps; can reach both; reciprocal pattern, always uses both rails Has following equipment at home: None  OCCUPATION: retired  PLOF: Independent  PATIENT GOALS: reduced pain   NEXT MD VISIT: 11/15/23  OBJECTIVE:  Note: Objective measures were completed at Evaluation unless otherwise noted.  DIAGNOSTIC FINDINGS: 07/19/23 lumbar MRI IMPRESSION: 1. Progressive advanced facet hypertrophy and grade 1 anterolisthesis at L4-5 with progressive uncovering of a broad-based disc protrusion. This leads to progressive  moderate central canal stenosis with left greater than right subarticular stenosis. 2. Mild to moderate foraminal narrowing bilaterally at L4-5. 3. Shallow central disc protrusion at L5-S1 potentially contacts the traversing S1 nerve roots bilaterally. 4. New leftward disc protrusion at L3-4 extends into the left neural foramen without significant focal stenosis.  SCREENING FOR RED FLAGS: Bowel or bladder incontinence: No Spinal tumors: No Cauda equina syndrome: No Compression fracture: No Abdominal aneurysm: No  COGNITION: Overall cognitive status: Within functional limits for tasks assessed     SENSATION: Light touch: Impaired  and reduced sensation in left S1 dermatome Patient reports that she has numbness in both legs.   POSTURE: forward head  PALPATION: TTP: L4 spinous process   LUMBAR JOINT MOBILITY:  L1-4: WFL and nonpainful   L4-5: WFL and painful   LUMBAR ROM:   AROM eval  Flexion WFL (able to touch her toes)  Extension 10  Right lateral flexion 28; familiar pain  Left lateral flexion 22  Right rotation 25% limitation  Left rotation 50% limitation   (Blank rows = not tested)  LOWER EXTREMITY MMT:    MMT Right eval Left eval  Hip flexion 4-/5 4-/5  Hip extension    Hip abduction    Hip adduction    Hip internal rotation     Hip external rotation    Knee flexion 4/5 4/5  Knee extension 4-/5 4/5  Ankle dorsiflexion 4/5 4/5  Ankle plantarflexion    Ankle inversion    Ankle eversion     (Blank rows = not tested)  FUNCTIONAL TESTS:  5 times sit to stand: 19.08 seconds without UE support Timed up and go (TUG): 9.72 seconds  GAIT: Assistive device utilized: None Level of assistance: Complete Independence Comments: no significant gait deviations observed  TODAY'S TREATMENT:                                                                                                                              DATE:                                       EXERCISE LOG  Exercise Repetitions and Resistance Comments  Recumbent Bike Lvl 2; seat 3 x 15 mins   Rockerboard 5 mins   Side-Stepping 4 mins   Tandem Gait 3 mins   NBOS    Tandem    LAQs 2# x 20 reps bil   Seated Marches 2# x 20 reps bil   Seated Ham Curls Red x 20 reps bil    Blank cell = exercise not performed today   PATIENT EDUCATION:  Education details: plan of care, prognosis, objective findings, and goals for physical therapy Person educated: Patient Education method: Explanation Education comprehension: verbalized understanding  HOME EXERCISE PROGRAM:   ASSESSMENT:  CLINICAL IMPRESSION: Pt arrives for today's treatment session denying any pain.  Pt reports increased soreness after last treatment session that lasted a couple days. Pt introduced tandem gait and side-stepping on Airex balance beam today with pt requiring intermittent BUE support for safety and stability.  Reviewed seated exercises performed at last treatment session with very minimal cues required for proper technique and posture.  Pt denied any pain at completion of today's treatment session.   OBJECTIVE IMPAIRMENTS: decreased strength and pain.   ACTIVITY LIMITATIONS: locomotion level  PARTICIPATION LIMITATIONS: community activity  PERSONAL FACTORS: 3+ comorbidities:  Hypertension, diabetes type 2, anxiety, and arthritis  are also affecting patient's functional outcome.   REHAB POTENTIAL: Good  CLINICAL DECISION MAKING: Stable/uncomplicated  EVALUATION COMPLEXITY: Low   GOALS: Goals reviewed with patient? Yes  LONG TERM GOALS: Target date: 10/20/23  Patient will be independent with her HEP.  Baseline:  Goal status: IN PROGRESS  2.  Patient will improve her five time sit to stand time to 12 seconds or less for improved lower extremity power.  Baseline:  Goal status: INITIAL  3.  Patient will improve her bilateral hip flexor strength to at least 4/5 for improved lower extremity strength.  Baseline:  Goal status: INITIAL  PLAN:  PT FREQUENCY: 1-2x/week  PT DURATION: 4 weeks  PLANNED INTERVENTIONS: 97110-Therapeutic exercises, 97530- Therapeutic activity, [id number]- Neuromuscular re-education, 97535- Self Care, 40981- Manual therapy, 97014- Electrical stimulation (unattended), Patient/Family education, Balance training, Spinal mobilization, Cryotherapy, and Moist heat.  PLAN FOR NEXT SESSION: Recumbent bike, lumbar stabilization, lower trunk rotations, manual therapy, and modalities as needed   Newman Pies, PTA 10/04/2023, 1:56 PM

## 2023-10-07 ENCOUNTER — Ambulatory Visit: Payer: Medicare Other | Admitting: Family Medicine

## 2023-10-07 ENCOUNTER — Encounter: Payer: Self-pay | Admitting: Family Medicine

## 2023-10-07 VITALS — BP 127/62 | HR 92 | Temp 97.5°F | Ht 61.0 in | Wt 145.0 lb

## 2023-10-07 DIAGNOSIS — I152 Hypertension secondary to endocrine disorders: Secondary | ICD-10-CM

## 2023-10-07 DIAGNOSIS — R202 Paresthesia of skin: Secondary | ICD-10-CM | POA: Diagnosis not present

## 2023-10-07 DIAGNOSIS — R413 Other amnesia: Secondary | ICD-10-CM

## 2023-10-07 DIAGNOSIS — E118 Type 2 diabetes mellitus with unspecified complications: Secondary | ICD-10-CM | POA: Diagnosis not present

## 2023-10-07 DIAGNOSIS — E039 Hypothyroidism, unspecified: Secondary | ICD-10-CM

## 2023-10-07 DIAGNOSIS — E1159 Type 2 diabetes mellitus with other circulatory complications: Secondary | ICD-10-CM

## 2023-10-07 DIAGNOSIS — M5416 Radiculopathy, lumbar region: Secondary | ICD-10-CM

## 2023-10-07 DIAGNOSIS — K219 Gastro-esophageal reflux disease without esophagitis: Secondary | ICD-10-CM | POA: Diagnosis not present

## 2023-10-07 DIAGNOSIS — Z7984 Long term (current) use of oral hypoglycemic drugs: Secondary | ICD-10-CM

## 2023-10-07 DIAGNOSIS — F418 Other specified anxiety disorders: Secondary | ICD-10-CM

## 2023-10-07 LAB — BAYER DCA HB A1C WAIVED: HB A1C (BAYER DCA - WAIVED): 5.5 % (ref 4.8–5.6)

## 2023-10-07 MED ORDER — LISINOPRIL-HYDROCHLOROTHIAZIDE 20-12.5 MG PO TABS: 1.0000 | ORAL_TABLET | Freq: Every day | ORAL | 1 refills | Status: DC

## 2023-10-07 MED ORDER — HYDROXYZINE HCL 10 MG PO TABS
10.0000 mg | ORAL_TABLET | Freq: Three times a day (TID) | ORAL | 1 refills | Status: DC | PRN
Start: 2023-10-07 — End: 2024-01-06

## 2023-10-07 MED ORDER — SUCRALFATE 1 G PO TABS: 1.0000 g | ORAL_TABLET | Freq: Two times a day (BID) | ORAL | 1 refills | Status: DC

## 2023-10-07 MED ORDER — METFORMIN HCL 500 MG PO TABS: 500.0000 mg | ORAL_TABLET | Freq: Two times a day (BID) | ORAL | 1 refills | Status: DC

## 2023-10-07 NOTE — Progress Notes (Signed)
Subjective:  Patient ID: Andrea Shannon, female    DOB: Jun 26, 1946, 77 y.o.   MRN: [id number]  Patient Care Team: Sonny Masters, FNP as PCP - General (Family Medicine) Babs Bertin, OD (Optometry) Dani Gobble, NP as Nurse Practitioner (Nurse Practitioner) Adam Phenix, DPM as Consulting Physician (Podiatry) Gwynneth Munson, Corrie Dandy (Neurology)   Chief Complaint:  Medical Management of Chronic Issues (Chronic check up )   HPI: Andrea Shannon is a 77 y.o. female presenting on 10/07/2023 for Medical Management of Chronic Issues (Chronic check up )   Discussed the use of AI scribe software for clinical note transcription with the patient, who gave verbal consent to proceed.  History of Present Illness   The patient, with a history of thyroid disease, diabetes, and lumbar pinched nerves, presents with ongoing lower back pain and numbness in the feet. Despite undergoing physical therapy for the past few weeks, the patient reports no significant improvement in their back pain. They note that wearing compression socks and a thigh-high compression garment provides some relief.  The patient has been experiencing numbness and cold sensations in their feet, which have slightly improved with the use of DUC medication. Initially, the patient was on a 20mg  dose, which was increased to 30mg  due to recurring foot pain. The patient has been on the increased dose for three days at the time of the conversation.  The patient also reports issues with memory, which prompted an MRI of the head. The results were normal, and the memory issues were attributed to aging. The patient has been experiencing leg swelling for the past couple of weeks, the cause of which is unclear.  The patient is on multiple medications, including metformin for diabetes, a blood pressure pill, a thyroid medication, an 81mg  dose of Erspin, diclofenac for joint inflammation, and a reflux medication. The patient has  expressed a desire to reduce their medication load if possible.  The patient has been taking iron supplements once a week to address anemia and has been experiencing some constipation as a side effect. They have also been taking magnesium for cramps, but stopped due to concerns about potential side effects.  Despite these health issues, the patient reports an overall improvement in their condition, with less pain in their legs and feet. They continue to stay active, walking twice a week, and are managing their diabetes well, with a recent A1C of 5.5.         10/07/2023   10:27 AM 06/10/2023   10:25 AM 03/10/2023   10:24 AM 01/06/2023   11:25 AM 12/02/2022    9:43 AM  Depression screen PHQ 2/9  Decreased Interest 0 0 0 0 0  Down, Depressed, Hopeless 0 0 0 0 0  PHQ - 2 Score 0 0 0 0 0  Altered sleeping 0 0 0  0  Tired, decreased energy 0 0 0  0  Change in appetite 0 0 0  0  Feeling bad or failure about yourself  0 0 0  0  Trouble concentrating 0 0 0  0  Moving slowly or fidgety/restless 0 0 0  0  Suicidal thoughts 0 0 0  0  PHQ-9 Score 0 0 0  0  Difficult doing work/chores Not difficult at all Not difficult at all Not difficult at all  Not difficult at all      10/07/2023   10:27 AM 06/10/2023   10:25 AM 03/10/2023   10:24 AM 12/02/2022  9:43 AM  GAD 7 : Generalized Anxiety Score  Nervous, Anxious, on Edge 0 0 0 0  Control/stop worrying 0 0 0 0  Worry too much - different things 0 0 0 0  Trouble relaxing 0 0 0 0  Restless 0 0 0 0  Easily annoyed or irritable 0 0 0 0  Afraid - awful might happen 0 0 0 0  Total GAD 7 Score 0 0 0 0  Anxiety Difficulty Not difficult at all Not difficult at all Not difficult at all Not difficult at all        Relevant past medical, surgical, family, and social history reviewed and updated as indicated.  Allergies and medications reviewed and updated. Data reviewed: Chart in Epic.   Past Medical History:  Diagnosis Date   Acid reflux     Arthritis    Diabetes mellitus, type II (HCC)    Hypertension    Hyperthyroidism    Hypothyroidism     Past Surgical History:  Procedure Laterality Date   ABDOMINAL HYSTERECTOMY      Social History   Socioeconomic History   Marital status: Widowed    Spouse name: Not on file   Number of children: 1   Years of education: 12   Highest education level: High school graduate  Occupational History   Not on file  Tobacco Use   Smoking status: Never   Smokeless tobacco: Never  Vaping Use   Vaping status: Never Used  Substance and Sexual Activity   Alcohol use: Not Currently   Drug use: Never   Sexual activity: Not Currently    Birth control/protection: Surgical  Other Topics Concern   Not on file  Social History Narrative   Right handed   Drinks caffeine coffee   Lives alone   Retired   One son   Social Drivers of Corporate investment banker Strain: Low Risk  (01/06/2023)   Overall Financial Resource Strain (CARDIA)    Difficulty of Paying Living Expenses: Not hard at all  Food Insecurity: No Food Insecurity (01/06/2023)   Hunger Vital Sign    Worried About Running Out of Food in the Last Year: Never true    Ran Out of Food in the Last Year: Never true  Transportation Needs: No Transportation Needs (01/06/2023)   PRAPARE - Administrator, Civil Service (Medical): No    Lack of Transportation (Non-Medical): No  Physical Activity: Insufficiently Active (01/06/2023)   Exercise Vital Sign    Days of Exercise per Week: 3 days    Minutes of Exercise per Session: 30 min  Stress: No Stress Concern Present (01/06/2023)   [employer] of Occupational Health - Occupational Stress Questionnaire    Feeling of Stress : Not at all  Social Connections: Moderately Integrated (01/06/2023)   Social Connection and Isolation Panel [NHANES]    Frequency of Communication with Friends and Family: More than three times a week    Frequency of Social Gatherings with Friends and  Family: More than three times a week    Attends Religious Services: More than 4 times per year    Active Member of [employer] or Organizations: Yes    Attends Banker Meetings: More than 4 times per year    Marital Status: Widowed  Intimate Partner Violence: Not At Risk (01/06/2023)   Humiliation, Afraid, Rape, and Kick questionnaire    Fear of Current or Ex-Partner: No    Emotionally Abused: No  Physically Abused: No    Sexually Abused: No    Outpatient Encounter Medications as of 10/07/2023  Medication Sig   aspirin 81 MG chewable tablet Chew by mouth daily.   diclofenac (VOLTAREN) 75 MG EC tablet Take 1 tablet (75 mg total) by mouth 2 (two) times daily.   DULoxetine (CYMBALTA) 30 MG capsule Take 1 capsule (30 mg total) by mouth daily.   levothyroxine (SYNTHROID) 125 MCG tablet TAKE 1/2 TABLET BY MOUTH EVERY MORNING BEFORE BREAKFAST   pantoprazole (PROTONIX) 40 MG tablet Take 1 tablet (40 mg total) by mouth daily.   [DISCONTINUED] hydrOXYzine (ATARAX) 10 MG tablet Take 1 tablet (10 mg total) by mouth 3 (three) times daily as needed for anxiety.   [DISCONTINUED] lisinopril-hydrochlorothiazide (ZESTORETIC) 20-12.5 MG tablet Take 1 tablet by mouth daily.   [DISCONTINUED] metFORMIN (GLUCOPHAGE) 500 MG tablet Take 1 tablet (500 mg total) by mouth 2 (two) times daily with a meal. TAKE 1 TABLET BY MOUTH TWICE A DAY WITH A MEAL   [DISCONTINUED] sucralfate (CARAFATE) 1 g tablet Take 1 tablet (1 g total) by mouth 2 (two) times daily.   hydrOXYzine (ATARAX) 10 MG tablet Take 1 tablet (10 mg total) by mouth 3 (three) times daily as needed for anxiety.   lisinopril-hydrochlorothiazide (ZESTORETIC) 20-12.5 MG tablet Take 1 tablet by mouth daily.   metFORMIN (GLUCOPHAGE) 500 MG tablet Take 1 tablet (500 mg total) by mouth 2 (two) times daily with a meal. TAKE 1 TABLET BY MOUTH TWICE A DAY WITH A MEAL   sucralfate (CARAFATE) 1 g tablet Take 1 tablet (1 g total) by mouth 2 (two) times daily.    No facility-administered encounter medications on file as of 10/07/2023.    No Known Allergies  Pertinent ROS per HPI, otherwise unremarkable      Objective:  BP 127/62   Pulse 92   Temp (!) 97.5 F (36.4 C)   Ht 5\' 1"  (1.549 m)   Wt 145 lb (65.8 kg)   SpO2 99%   BMI 27.40 kg/m    Wt Readings from Last 3 Encounters:  10/07/23 145 lb (65.8 kg)  08/06/23 144 lb 3.2 oz (65.4 kg)  07/29/23 145 lb (65.8 kg)    Physical Exam Vitals and nursing note reviewed.  Constitutional:      General: She is not in acute distress.    Appearance: Normal appearance. She is well-developed and well-groomed. She is not ill-appearing, toxic-appearing or diaphoretic.  HENT:     Head: Normocephalic and atraumatic.     Jaw: There is normal jaw occlusion.     Right Ear: Hearing normal.     Left Ear: Hearing normal.     Nose: Nose normal.     Mouth/Throat:     Lips: Pink.     Mouth: Mucous membranes are moist.     Pharynx: Oropharynx is clear. Uvula midline.  Eyes:     General: Lids are normal.     Extraocular Movements: Extraocular movements intact.     Conjunctiva/sclera: Conjunctivae normal.     Pupils: Pupils are equal, round, and reactive to light.  Neck:     Thyroid: No thyroid mass, thyromegaly or thyroid tenderness.     Vascular: No carotid bruit or JVD.     Trachea: Trachea and phonation normal.  Cardiovascular:     Rate and Rhythm: Normal rate and regular rhythm.     Chest Wall: PMI is not displaced.     Pulses: Normal pulses.  Dorsalis pedis pulses are 2+ on the right side and 2+ on the left side.       Posterior tibial pulses are 2+ on the right side and 2+ on the left side.     Heart sounds: Normal heart sounds. No murmur heard.    No friction rub. No gallop.  Pulmonary:     Effort: Pulmonary effort is normal. No respiratory distress.     Breath sounds: Normal breath sounds. No wheezing.  Abdominal:     General: Bowel sounds are normal. There is no distension  or abdominal bruit.     Palpations: Abdomen is soft. There is no hepatomegaly or splenomegaly.     Tenderness: There is no abdominal tenderness. There is no right CVA tenderness or left CVA tenderness.     Hernia: No hernia is present.  Musculoskeletal:        General: Normal range of motion.     Cervical back: Normal range of motion and neck supple.     Right lower leg: No edema.     Left lower leg: No edema.  Feet:     Right foot:     Protective Sensation: 10 sites tested.  10 sites sensed.     Skin integrity: Skin integrity normal.     Toenail Condition: Right toenails are normal.     Left foot:     Protective Sensation: 10 sites tested.  10 sites sensed.     Skin integrity: Skin integrity normal.     Toenail Condition: Left toenails are normal.  Lymphadenopathy:     Cervical: No cervical adenopathy.  Skin:    General: Skin is warm and dry.     Capillary Refill: Capillary refill takes less than 2 seconds.     Coloration: Skin is not cyanotic, jaundiced or pale.     Findings: No rash.  Neurological:     General: No focal deficit present.     Mental Status: She is alert and oriented to person, place, and time.     Sensory: Sensation is intact.     Motor: Motor function is intact.     Coordination: Coordination is intact.     Gait: Gait is intact.     Deep Tendon Reflexes: Reflexes are normal and symmetric.  Psychiatric:        Attention and Perception: Attention and perception normal.        Mood and Affect: Mood and affect normal.        Speech: Speech normal.        Behavior: Behavior normal. Behavior is cooperative.        Thought Content: Thought content normal.        Cognition and Memory: Cognition and memory normal.        Judgment: Judgment normal.      Results for orders placed or performed during the hospital encounter of 07/30/23  TSH   Collection Time: 07/30/23  2:00 PM  Result Value Ref Range   TSH 0.620 0.350 - 4.500 uIU/mL  T4, free   Collection  Time: 07/30/23  2:01 PM  Result Value Ref Range   Free T4 1.00 0.61 - 1.12 ng/dL       Pertinent labs & imaging results that were available during my care of the patient were reviewed by me and considered in my medical decision making.  Assessment & Plan:  Keyshawn was seen today for medical management of chronic issues.  Diagnoses and all orders for this visit:  Diabetes mellitus type 2 with complications (HCC) -     Lipid panel -     CBC with Differential/Platelet -     Thyroid Panel With TSH -     Bayer DCA Hb A1c Waived -     CMP14+EGFR -     Microalbumin / creatinine urine ratio -     metFORMIN (GLUCOPHAGE) 500 MG tablet; Take 1 tablet (500 mg total) by mouth 2 (two) times daily with a meal. TAKE 1 TABLET BY MOUTH TWICE A DAY WITH A MEAL  Hypertension associated with diabetes (HCC) -     Lipid panel -     CBC with Differential/Platelet -     Thyroid Panel With TSH -     Bayer DCA Hb A1c Waived -     CMP14+EGFR -     Microalbumin / creatinine urine ratio -     lisinopril-hydrochlorothiazide (ZESTORETIC) 20-12.5 MG tablet; Take 1 tablet by mouth daily.  Acquired hypothyroidism -     Thyroid Panel With TSH  Situational anxiety -     Thyroid Panel With TSH -     hydrOXYzine (ATARAX) 10 MG tablet; Take 1 tablet (10 mg total) by mouth 3 (three) times daily as needed for anxiety.  Gastroesophageal reflux disease without esophagitis -     CBC with Differential/Platelet -     sucralfate (CARAFATE) 1 g tablet; Take 1 tablet (1 g total) by mouth 2 (two) times daily.  Memory loss Followed by neurology  Paresthesias -     CMP14+EGFR  Lumbar radiculopathy Currently in PT for symptom management.     Assessment and Plan    Lumbar Radiculopathy Chronic lumbar radiculopathy with ongoing back pain and lower extremity symptoms. Minimal improvement with physical therapy. Compression socks provide some relief. Patient prefers to continue therapy before considering injection  therapy. - Continue physical therapy for two more sessions - Consider additional three sessions if insurance approves - Evaluate effectiveness of therapy after additional sessions - Discuss potential for injection therapy if no improvement  Peripheral Neuropathy Peripheral neuropathy with numbness and tingling in feet. Symptoms partially managed with increased dose of medication (30 mg). Some improvement in tingling and pain but still experiencing cold feet. - Continue current medication regimen - Reassess symptoms at next appointment in January  Chronic Pain Chronic pain managed with diclofenac. Patient willing to discontinue if necessary due to potential kidney function concerns. Will monitor renal function closely. - Continue diclofenac - Monitor kidney function closely - Reassess need for diclofenac based on kidney function results  Hypertension Well-controlled hypertension on current medication regimen. - Continue current antihypertensive medication  Type 2 Diabetes Mellitus Well-controlled diabetes with recent A1c of 5.5%. - Continue current metformin regimen - Monitor blood glucose levels regularly  Hypothyroidism Hypothyroidism managed with levothyroxine. Current dose is 125 mg with half tablet taken daily due to previous high dosage concerns. - Continue current levothyroxine regimen - Follow up with thyroid specialist in February  Gastroesophageal Reflux Disease (GERD) GERD managed with current medication regimen. Patient desires to reduce medication burden but acknowledges need for current treatment. - Continue current reflux medication (PPI and sucralfate)  Iron Deficiency Anemia Iron deficiency anemia managed with weekly iron supplementation. No constipation reported, using fiber powder as needed. - Continue weekly iron supplementation - Monitor iron levels and adjust dosage if necessary  General Health Maintenance Maintaining an active lifestyle with regular  walking. - Continue regular walking regimen  Follow-up - Follow up with virology office in  January - Follow up with thyroid specialist in February - Recheck renal function today - Check protein in urine today - Follow up in 3-4 months unless issues arise.          Continue all other maintenance medications.  Follow up plan: Return in about 3 months (around 01/05/2024), or if symptoms worsen or fail to improve, for DM.   Continue healthy lifestyle choices, including diet (rich in fruits, vegetables, and lean proteins, and low in salt and simple carbohydrates) and exercise (at least 30 minutes of moderate physical activity daily).  Educational handout given for DM  The above assessment and management plan was discussed with the patient. The patient verbalized understanding of and has agreed to the management plan. Patient is aware to call the clinic if they develop any new symptoms or if symptoms persist or worsen. Patient is aware when to return to the clinic for a follow-up visit. Patient educated on when it is appropriate to go to the emergency department.   Kari Baars, FNP-C [facility] [phone]

## 2023-10-07 NOTE — Patient Instructions (Addendum)
Continue to monitor your blood sugars as we discussed and record them. Bring the log to your next appointment.  Take your medications as directed.   ? ?Goal Blood glucose:  ?  Fasting (before meals) = 80 to 130 ?  Within 2 hours of eating = less than 180 ? ? ?Understanding your Hemoglobin A1c: 5.5 ? ? ? ? ?Diabetes Mellitus and Nutrition ? ? ? ?I think that you would greatly benefit from seeing a nutritionist. If this is something you are interested in, please call Dr Jenne Campus at [phone] to schedule an appointment. ? ? ?When you have diabetes (diabetes mellitus), it is very important to have healthy eating habits because your blood sugar (glucose) levels are greatly affected by what you eat and drink. Eating healthy foods in the appropriate amounts, at about the same times every day, can help you: ?Control your blood glucose. ?Lower your risk of heart disease. ?Improve your blood pressure. ?Reach or maintain a healthy weight. ? ?Every person with diabetes is different, and each person has different needs for a meal plan. Your health care provider may recommend that you work with a diet and nutrition specialist (dietitian) to make a meal plan that is best for you. Your meal plan may vary depending on factors such as: ?The calories you need. ?The medicines you take. ?Your weight. ?Your blood glucose, blood pressure, and cholesterol levels. ?Your activity level. ?Other health conditions you have, such as heart or kidney disease. ? ?How do carbohydrates affect me? ?Carbohydrates affect your blood glucose level more than any other type of food. Eating carbohydrates naturally increases the amount of glucose in your blood. Carbohydrate counting is a method for keeping track of how many carbohydrates you eat. Counting carbohydrates is important to keep your blood glucose at a healthy level, especially if you use insulin or take certain oral diabetes medicines. ?It is important to know how many carbohydrates you can safely  have in each meal. This is different for every person. Your dietitian can help you calculate how many carbohydrates you should have at each meal and for snack. ?Foods that contain carbohydrates include: ?Bread, cereal, rice, pasta, and crackers. ?Potatoes and corn. ?Peas, beans, and lentils. ?Milk and yogurt. ?Fruit and juice. ?Desserts, such as cakes, cookies, ice cream, and candy. ? ?How does alcohol affect me? ?Alcohol can cause a sudden decrease in blood glucose (hypoglycemia), especially if you use insulin or take certain oral diabetes medicines. Hypoglycemia can be a life-threatening condition. Symptoms of hypoglycemia (sleepiness, dizziness, and confusion) are similar to symptoms of having too much alcohol. ?If your health care provider says that alcohol is safe for you, follow these guidelines: ?Limit alcohol intake to no more than 1 drink per day for nonpregnant women and 2 drinks per day for men. One drink equals 12 oz of beer, 5 oz of wine, or 1? oz of hard liquor. ?Do not drink on an empty stomach. ?Keep yourself hydrated with water, diet soda, or unsweetened iced tea. ?Keep in mind that regular soda, juice, and other mixers may contain a lot of sugar and must be counted as carbohydrates. ? ?What are tips for following this plan? ? ?Reading food labels ?Start by checking the serving size on the label. The amount of calories, carbohydrates, fats, and other nutrients listed on the label are based on one serving of the food. Many foods contain more than one serving per package. ?Check the total grams (g) of carbohydrates in one serving. You can calculate  number of servings of carbohydrates in one serving by dividing the total carbohydrates by 15. For example, if a food has 30 g of total carbohydrates, it would be equal to 2 servings of carbohydrates. Check the number of grams (g) of saturated and trans fats in one serving. Choose foods that have low or no amount of these fats. Check the number of  milligrams (mg) of sodium in one serving. Most people should limit total sodium intake to less than 2,300 mg per day. Always check the nutrition information of foods labeled as "low-fat" or "nonfat". These foods may be higher in added sugar or refined carbohydrates and should be avoided. Talk to your dietitian to identify your daily goals for nutrients listed on the label.  Shopping Avoid buying canned, premade, or processed foods. These foods tend to be high in fat, sodium, and added sugar. Shop around the outside edge of the grocery store. This includes fresh fruits and vegetables, bulk grains, fresh meats, and fresh dairy.  Cooking Use low-heat cooking methods, such as baking, instead of high-heat cooking methods like deep frying. Cook using healthy oils, such as olive, canola, or sunflower oil. Avoid cooking with butter, cream, or high-fat meats.  Meal planning Eat meals and snacks regularly, preferably at the same times every day. Avoid going long periods of time without eating. Eat foods high in fiber, such as fresh fruits, vegetables, beans, and whole grains. Talk to your dietitian about how many servings of carbohydrates you can eat at each meal. Eat 4-6 ounces of lean protein each day, such as lean meat, chicken, fish, eggs, or tofu. 1 ounce is equal to 1 ounce of meat, chicken, or fish, 1 egg, or 1/4 cup of tofu. Eat some foods each day that contain healthy fats, such as avocado, nuts, seeds, and fish.  Lifestyle  Check your blood glucose regularly. Exercise at least 30 minutes 5 or more days each week, or as told by your health care provider. Take medicines as told by your health care provider. Do not use any products that contain nicotine or tobacco, such as cigarettes and e-cigarettes. If you need help quitting, ask your health care provider. Work with a counselor or diabetes educator to identify strategies to manage stress and any emotional and social challenges.  What are  some questions to ask my health care provider? Do I need to meet with a diabetes educator? Do I need to meet with a dietitian? What number can I call if I have questions? When are the best times to check my blood glucose?  Where to find more information: American Diabetes Association: diabetes.org/food-and-fitness/food Academy of Nutrition and Dietetics: www.eatright.org/resources/health/diseases-and-conditions/diabetes National Institute of Diabetes and Digestive and Kidney Diseases (NIH): www.niddk.nih.gov/health-information/diabetes/overview/diet-eating-physical-activity  Summary A healthy meal plan will help you control your blood glucose and maintain a healthy lifestyle. Working with a diet and nutrition specialist (dietitian) can help you make a meal plan that is best for you. Keep in mind that carbohydrates and alcohol have immediate effects on your blood glucose levels. It is important to count carbohydrates and to use alcohol carefully. This information is not intended to replace advice given to you by your health care provider. Make sure you discuss any questions you have with your health care provider. Document Released: 07/02/2005 Document Revised: 11/09/2016 Document Reviewed: 11/09/2016 Elsevier Interactive Patient Education  2018 Else[employer] Inc.    

## 2023-10-08 LAB — THYROID PANEL WITH TSH
Free Thyroxine Index: 2.2 (ref 1.2–4.9)
T3 Uptake Ratio: 26 % (ref 24–39)
T4, Total: 8.5 ug/dL (ref 4.5–12.0)
TSH: 0.431 u[IU]/mL — ABNORMAL LOW (ref 0.450–4.500)

## 2023-10-08 LAB — LIPID PANEL
Chol/HDL Ratio: 3 {ratio} (ref 0.0–4.4)
Cholesterol, Total: 155 mg/dL (ref 100–199)
HDL: 52 mg/dL (ref >39)
LDL Chol Calc (NIH): 75 mg/dL (ref 0–99)
Triglycerides: 168 mg/dL — ABNORMAL HIGH (ref 0–149)
VLDL Cholesterol Cal: 28 mg/dL (ref 5–40)

## 2023-10-08 LAB — CMP14+EGFR
ALT: 12 [IU]/L (ref 0–32)
AST: 16 [IU]/L (ref 0–40)
Albumin: 4.1 g/dL (ref 3.8–4.8)
Alkaline Phosphatase: 76 [IU]/L (ref 44–121)
BUN/Creatinine Ratio: 10 — ABNORMAL LOW (ref 12–28)
BUN: 12 mg/dL (ref 8–27)
Bilirubin Total: <0.2 mg/dL (ref 0.0–1.2)
CO2: 22 mmol/L (ref 20–29)
Calcium: 9.6 mg/dL (ref 8.7–10.3)
Chloride: 103 mmol/L (ref 96–106)
Creatinine, Ser: 1.17 mg/dL — ABNORMAL HIGH (ref 0.57–1.00)
Globulin, Total: 2.1 g/dL (ref 1.5–4.5)
Glucose: 178 mg/dL — ABNORMAL HIGH (ref 70–99)
Potassium: 4.5 mmol/L (ref 3.5–5.2)
Sodium: 141 mmol/L (ref 134–144)
Total Protein: 6.2 g/dL (ref 6.0–8.5)
eGFR: 49 mL/min/{1.73_m2} — ABNORMAL LOW (ref >59)

## 2023-10-08 LAB — CBC WITH DIFFERENTIAL/PLATELET
Basophils Absolute: 0 10*3/uL (ref 0.0–0.2)
Basos: 0 %
EOS (ABSOLUTE): 0.1 10*3/uL (ref 0.0–0.4)
Eos: 2 %
Hematocrit: 31.8 % — ABNORMAL LOW (ref 34.0–46.6)
Hemoglobin: 9.7 g/dL — ABNORMAL LOW (ref 11.1–15.9)
Immature Grans (Abs): 0 10*3/uL (ref 0.0–0.1)
Immature Granulocytes: 0 %
Lymphocytes Absolute: 1.4 10*3/uL (ref 0.7–3.1)
Lymphs: 26 %
MCH: 24.1 pg — ABNORMAL LOW (ref 26.6–33.0)
MCHC: 30.5 g/dL — ABNORMAL LOW (ref 31.5–35.7)
MCV: 79 fL (ref 79–97)
Monocytes Absolute: 0.3 10*3/uL (ref 0.1–0.9)
Monocytes: 6 %
Neutrophils Absolute: 3.6 10*3/uL (ref 1.4–7.0)
Neutrophils: 66 %
Platelets: 507 10*3/uL — ABNORMAL HIGH (ref 150–450)
RBC: 4.02 x10E6/uL (ref 3.77–5.28)
RDW: 16.5 % — ABNORMAL HIGH (ref 11.7–15.4)
WBC: 5.4 10*3/uL (ref 3.4–10.8)

## 2023-10-08 LAB — MICROALBUMIN / CREATININE URINE RATIO
Creatinine, Urine: 75.5 mg/dL
Microalb/Creat Ratio: 5 mg/g{creat} (ref 0–29)
Microalbumin, Urine: 3.4 ug/mL

## 2023-10-11 ENCOUNTER — Ambulatory Visit: Payer: Medicare Other

## 2023-10-11 DIAGNOSIS — M6281 Muscle weakness (generalized): Secondary | ICD-10-CM

## 2023-10-11 DIAGNOSIS — M5459 Other low back pain: Secondary | ICD-10-CM | POA: Diagnosis not present

## 2023-10-11 NOTE — Therapy (Signed)
OUTPATIENT PHYSICAL THERAPY THORACOLUMBAR TREATMENT   Patient Name: Andrea Shannon MRN: [id number] DOB:05-31-1946, 77 y.o., female Today's Date: 10/11/2023  END OF SESSION:  PT End of Session - 10/11/23 1304     Visit Number 4    Number of Visits 6    Date for PT Re-Evaluation 11/19/23    PT Start Time 1300    PT Stop Time 1348    PT Time Calculation (min) 48 min    Activity Tolerance Patient tolerated treatment well    Behavior During Therapy [facility] for tasks assessed/performed             Past Medical History:  Diagnosis Date   Acid reflux    Arthritis    Diabetes mellitus, type II (HCC)    Hypertension    Hyperthyroidism    Hypothyroidism    Past Surgical History:  Procedure Laterality Date   ABDOMINAL HYSTERECTOMY     Patient Active Problem List   Diagnosis Date Noted   Memory loss 10/07/2023   Paresthesias 10/07/2023   Lumbar radiculopathy 10/07/2023   Chronic pain of multiple joints 12/02/2022   Acquired hypothyroidism 04/24/2022   Gastroesophageal reflux disease without esophagitis 04/22/2022   Anxiety 04/22/2022   Diabetes mellitus type 2 with complications (HCC) 10/21/2021   Hypertension associated with diabetes (HCC) 10/21/2021   OAB (overactive bladder) 04/15/2020   Toxic multinodul goiter 09/26/2018    PCP: Sonny Masters, FNP  REFERRING PROVIDER: Bedelia Person, MD   REFERRING DIAG: Radiculopathy, lumbar region   Rationale for Evaluation and Treatment: Rehabilitation  THERAPY DIAG:  Other low back pain  Muscle weakness (generalized)  ONSET DATE: January 2024  SUBJECTIVE:                                                                                                                                                                                           SUBJECTIVE STATEMENT: Pt denies any pain today  PERTINENT HISTORY:  Hypertension, diabetes type 2, anxiety, and arthritis  PAIN:  Are you having pain? No  PRECAUTIONS:  None  RED FLAGS: None   WEIGHT BEARING RESTRICTIONS: No  FALLS:  Has patient fallen in last 6 months? No  LIVING ENVIRONMENT: Lives with: lives alone Lives in: House/apartment Stairs: Yes: Internal: 12 steps; can reach both; reciprocal pattern, always uses both rails Has following equipment at home: None  OCCUPATION: retired  PLOF: Independent  PATIENT GOALS: reduced pain   NEXT MD VISIT: 11/15/23  OBJECTIVE:  Note: Objective measures were completed at Evaluation unless otherwise noted.  DIAGNOSTIC FINDINGS: 07/19/23 lumbar MRI IMPRESSION: 1. Progressive advanced facet hypertrophy and grade 1 anterolisthesis  at L4-5 with progressive uncovering of a broad-based disc protrusion. This leads to progressive moderate central canal stenosis with left greater than right subarticular stenosis. 2. Mild to moderate foraminal narrowing bilaterally at L4-5. 3. Shallow central disc protrusion at L5-S1 potentially contacts the traversing S1 nerve roots bilaterally. 4. New leftward disc protrusion at L3-4 extends into the left neural foramen without significant focal stenosis.  SCREENING FOR RED FLAGS: Bowel or bladder incontinence: No Spinal tumors: No Cauda equina syndrome: No Compression fracture: No Abdominal aneurysm: No  COGNITION: Overall cognitive status: Within functional limits for tasks assessed     SENSATION: Light touch: Impaired  and reduced sensation in left S1 dermatome Patient reports that she has numbness in both legs.   POSTURE: forward head  PALPATION: TTP: L4 spinous process   LUMBAR JOINT MOBILITY:  L1-4: WFL and nonpainful   L4-5: WFL and painful   LUMBAR ROM:   AROM eval  Flexion WFL (able to touch her toes)  Extension 10  Right lateral flexion 28; familiar pain  Left lateral flexion 22  Right rotation 25% limitation  Left rotation 50% limitation   (Blank rows = not tested)  LOWER EXTREMITY MMT:    MMT Right eval Left eval  Hip flexion  4-/5 4-/5  Hip extension    Hip abduction    Hip adduction    Hip internal rotation    Hip external rotation    Knee flexion 4/5 4/5  Knee extension 4-/5 4/5  Ankle dorsiflexion 4/5 4/5  Ankle plantarflexion    Ankle inversion    Ankle eversion     (Blank rows = not tested)  FUNCTIONAL TESTS:  5 times sit to stand: 19.08 seconds without UE support Timed up and go (TUG): 9.72 seconds  GAIT: Assistive device utilized: None Level of assistance: Complete Independence Comments: no significant gait deviations observed  TODAY'S TREATMENT:                                                                                                                              DATE:                                       EXERCISE LOG  Exercise Repetitions and Resistance Comments  Recumbent Bike Lvl 3; seat 3 x 17 mins   Rockerboard 5 mins   Side-Stepping 5 mins   Tandem Gait 4 mins   NBOS    Tandem    LAQs 3# x 20 reps bil   Seated Marches 3# x 20 reps bil   Seated Ham Curls Green x 20 reps bil    Blank cell = exercise not performed today   PATIENT EDUCATION:  Education details: plan of care, prognosis, objective findings, and goals for physical therapy Person educated: Patient Education method: Explanation Education comprehension: verbalized understanding  HOME EXERCISE PROGRAM:  ASSESSMENT:  CLINICAL IMPRESSION: Pt arrives for today's treatment session denying any pain.  Pt reports that she feels that therapy is helping her at this time, as she is having more frequent days without experiencing pain.  Pt able to tolerate increased time with standing exercises today with minimal UE support required for safety and stability.  Pt also able to tolerate increased weight and resistance with seated exercises today without complaint of pain or discomfort.  Pt denied any pain at completion of today's treatment session.   OBJECTIVE IMPAIRMENTS: decreased strength and pain.   ACTIVITY  LIMITATIONS: locomotion level  PARTICIPATION LIMITATIONS: community activity  PERSONAL FACTORS: 3+ comorbidities: Hypertension, diabetes type 2, anxiety, and arthritis  are also affecting patient's functional outcome.   REHAB POTENTIAL: Good  CLINICAL DECISION MAKING: Stable/uncomplicated  EVALUATION COMPLEXITY: Low   GOALS: Goals reviewed with patient? Yes  LONG TERM GOALS: Target date: 10/20/23  Patient will be independent with her HEP.  Baseline:  Goal status: IN PROGRESS  2.  Patient will improve her five time sit to stand time to 12 seconds or less for improved lower extremity power.  Baseline:  Goal status: IN PROGRESS  3.  Patient will improve her bilateral hip flexor strength to at least 4/5 for improved lower extremity strength.  Baseline:  Goal status: IN PROGRESS  PLAN:  PT FREQUENCY: 1-2x/week  PT DURATION: 4 weeks  PLANNED INTERVENTIONS: 97110-Therapeutic exercises, 97530- Therapeutic activity, [id number]- Neuromuscular re-education, 97535- Self Care, 16109- Manual therapy, 97014- Electrical stimulation (unattended), Patient/Family education, Balance training, Spinal mobilization, Cryotherapy, and Moist heat.  PLAN FOR NEXT SESSION: Recumbent bike, lumbar stabilization, lower trunk rotations, manual therapy, and modalities as needed   Newman Pies, PTA 10/11/2023, 1:57 PM

## 2023-10-18 ENCOUNTER — Ambulatory Visit: Payer: Medicare Other

## 2023-10-18 DIAGNOSIS — M6281 Muscle weakness (generalized): Secondary | ICD-10-CM

## 2023-10-18 DIAGNOSIS — M5459 Other low back pain: Secondary | ICD-10-CM

## 2023-10-18 NOTE — Therapy (Signed)
OUTPATIENT PHYSICAL THERAPY THORACOLUMBAR TREATMENT   Patient Name: Andrea Shannon MRN: [id number] DOB:1946/02/25, 77 y.o., female Today's Date: 10/18/2023  END OF SESSION:  PT End of Session - 10/18/23 1305     Visit Number 5    Number of Visits 6    Date for PT Re-Evaluation 11/19/23    PT Start Time 1301    PT Stop Time 1346    PT Time Calculation (min) 45 min    Activity Tolerance Patient tolerated treatment well    Behavior During Therapy [facility] for tasks assessed/performed              Past Medical History:  Diagnosis Date   Acid reflux    Arthritis    Diabetes mellitus, type II (HCC)    Hypertension    Hyperthyroidism    Hypothyroidism    Past Surgical History:  Procedure Laterality Date   ABDOMINAL HYSTERECTOMY     Patient Active Problem List   Diagnosis Date Noted   Memory loss 10/07/2023   Paresthesias 10/07/2023   Lumbar radiculopathy 10/07/2023   Chronic pain of multiple joints 12/02/2022   Acquired hypothyroidism 04/24/2022   Gastroesophageal reflux disease without esophagitis 04/22/2022   Anxiety 04/22/2022   Diabetes mellitus type 2 with complications (HCC) 10/21/2021   Hypertension associated with diabetes (HCC) 10/21/2021   OAB (overactive bladder) 04/15/2020   Toxic multinodul goiter 09/26/2018    PCP: Sonny Masters, FNP  REFERRING PROVIDER: Bedelia Person, MD   REFERRING DIAG: Radiculopathy, lumbar region   Rationale for Evaluation and Treatment: Rehabilitation  THERAPY DIAG:  Other low back pain  Muscle weakness (generalized)  ONSET DATE: January 2024  SUBJECTIVE:                                                                                                                                                                                           SUBJECTIVE STATEMENT: Patient reports that she feels good today, but her back was hurting "a little bit" this morning. However, it is not hurting now.   PERTINENT HISTORY:   Hypertension, diabetes type 2, anxiety, and arthritis  PAIN:  Are you having pain? No  PRECAUTIONS: None  RED FLAGS: None   WEIGHT BEARING RESTRICTIONS: No  FALLS:  Has patient fallen in last 6 months? No  LIVING ENVIRONMENT: Lives with: lives alone Lives in: House/apartment Stairs: Yes: Internal: 12 steps; can reach both; reciprocal pattern, always uses both rails Has following equipment at home: None  OCCUPATION: retired  PLOF: Independent  PATIENT GOALS: reduced pain   NEXT MD VISIT: 11/15/23  OBJECTIVE:  Note: Objective measures were completed at  Evaluation unless otherwise noted.  DIAGNOSTIC FINDINGS: 07/19/23 lumbar MRI IMPRESSION: 1. Progressive advanced facet hypertrophy and grade 1 anterolisthesis at L4-5 with progressive uncovering of a broad-based disc protrusion. This leads to progressive moderate central canal stenosis with left greater than right subarticular stenosis. 2. Mild to moderate foraminal narrowing bilaterally at L4-5. 3. Shallow central disc protrusion at L5-S1 potentially contacts the traversing S1 nerve roots bilaterally. 4. New leftward disc protrusion at L3-4 extends into the left neural foramen without significant focal stenosis.  SCREENING FOR RED FLAGS: Bowel or bladder incontinence: No Spinal tumors: No Cauda equina syndrome: No Compression fracture: No Abdominal aneurysm: No  COGNITION: Overall cognitive status: Within functional limits for tasks assessed     SENSATION: Light touch: Impaired  and reduced sensation in left S1 dermatome Patient reports that she has numbness in both legs.   POSTURE: forward head  PALPATION: TTP: L4 spinous process   LUMBAR JOINT MOBILITY:  L1-4: WFL and nonpainful   L4-5: WFL and painful   LUMBAR ROM:   AROM eval  Flexion WFL (able to touch her toes)  Extension 10  Right lateral flexion 28; familiar pain  Left lateral flexion 22  Right rotation 25% limitation  Left rotation 50%  limitation   (Blank rows = not tested)  LOWER EXTREMITY MMT:    MMT Right eval Left eval  Hip flexion 4-/5 4-/5  Hip extension    Hip abduction    Hip adduction    Hip internal rotation    Hip external rotation    Knee flexion 4/5 4/5  Knee extension 4-/5 4/5  Ankle dorsiflexion 4/5 4/5  Ankle plantarflexion    Ankle inversion    Ankle eversion     (Blank rows = not tested)  FUNCTIONAL TESTS:  5 times sit to stand: 19.08 seconds without UE support Timed up and go (TUG): 9.72 seconds  GAIT: Assistive device utilized: None Level of assistance: Complete Independence Comments: no significant gait deviations observed  TODAY'S TREATMENT:                                                                                                                              DATE:                                     10/18/23 EXERCISE LOG  Exercise Repetitions and Resistance Comments  Recumbent bike  L3 x 17 minutes   Rocker board  5 minutes  BUE support  Eccentric heel tap  6" step x 20 reps each    Standing hip ABD 1.5 minutes each    Standing HS curl  20 reps each  Intermittent UE support   NBOS on foam  2 minutes  Eyes closed; intermittent UE support   Blank cell = exercise not performed today  EXERCISE LOG  Exercise Repetitions and Resistance Comments  Recumbent Bike Lvl 3; seat 3 x 17 mins   Rockerboard 5 mins   Side-Stepping 5 mins   Tandem Gait 4 mins   NBOS    Tandem    LAQs 3# x 20 reps bil   Seated Marches 3# x 20 reps bil   Seated Ham Curls Green x 20 reps bil    Blank cell = exercise not performed today   PATIENT EDUCATION:  Education details: plan of care, prognosis, objective findings, and goals for physical therapy Person educated: Patient Education method: Explanation Education comprehension: verbalized understanding  HOME EXERCISE PROGRAM:   ASSESSMENT:  CLINICAL IMPRESSION: Patient was progressed with multiple new  and familiar interventions for lower extremity strength and stability with moderate difficulty. She required minimal cueing with eccentric heel taps for improved eccentric quadriceps for a controlled descent needed for safely navigating stairs. She experienced a mild increase in low back fatigue and discomfort with standing hip abduction, but this did not persist into any of today's other interventions. She reported feeling tired upon the conclusion of treatment. She will be discharged with an updated HEP at her next appointment, as able.   OBJECTIVE IMPAIRMENTS: decreased strength and pain.   ACTIVITY LIMITATIONS: locomotion level  PARTICIPATION LIMITATIONS: community activity  PERSONAL FACTORS: 3+ comorbidities: Hypertension, diabetes type 2, anxiety, and arthritis  are also affecting patient's functional outcome.   REHAB POTENTIAL: Good  CLINICAL DECISION MAKING: Stable/uncomplicated  EVALUATION COMPLEXITY: Low   GOALS: Goals reviewed with patient? Yes  LONG TERM GOALS: Target date: 10/20/23  Patient will be independent with her HEP.  Baseline:  Goal status: IN PROGRESS  2.  Patient will improve her five time sit to stand time to 12 seconds or less for improved lower extremity power.  Baseline:  Goal status: IN PROGRESS  3.  Patient will improve her bilateral hip flexor strength to at least 4/5 for improved lower extremity strength.  Baseline:  Goal status: IN PROGRESS  PLAN:  PT FREQUENCY: 1-2x/week  PT DURATION: 4 weeks  PLANNED INTERVENTIONS: 97110-Therapeutic exercises, 97530- Therapeutic activity, [id number]- Neuromuscular re-education, 97535- Self Care, [id number]- Manual therapy, 97014- Electrical stimulation (unattended), Patient/Family education, Balance training, Spinal mobilization, Cryotherapy, and Moist heat.  PLAN FOR NEXT SESSION: Recumbent bike, lumbar stabilization, lower trunk rotations, manual therapy, and modalities as needed   Granville Lewis, PT 10/18/2023,  4:17 PM

## 2023-10-29 ENCOUNTER — Ambulatory Visit: Payer: Medicare Other | Attending: Neurosurgery

## 2023-10-29 DIAGNOSIS — M5459 Other low back pain: Secondary | ICD-10-CM | POA: Insufficient documentation

## 2023-10-29 DIAGNOSIS — M6281 Muscle weakness (generalized): Secondary | ICD-10-CM | POA: Diagnosis not present

## 2023-10-29 NOTE — Therapy (Signed)
 OUTPATIENT PHYSICAL THERAPY THORACOLUMBAR TREATMENT   Patient Name: Andrea Shannon MRN: [id number] DOB:24-Aug-1946, 78 y.o., female Today's Date: 10/29/2023  END OF SESSION:  PT End of Session - 10/29/23 1025     Visit Number 6    Number of Visits 6    Date for PT Re-Evaluation 11/19/23    PT Start Time 1015    PT Stop Time 1100    PT Time Calculation (min) 45 min    Activity Tolerance Patient tolerated treatment well    Behavior During Therapy [facility] for tasks assessed/performed               Past Medical History:  Diagnosis Date   Acid reflux    Arthritis    Diabetes mellitus, type II (HCC)    Hypertension    Hyperthyroidism    Hypothyroidism    Past Surgical History:  Procedure Laterality Date   ABDOMINAL HYSTERECTOMY     Patient Active Problem List   Diagnosis Date Noted   Memory loss 10/07/2023   Paresthesias 10/07/2023   Lumbar radiculopathy 10/07/2023   Chronic pain of multiple joints 12/02/2022   Acquired hypothyroidism 04/24/2022   Gastroesophageal reflux disease without esophagitis 04/22/2022   Anxiety 04/22/2022   Diabetes mellitus type 2 with complications (HCC) 10/21/2021   Hypertension associated with diabetes (HCC) 10/21/2021   OAB (overactive bladder) 04/15/2020   Toxic multinodul goiter 09/26/2018    PCP: Severa Rock HERO, FNP  REFERRING PROVIDER: Debby Dorn MATSU, MD   REFERRING DIAG: Radiculopathy, lumbar region   Rationale for Evaluation and Treatment: Rehabilitation  THERAPY DIAG:  Other low back pain  Muscle weakness (generalized)  ONSET DATE: January 2024  SUBJECTIVE:                                                                                                                                                                                           SUBJECTIVE STATEMENT: Patient reports that she feels alright today. She feels that some days are better than others regarding her pain. She feels that some days she feels like  therapy has helped, but some days are not as good. She feels that she may be about 75% better.   PERTINENT HISTORY:  Hypertension, diabetes type 2, anxiety, and arthritis  PAIN:  Are you having pain? No  PRECAUTIONS: None  RED FLAGS: None   WEIGHT BEARING RESTRICTIONS: No  FALLS:  Has patient fallen in last 6 months? No  LIVING ENVIRONMENT: Lives with: lives alone Lives in: House/apartment Stairs: Yes: Internal: 12 steps; can reach both; reciprocal pattern, always uses both rails Has following equipment at home: None  OCCUPATION:  retired  PLOF: Independent  PATIENT GOALS: reduced pain   NEXT MD VISIT: 11/15/23  OBJECTIVE:  Note: Objective measures were completed at Evaluation unless otherwise noted.  DIAGNOSTIC FINDINGS: 07/19/23 lumbar MRI IMPRESSION: 1. Progressive advanced facet hypertrophy and grade 1 anterolisthesis at L4-5 with progressive uncovering of a broad-based disc protrusion. This leads to progressive moderate central canal stenosis with left greater than right subarticular stenosis. 2. Mild to moderate foraminal narrowing bilaterally at L4-5. 3. Shallow central disc protrusion at L5-S1 potentially contacts the traversing S1 nerve roots bilaterally. 4. New leftward disc protrusion at L3-4 extends into the left neural foramen without significant focal stenosis.  SCREENING FOR RED FLAGS: Bowel or bladder incontinence: No Spinal tumors: No Cauda equina syndrome: No Compression fracture: No Abdominal aneurysm: No  COGNITION: Overall cognitive status: Within functional limits for tasks assessed     SENSATION: Light touch: Impaired  and reduced sensation in left S1 dermatome Patient reports that she has numbness in both legs.   POSTURE: forward head  PALPATION: TTP: L4 spinous process   LUMBAR JOINT MOBILITY:  L1-4: WFL and nonpainful   L4-5: WFL and painful   LUMBAR ROM:   AROM eval  Flexion WFL (able to touch her toes)  Extension 10   Right lateral flexion 28; familiar pain  Left lateral flexion 22  Right rotation 25% limitation  Left rotation 50% limitation   (Blank rows = not tested)  LOWER EXTREMITY MMT:    MMT Right eval Left eval  Hip flexion 4-/5 4-/5  Hip extension    Hip abduction    Hip adduction    Hip internal rotation    Hip external rotation    Knee flexion 4/5 4/5  Knee extension 4-/5 4/5  Ankle dorsiflexion 4/5 4/5  Ankle plantarflexion    Ankle inversion    Ankle eversion     (Blank rows = not tested)  FUNCTIONAL TESTS:  5 times sit to stand: 19.08 seconds without UE support Timed up and go (TUG): 9.72 seconds  GAIT: Assistive device utilized: None Level of assistance: Complete Independence Comments: no significant gait deviations observed  TODAY'S TREATMENT:                                                                                                                              DATE:                                     10/29/23 EXERCISE LOG  Exercise Repetitions and Resistance Comments  Recumbent bike L3 x 16 minutes    Standing heel raise  25 reps    Standing toe raise  25 reps    Lateral step up  6 step x 2 minutes each   Chair taps  5 reps     Blank cell = exercise not performed today  10/18/23 EXERCISE LOG  Exercise Repetitions and Resistance Comments  Recumbent bike  L3 x 17 minutes   Rocker board  5 minutes  BUE support  Eccentric heel tap  6 step x 20 reps each    Standing hip ABD 1.5 minutes each    Standing HS curl  20 reps each  Intermittent UE support   NBOS on foam  2 minutes  Eyes closed; intermittent UE support   Blank cell = exercise not performed today                                     EXERCISE LOG  Exercise Repetitions and Resistance Comments  Recumbent Bike Lvl 3; seat 3 x 17 mins   Rockerboard 5 mins   Side-Stepping 5 mins   Tandem Gait 4 mins   NBOS    Tandem    LAQs 3# x 20 reps bil   Seated Marches  3# x 20 reps bil   Seated Ham Curls Green x 20 reps bil    Blank cell = exercise not performed today   PATIENT EDUCATION:  Education details: HEP, prognosis, and progress toward her goals Person educated: Patient Education method: Explanation Education comprehension: verbalized understanding  HOME EXERCISE PROGRAM: [email or web address]  ASSESSMENT:  CLINICAL IMPRESSION: Patient has made good progress with skilled physical therapy as evidenced by her subjective reports, objective measures, functional mobility, and progress toward her goals. She was able to demonstrate a significant improvement in her five time sit to stand time, but she was unable to meet her goal of 12 seconds or less. Her HEP was updated and she reported feeling comfortable with these interventions. She reported feeling comfortable being discharged at this time.   OBJECTIVE IMPAIRMENTS: decreased strength and pain.   ACTIVITY LIMITATIONS: locomotion level  PARTICIPATION LIMITATIONS: community activity  PERSONAL FACTORS: 3+ comorbidities: Hypertension, diabetes type 2, anxiety, and arthritis  are also affecting patient's functional outcome.   REHAB POTENTIAL: Good  CLINICAL DECISION MAKING: Stable/uncomplicated  EVALUATION COMPLEXITY: Low   GOALS: Goals reviewed with patient? Yes  LONG TERM GOALS: Target date: 10/20/23  Patient will be independent with her HEP.  Baseline: I have not doing my exercises at home  Goal status: NOT MET  2.  Patient will improve her five time sit to stand time to 12 seconds or less for improved lower extremity power.  Baseline: 19.04 second (1st attempt) 13.31 seconds (2nd attempt)  Goal status: NEARLY MET  3.  Patient will improve her bilateral hip flexor strength to at least 4/5 for improved lower extremity strength.  Baseline: 4/5 bilaterally Goal status: MET  PLAN:  PT FREQUENCY: 1-2x/week  PT DURATION: 4 weeks  PLANNED INTERVENTIONS: 97110-Therapeutic exercises, 97530-  Therapeutic activity, [id number]- Neuromuscular re-education, 97535- Self Care, [id number]- Manual therapy, [id number]- Electrical stimulation (unattended), Patient/Family education, Balance training, Spinal mobilization, Cryotherapy, and Moist heat.  PLAN FOR NEXT SESSION: Recumbent bike, lumbar stabilization, lower trunk rotations, manual therapy, and modalities as needed   Lacinda JAYSON Fass, PT 10/29/2023, 12:48 PM

## 2023-11-03 ENCOUNTER — Ambulatory Visit (INDEPENDENT_AMBULATORY_CARE_PROVIDER_SITE_OTHER): Payer: Medicare Other | Admitting: Physician Assistant

## 2023-11-03 ENCOUNTER — Encounter: Payer: Self-pay | Admitting: Physician Assistant

## 2023-11-03 VITALS — BP 118/67 | HR 88 | Resp 20 | Ht 61.0 in | Wt 147.0 lb

## 2023-11-03 DIAGNOSIS — R413 Other amnesia: Secondary | ICD-10-CM

## 2023-11-03 MED ORDER — DONEPEZIL HCL 5 MG PO TABS: 5.0000 mg | ORAL_TABLET | Freq: Every day | ORAL | 11 refills | Status: DC

## 2023-11-03 NOTE — Progress Notes (Signed)
 Assessment/Plan:   Memory impairment, concerning for dementia of unclear etiology   Andrea Shannon is a very pleasant 78 y.o. RH female with a history of hypertension, hypothyroidism, GERD, arthritis, vitamin D  deficiency, iron  deficiency anemia DM with neuropathy, seen today in follow up for memory loss.  She is able to participate on her ADLs, and drive without difficulties.  Patient is currently not on any antidementia medication.  As recalled, the patient was seen prior for neuropathy and was started on Cymbalta  which she has now discontinued, not interested in resuming it. discussed initiating therapy with [facility], patient agrees to proceed.  MMSE today is 28/30.   Follow up in 6 months. Start donepezil  5 mg milligrams daily as directed, side effects discussed. Recommend good control of her cardiovascular risk factors Continue to control mood as per PCP Neuropsych evaluation for diagnostic clarity     Bilateral Leg paresthesias and pain    EMG-NCS 06/03/2023 was negative for large fiber neuropathy. MRI of the lumbar spine without contrast remarkable for significant lumbar stenosis/radiculopathy . Patient decided against using Cymbalta  and would prefer to do PT and will entertain lumbar injections if indicated by neurosurgery. Continue follow-up with neurosurgery Continue B12 supplements Continue iron  supplement Recommend good control of A1c Follow-up thyroid  disease   Subjective:    This patient is here alone.  Previous records as well as any outside records available were reviewed prior to todays visit. Patient was last seen on 07/29/2023, last MoCA on 05/14/2023 was 18/30    Any changes in memory since last visit? " Worse"  "I can't think!". Has more difficulty with names of people that she knows.  She tries to stay active, do some brain stimulating exercises. repeats oneself?  Endorsed Disoriented when walking into a room?  Patient denies    Leaving objects?  May misplace  things but not in unusual places   Wandering behavior?  denies   Any personality changes since last visit?  denies   Any worsening depression?:  Denies.   Hallucinations or paranoia?  Denies.   Seizures? denies    Any sleep changes?  Denies vivid dreams, REM behavior or sleepwalking   Sleep apnea?   Denies.   Any hygiene concerns? Denies.  Independent of bathing and dressing?  Endorsed  Does the patient needs help with medications?  Patient is in charge   Who is in charge of the finances?   Patient is in charge     Any changes in appetite?  denies    Patient have trouble swallowing? Denies.   Does the patient cook? No Any headaches?   denies   Chronic pain?  Endorsed, the patient attend neurosurgical visit, has tried physical therapy, may entertain injections in the future if needed.  She tries to minimize the use of medications.  At 1 point she was on Cymbalta , but she discontinued upon finishing the medicine container. Ambulates with difficulty? Denies.  Walks 2 days a week at the [facility], "moving helps.  " Recent falls or head injuries? denies     Unilateral weakness, numbness or tingling?  She has a history of peripheral neuropathy, as above, she has declined continuing on Cymbalta , not interested in any other medications for now. Any tremors?  Denies   Any anosmia?  Denies   Any incontinence of urine?  Denies Any bowel dysfunction?   Denies      Patient lives alone Does the patient drive? Denies any issues    MRI of the brain  results from 06/09/2023,  personally reviewed, remarkable for mild generalized cerebral and cerebellar atrophy, otherwise unremarkable noncontrast MRI appearance of the brain for age        Initial visit 05/14/23 How long did patient have memory difficulties? "For years need a little time to remember but I do". Patient denies difficulty remembering recent conversations and has more difficulty with  people names but  "I am good at remembering faces" repeats  oneself?  Endorsed Disoriented when walking into a room?  Patient denies except occasionally not remembering what patient came to the room for    Leaving objects in unusual places? denies   Wandering behavior?  denies   Any personality changes?   " I have mood swings but not as bad as when in menopause" Any history of depression?: Endorsed, dating back for "many years, but I know how to get myself out of it"  Hallucinations or paranoia?  Patient denies   Seizures?   Patient denies    Any sleep changes? " I don't  sleep that well". I have vivid dreams, denies REM behavior or sleepwalking   Sleep apnea?  Patient denies   Any hygiene concerns?  Patient denies   Independent of bathing and dressing?  Endorsed  Does the patient needs help with medications? Patient is in charge   Who is in charge of the finances? Patient is in charge     Any changes in appetite? " Not too good because of my GERD" Patient have trouble swallowing? denies   Does the patient cook? Nothing much .   Any kitchen accidents such as leaving the stove on? denies   Any headaches?   denies   Chronic back pain ? denies   Ambulates with difficulty?  denies   Recent falls or head injuries? denies   Vision changes?"I have cataracts'  Unilateral weakness, numbness or tingling?  She has a history of paresthesias in the setting of DM2.  She is not on any medications for this, tried gabapentin  but was "too sleepy" with it.  For the last 2 years she reports a burning cold sensation in the feet, sometimes with leg cramps. Feeling is intermittent She is not sure what makes it better or worse. She states that her feet stay cold all day . "Support socks help a little".  Denies imbalance when walking.  The patient has not  noticed any recent skin rashes , fever, night sweats, anorexia or unintentional weight loss  Any tremors?   denies   Any anosmia?  denies   Any incontinence of urine?  She has a history of bladder prolapse.  Any bowel  dysfunction? denies      Patient lives alone History of heavy alcohol  intake? denies   History of heavy tobacco use? denies   Family history of dementia? denies  Does patient drive? Yes no issues    Pertinent labs 04/2023: A1c 6.3 (elevated), CRP 10.6 (mildly elevated), ESR 2, uric acid 6.3, B1 15, HIV negative, copper  115, SPEP negative, ANA negative, B6 32 (elevated), Lyme disease negative, latest CBC August 2024 H&H 10.4-33.4 respectively, with low MCV 76, platelets 487, suspicious for microcytic anemia.   EMG remarkable for chronic L3-L4 radiculopathy affecting bilateral lower extremities, mild.  There is no evidence of large fiber sensorimotor polyneuropathy affecting the lower extremities.   MRI L spine wo contrast 07/29/23 personally reviewed shows 1. Progressive advanced facet hypertrophy and grade 1anterolisthesis at L4-5 with progressive uncovering of a broad-based disc protrusion. This leads  to progressive moderate central canal stenosis with left greater than right subarticular stenosis.2. Mild to moderate foraminal narrowing bilaterally at L4-5.3. Shallow central disc protrusion at L5-S1 potentially contacts the traversing S1 nerve roots bilaterally.4. New leftward disc protrusion at L3-4 extends into the left neural foramen without significant focal stenosis.  PREVIOUS MEDICATIONS:   CURRENT MEDICATIONS:  Outpatient Encounter Medications as of 11/03/2023  Medication Sig   aspirin 81 MG chewable tablet Chew by mouth daily.   diclofenac  (VOLTAREN ) 75 MG EC tablet Take 1 tablet (75 mg total) by mouth 2 (two) times daily.   donepezil  (ARICEPT ) 5 MG tablet Take 1 tablet (5 mg total) by mouth daily.   DULoxetine  (CYMBALTA ) 30 MG capsule Take 1 capsule (30 mg total) by mouth daily.   hydrOXYzine  (ATARAX ) 10 MG tablet Take 1 tablet (10 mg total) by mouth 3 (three) times daily as needed for anxiety.   levothyroxine  (SYNTHROID ) 125 MCG tablet TAKE 1/2 TABLET BY MOUTH EVERY MORNING BEFORE  BREAKFAST   lisinopril -hydrochlorothiazide  (ZESTORETIC ) 20-12.5 MG tablet Take 1 tablet by mouth daily.   metFORMIN  (GLUCOPHAGE ) 500 MG tablet Take 1 tablet (500 mg total) by mouth 2 (two) times daily with a meal. TAKE 1 TABLET BY MOUTH TWICE A DAY WITH A MEAL   pantoprazole  (PROTONIX ) 40 MG tablet Take 1 tablet (40 mg total) by mouth daily.   sucralfate  (CARAFATE ) 1 g tablet Take 1 tablet (1 g total) by mouth 2 (two) times daily.   No facility-administered encounter medications on file as of 11/03/2023.       11/03/2023   12:00 PM  MMSE - Mini Mental State Exam  Orientation to time 5  Orientation to Place 5  Registration 3  Attention/ Calculation 5  Recall 2  Language- name 2 objects 2  Language- repeat 1  Language- follow 3 step command 3  Language- read & follow direction 1  Write a sentence 1  Copy design 0  Total score 28      05/14/2023    5:00 PM  Montreal Cognitive Assessment   Visuospatial/ Executive (0/5) 2  Naming (0/3) 2  Attention: Read list of digits (0/2) 2  Attention: Read list of letters (0/1) 0  Attention: Serial 7 subtraction starting at 100 (0/3) 0  Language: Repeat phrase (0/2) 1  Language : Fluency (0/1) 0  Abstraction (0/2) 2  Delayed Recall (0/5) 2  Orientation (0/6) 6  Total 17  Adjusted Score (based on education) 18    Objective:     PHYSICAL EXAMINATION:    VITALS:   Vitals:   11/03/23 1100  BP: 118/67  Pulse: 88  Resp: 20  SpO2: 99%  Weight: 147 lb (66.7 kg)  Height: 5\' 1"  (1.549 m)    GEN:  The patient appears stated age and is in NAD. HEENT:  Normocephalic, atraumatic.   Neurological examination:  General: NAD, well-groomed, appears stated age. Orientation: The patient is alert. Oriented to person, place and date Cranial nerves: There is good facial symmetry.The speech is fluent and clear. No aphasia or dysarthria. Fund of knowledge is appropriate. Recent and remote memory are impaired. Attention and concentration are  reduced.  Able to name objects and repeat phrases.  Hearing is intact to conversational tone.  Sensation: Remarkable for pain, numbness and tingling at the bilateral lower extremities, worse at her toes, described as stabbing. Motor: Strength is at least antigravity x4. DTR's 2/4 in UE/LE     Movement examination: Tone: There is normal tone in  the UE/LE Abnormal movements:  no tremor.  No myoclonus.  No asterixis.   Coordination:  There is no decremation with RAM's. Normal finger to nose  Gait and Station: The patient has some difficulty arising out of a deep-seated chair without the use of the hands. The patient's stride length is good.  Gait is cautious and narrow.    Thank you for allowing us  the opportunity to participate in the care of this nice patient. Please do not hesitate to contact us  for any questions or concerns.   Total time spent on today's visit was 30 minutes dedicated to this patient today, preparing to see patient, examining the patient, ordering tests and/or medications and counseling the patient, documenting clinical information in the EHR or other health record, independently interpreting results and communicating results to the patient/family, discussing treatment and goals, answering patient's questions and coordinating care.  Cc:  Galvin Jules, FNP  Tex Filbert 11/03/2023 12:52 PM

## 2023-11-03 NOTE — Patient Instructions (Addendum)
 It was a pleasure to see you today at our office.   Recommendations:  Start donepezil  5 mg daily in the morning  Follow up in 6 months  Neuropsychological evaluation  Continue B12 for neuropathy Continue follow up  Neurosurgery for back problems  Take you iron  for iron  deficiency anemia  Monitor your sugars    For psychiatric meds, mood meds: Please have your primary care physician manage these medications.  If you have any severe symptoms of a stroke, or other severe issues such as confusion,severe chills or fever, etc call 911 or go to the ER as you may need to be evaluated further     For assessment of decision of mental capacity and competency:  Call Dr. Laverne Potter, geriatric psychiatrist at [phone]    Whom to call: Memory  decline, memory medications: Call our office [phone]    [email or web address]   RECOMMENDATIONS FOR ALL PATIENTS WITH MEMORY PROBLEMS: 1. Continue to exercise (Recommend 30 minutes of walking everyday, or 3 hours every week) 2. Increase social interactions - continue going to [place] and enjoy social gatherings with friends and family 3. Eat healthy, avoid fried foods and eat more fruits and vegetables 4. Maintain adequate blood pressure, blood sugar, and blood cholesterol level. Reducing the risk of stroke and cardiovascular disease also helps promoting better memory. 5. Avoid stressful situations. Live a simple life and avoid aggravations. Organize your time and prepare for the next day in anticipation. 6. Sleep well, avoid any interruptions of sleep and avoid any distractions in the bedroom that may interfere with adequate sleep quality 7. Avoid sugar, avoid sweets as there is a strong link between excessive sugar intake, diabetes, and cognitive impairment We discussed the Mediterranean diet, which has been shown to help patients reduce the risk of progressive memory disorders and reduces  cardiovascular risk. This includes eating fish, eat fruits and green leafy vegetables, nuts like almonds and hazelnuts, walnuts, and also use olive oil. Avoid fast foods and fried foods as much as possible. Avoid sweets and sugar as sugar use has been linked to worsening of memory function.  There is always a concern of gradual progression of memory problems. If this is the case, then we may need to adjust level of care according to patient needs. Support, both to the patient and caregiver, should then be put into place.      You have been referred for a neuropsychological evaluation (i.e., evaluation of memory and thinking abilities). Please bring someone with you to this appointment if possible, as it is helpful for the doctor to hear from both you and another adult who knows you well. Please bring eyeglasses and hearing aids if you wear them.    The evaluation will take approximately 3 hours and has two parts:   The first part is a clinical interview with the neuropsychologist (Dr. Kitty Perkins or Dr. Annette Barters). During the interview, the neuropsychologist will speak with you and the individual you brought to the appointment.    The second part of the evaluation is testing with the doctor's technician Bernabe Brew or Burdette Carolin). During the testing, the technician will ask you to remember different types of material, solve problems, and answer some questionnaires. Your family member will not be present for this portion of the evaluation.   Please note: We must reserve several hours of the neuropsychologist's time and the psychometrician's time for your evaluation appointment. As such, there is a No-Show fee of $100. If you are unable to attend any  of your appointments, please contact our office as soon as possible to reschedule.      DRIVING: Regarding driving, in patients with progressive memory problems, driving will be impaired. We advise to have someone else do the driving if trouble finding directions or if  minor accidents are reported. Independent driving assessment is available to determine safety of driving.   If you are interested in the driving assessment, you can contact the following:  [facility] in [place] [phone]  [facility] [phone]  [facility] [phone]  [facility] [phone] or [phone]   FALL PRECAUTIONS: Be cautious when walking. Scan the area for obstacles that may increase the risk of trips and falls. When getting up in the mornings, sit up at the edge of the bed for a few minutes before getting out of bed. Consider elevating the bed at the head end to avoid drop of blood pressure when getting up. Walk always in a well-lit room (use night lights in the walls). Avoid area rugs or power cords from appliances in the middle of the walkways. Use a walker or a cane if necessary and consider physical therapy for balance exercise. Get your eyesight checked regularly.  FINANCIAL OVERSIGHT: Supervision, especially oversight when making financial decisions or transactions is also recommended.  HOME SAFETY: Consider the safety of the kitchen when operating appliances like stoves, microwave oven, and blender. Consider having supervision and share cooking responsibilities until no longer able to participate in those. Accidents with firearms and other hazards in the house should be identified and addressed as well.   ABILITY TO BE LEFT ALONE: If patient is unable to contact 911 operator, consider using LifeLine, or when the need is there, arrange for someone to stay with patients. Smoking is a fire hazard, consider supervision or cessation. Risk of wandering should be assessed by caregiver and if detected at any point, supervision and safe proof recommendations should be instituted.  MEDICATION SUPERVISION: Inability to self-administer medication needs to be constantly addressed. Implement a mechanism to ensure safe  administration of the medications.      Mediterranean Diet A Mediterranean diet refers to food and lifestyle choices that are based on the traditions of countries located on the [place]. This way of eating has been shown to help prevent certain conditions and improve outcomes for people who have chronic diseases, like kidney disease and heart disease. What are tips for following this plan? Lifestyle  Cook and eat meals together with your family, when possible. Drink enough fluid to keep your urine clear or pale yellow. Be physically active every day. This includes: Aerobic exercise like running or swimming. Leisure activities like gardening, walking, or housework. Get 7-8 hours of sleep each night. If recommended by your health care provider, drink red wine in moderation. This means 1 glass a day for nonpregnant women and 2 glasses a day for men. A glass of wine equals 5 oz (150 mL). Reading food labels  Check the serving size of packaged foods. For foods such as rice and pasta, the serving size refers to the amount of cooked product, not dry. Check the total fat in packaged foods. Avoid foods that have saturated fat or trans fats. Check the ingredients list for added sugars, such as corn syrup. Shopping  At the grocery store, buy most of your food from the areas near the walls of the store. This includes: Fresh fruits and vegetables (produce). Grains, beans, nuts, and seeds. Some of these may  be available in unpackaged forms or large amounts (in bulk). Fresh seafood. Poultry and eggs. Low-fat dairy products. Buy whole ingredients instead of prepackaged foods. Buy fresh fruits and vegetables in-season from local farmers markets. Buy frozen fruits and vegetables in resealable bags. If you do not have access to quality fresh seafood, buy precooked frozen shrimp or canned fish, such as tuna, salmon, or sardines. Buy small amounts of raw or cooked vegetables, salads, or olives  from the deli or salad bar at your store. Stock your pantry so you always have certain foods on hand, such as olive oil, canned tuna, canned tomatoes, rice, pasta, and beans. Cooking  Cook foods with extra-virgin olive oil instead of using butter or other vegetable oils. Have meat as a side dish, and have vegetables or grains as your main dish. This means having meat in small portions or adding small amounts of meat to foods like pasta or stew. Use beans or vegetables instead of meat in common dishes like chili or lasagna. Experiment with different cooking methods. Try roasting or broiling vegetables instead of steaming or sauteing them. Add frozen vegetables to soups, stews, pasta, or rice. Add nuts or seeds for added healthy fat at each meal. You can add these to yogurt, salads, or vegetable dishes. Marinate fish or vegetables using olive oil, lemon juice, garlic, and fresh herbs. Meal planning  Plan to eat 1 vegetarian meal one day each week. Try to work up to 2 vegetarian meals, if possible. Eat seafood 2 or more times a week. Have healthy snacks readily available, such as: Vegetable sticks with hummus. Greek yogurt. Fruit and nut trail mix. Eat balanced meals throughout the week. This includes: Fruit: 2-3 servings a day Vegetables: 4-5 servings a day Low-fat dairy: 2 servings a day Fish, poultry, or lean meat: 1 serving a day Beans and legumes: 2 or more servings a week Nuts and seeds: 1-2 servings a day Whole grains: 6-8 servings a day Extra-virgin olive oil: 3-4 servings a day Limit red meat and sweets to only a few servings a month What are my food choices? Mediterranean diet Recommended Grains: Whole-grain pasta. Brown rice. Bulgar wheat. Polenta. Couscous. Whole-wheat bread. Dwyane Glad. Vegetables: Artichokes. Beets. Broccoli. Cabbage. Carrots. Eggplant. Green beans. Chard. Kale. Spinach. Onions. Leeks. Peas. Squash. Tomatoes. Peppers. Radishes. Fruits: Apples.  Apricots. Avocado. Berries. Bananas. Cherries. Dates. Figs. Grapes. Lemons. Melon. Oranges. Peaches. Plums. Pomegranate. Meats and other protein foods: Beans. Almonds. Sunflower seeds. Pine nuts. Peanuts. Cod. Salmon. Scallops. Shrimp. Tuna. Tilapia. Clams. Oysters. Eggs. Dairy: Low-fat milk. Cheese. Greek yogurt. Beverages: Water. Red wine. Herbal tea. Fats and oils: Extra virgin olive oil. Avocado oil. Grape seed oil. Sweets and desserts: [place] yogurt with honey. Baked apples. Poached pears. Trail mix. Seasoning and other foods: Basil. Cilantro. Coriander. Cumin. Mint. Parsley. Sage. Rosemary. Tarragon. Garlic. Oregano. Thyme. Pepper. Balsalmic vinegar. Tahini. Hummus. Tomato sauce. Olives. Mushrooms. Limit these Grains: Prepackaged pasta or rice dishes. Prepackaged cereal with added sugar. Vegetables: Deep fried potatoes (french fries). Fruits: Fruit canned in syrup. Meats and other protein foods: Beef. Pork. Lamb. Poultry with skin. Hot dogs. Helene Loader. Dairy: Ice cream. Sour cream. Whole milk. Beverages: Juice. Sugar-sweetened soft drinks. Beer. Liquor and spirits. Fats and oils: Butter. Canola oil. Vegetable oil. Beef fat (tallow). Lard. Sweets and desserts: Cookies. Cakes. Pies. Candy. Seasoning and other foods: Mayonnaise. Premade sauces and marinades. The items listed may not be a complete list. Talk with your dietitian about what dietary choices are right for you. Summary  The Mediterranean diet includes both food and lifestyle choices. Eat a variety of fresh fruits and vegetables, beans, nuts, seeds, and whole grains. Limit the amount of red meat and sweets that you eat. Talk with your health care provider about whether it is safe for you to drink red wine in moderation. This means 1 glass a day for nonpregnant women and 2 glasses a day for men. A glass of wine equals 5 oz (150 mL). This information is not intended to replace advice given to you by your health care provider. Make sure  you discuss any questions you have with your health care provider.     Labs today [address] [facility] [phone]

## 2023-11-10 ENCOUNTER — Other Ambulatory Visit: Payer: Self-pay | Admitting: *Deleted

## 2023-11-10 ENCOUNTER — Other Ambulatory Visit: Payer: Self-pay | Admitting: Family Medicine

## 2023-11-10 DIAGNOSIS — E118 Type 2 diabetes mellitus with unspecified complications: Secondary | ICD-10-CM

## 2023-11-10 DIAGNOSIS — E0520 Thyrotoxicosis with toxic multinodular goiter without thyrotoxic crisis or storm: Secondary | ICD-10-CM

## 2023-11-10 MED ORDER — METFORMIN HCL 500 MG PO TABS: 500.0000 mg | ORAL_TABLET | Freq: Two times a day (BID) | ORAL | 0 refills | Status: DC

## 2023-11-10 MED ORDER — LEVOTHYROXINE SODIUM 125 MCG PO TABS: ORAL_TABLET | ORAL | 2 refills | Status: DC

## 2023-11-10 NOTE — Telephone Encounter (Signed)
Copied from CRM [id number]. Topic: Clinical - Medication Refill >> Nov 10, 2023  3:02 PM Elle L wrote: Most Recent Primary Care Visit:  Provider: Sonny Masters  Department: [facility]  Visit Type: PHYSICAL  Date: 10/07/2023  Medication: metFORMIN (GLUCOPHAGE) 500 MG tablet  Has the patient contacted their pharmacy? Yes advised to contact provider.  Is this the correct pharmacy for this prescription? Yes  This is the patient's preferred pharmacy:   [facility] - [place], [place] - [address] [facility] [address] [place] [address] [place] [address] Phone: [phone] Fax: [phone]   Has the prescription been filled recently? No  Is the patient out of the medication? No  Has the patient been seen for an appointment in the last year OR does the patient have an upcoming appointment? Yes  Can we respond through MyChart? No  Agent: Please be advised that Rx refills may take up to 3 business days. We ask that you follow-up with your pharmacy.

## 2023-11-12 ENCOUNTER — Other Ambulatory Visit: Payer: Self-pay | Admitting: *Deleted

## 2023-11-12 ENCOUNTER — Telehealth: Payer: Self-pay

## 2023-11-12 DIAGNOSIS — E118 Type 2 diabetes mellitus with unspecified complications: Secondary | ICD-10-CM

## 2023-11-12 MED ORDER — METFORMIN HCL 500 MG PO TABS: 500.0000 mg | ORAL_TABLET | Freq: Two times a day (BID) | ORAL | 0 refills | Status: DC

## 2023-11-12 NOTE — Telephone Encounter (Signed)
Script resent to [employer] as requested

## 2023-11-12 NOTE — Telephone Encounter (Signed)
Copied from CRM [id number]. Topic: Clinical - Prescription Issue >> Nov 12, 2023  8:36 AM Herbert Seta B wrote: Reason for CRM: metFORMIN (GLUCOPHAGE) 500 MG tablet [[id number]] RX sent to incorrect pharmacy-patient wants [facility] used.

## 2023-11-15 DIAGNOSIS — M5416 Radiculopathy, lumbar region: Secondary | ICD-10-CM | POA: Diagnosis not present

## 2023-11-15 DIAGNOSIS — Z6826 Body mass index (BMI) 26.0-26.9, adult: Secondary | ICD-10-CM | POA: Diagnosis not present

## 2023-11-22 ENCOUNTER — Telehealth: Payer: Self-pay

## 2023-11-22 NOTE — Telephone Encounter (Signed)
Copied from CRM [id number]. Topic: Clinical - Request for Lab/Test Order >> Nov 22, 2023  4:08 PM Andrea Shannon wrote: Reason for CRM: Patient wants to know if there is a local lab she can go to for her doctor at [facility], please call [phone]

## 2023-11-22 NOTE — Telephone Encounter (Signed)
 Spoke with pt and lab appt made

## 2023-11-25 ENCOUNTER — Telehealth: Payer: Self-pay | Admitting: Nurse Practitioner

## 2023-11-25 NOTE — Telephone Encounter (Signed)
 No I can use the one from December

## 2023-11-25 NOTE — Telephone Encounter (Signed)
 Pt did thyroid  panel in Dec.  Do you still want labs done again for appt on 12/07/23

## 2023-11-30 ENCOUNTER — Other Ambulatory Visit: Payer: Medicare Other

## 2023-12-01 ENCOUNTER — Other Ambulatory Visit (HOSPITAL_COMMUNITY)
Admission: RE | Admit: 2023-12-01 | Discharge: 2023-12-01 | Disposition: A | Discharge status: Home / Self Care | Payer: Medicare Other | Source: Ambulatory Visit | Attending: Internal Medicine | Admitting: Internal Medicine

## 2023-12-01 DIAGNOSIS — E890 Postprocedural hypothyroidism: Secondary | ICD-10-CM | POA: Diagnosis not present

## 2023-12-01 LAB — TSH: TSH: 0.555 u[IU]/mL (ref 0.350–4.500)

## 2023-12-01 LAB — T4, FREE: Free T4: 0.92 ng/dL (ref 0.61–1.12)

## 2023-12-03 ENCOUNTER — Telehealth: Payer: Self-pay | Admitting: Physician Assistant

## 2023-12-03 NOTE — Telephone Encounter (Signed)
Pt said the medication sara put her on is not agreeing with her. Bad cramps in leg, upset stomach, HA, just not feeling herself. She has been on it 14 days. Donezpezil is the medication

## 2023-12-06 NOTE — Telephone Encounter (Signed)
 Pt called informed to hold medication for 2 weeks give a call back let us know if symptoms have stopped so we will know if we are going to restart they medication,

## 2023-12-06 NOTE — Patient Instructions (Signed)

## 2023-12-07 ENCOUNTER — Ambulatory Visit (INDEPENDENT_AMBULATORY_CARE_PROVIDER_SITE_OTHER): Payer: Medicare Other | Admitting: Nurse Practitioner

## 2023-12-07 ENCOUNTER — Encounter: Payer: Self-pay | Admitting: Nurse Practitioner

## 2023-12-07 VITALS — BP 128/76 | HR 72 | Ht 63.0 in | Wt 148.6 lb

## 2023-12-07 DIAGNOSIS — E890 Postprocedural hypothyroidism: Secondary | ICD-10-CM

## 2023-12-07 DIAGNOSIS — E0520 Thyrotoxicosis with toxic multinodular goiter without thyrotoxic crisis or storm: Secondary | ICD-10-CM | POA: Diagnosis not present

## 2023-12-07 DIAGNOSIS — E559 Vitamin D deficiency, unspecified: Secondary | ICD-10-CM | POA: Diagnosis not present

## 2023-12-07 MED ORDER — LEVOTHYROXINE SODIUM 125 MCG PO TABS
ORAL_TABLET | ORAL | 2 refills | Status: DC
Start: 2023-12-07 — End: 2024-03-27

## 2023-12-07 NOTE — Progress Notes (Signed)
 12/07/2023, 3:27 PM     Endocrinology follow-up note    Subjective:    Patient ID: Ayelen Sciortino, female    DOB: 1946-07-27, PCP Rakes, Doralee Albino, FNP   Past Medical History:  Diagnosis Date   Acid reflux    Arthritis    Diabetes mellitus, type II (HCC)    Hypertension    Hyperthyroidism    Hypothyroidism    Past Surgical History:  Procedure Laterality Date   ABDOMINAL HYSTERECTOMY     Social History   Socioeconomic History   Marital status: Widowed    Spouse name: Not on file   Number of children: 1   Years of education: 12   Highest education level: High school graduate  Occupational History   Not on file  Tobacco Use   Smoking status: Never   Smokeless tobacco: Never  Vaping Use   Vaping status: Never Used  Substance and Sexual Activity   Alcohol use: Not Currently   Drug use: Never   Sexual activity: Not Currently    Birth control/protection: Surgical  Other Topics Concern   Not on file  Social History Narrative   Right handed   Drinks caffeine coffee   Lives alone   Retired   One son   Social Drivers of Corporate investment banker Strain: Low Risk  (01/06/2023)   Overall Financial Resource Strain (CARDIA)    Difficulty of Paying Living Expenses: Not hard at all  Food Insecurity: No Food Insecurity (01/06/2023)   Hunger Vital Sign    Worried About Running Out of Food in the Last Year: Never true    Ran Out of Food in the Last Year: Never true  Transportation Needs: No Transportation Needs (01/06/2023)   PRAPARE - Administrator, Civil Service (Medical): No    Lack of Transportation (Non-Medical): No  Physical Activity: Insufficiently Active (01/06/2023)   Exercise Vital Sign    Days of Exercise per Week: 3 days    Minutes of Exercise per Session: 30 min  Stress: No Stress Concern Present (01/06/2023)   [employer] of Occupational Health - Occupational Stress Questionnaire     Feeling of Stress : Not at all  Social Connections: Moderately Integrated (01/06/2023)   Social Connection and Isolation Panel [NHANES]    Frequency of Communication with Friends and Family: More than three times a week    Frequency of Social Gatherings with Friends and Family: More than three times a week    Attends Religious Services: More than 4 times per year    Active Member of [employer] or Organizations: Yes    Attends Banker Meetings: More than 4 times per year    Marital Status: Widowed   Outpatient Encounter Medications as of 12/07/2023  Medication Sig   aspirin 81 MG chewable tablet Chew by mouth daily.   diclofenac (VOLTAREN) 75 MG EC tablet Take 1 tablet (75 mg total) by mouth 2 (two) times daily.   DULoxetine (CYMBALTA) 30 MG capsule Take 1 capsule (30 mg total) by mouth daily.   hydrOXYzine (ATARAX) 10 MG tablet Take 1 tablet (10 mg total) by mouth 3 (three) times daily as needed for anxiety.   lisinopril-hydrochlorothiazide (  ZESTORETIC) 20-12.5 MG tablet Take 1 tablet by mouth daily.   metFORMIN (GLUCOPHAGE) 500 MG tablet Take 1 tablet (500 mg total) by mouth 2 (two) times daily with a meal.   pantoprazole (PROTONIX) 40 MG tablet Take 1 tablet (40 mg total) by mouth daily.   sucralfate (CARAFATE) 1 g tablet Take 1 tablet (1 g total) by mouth 2 (two) times daily.   [DISCONTINUED] levothyroxine (SYNTHROID) 125 MCG tablet Take 1/2 Tablet by mouth every morning before breakfast.   donepezil (ARICEPT) 5 MG tablet Take 1 tablet (5 mg total) by mouth daily. (Patient not taking: Reported on 12/07/2023)   levothyroxine (SYNTHROID) 125 MCG tablet Take 1/2 Tablet by mouth every morning before breakfast.   No facility-administered encounter medications on file as of 12/07/2023.   ALLERGIES: No Known Allergies  VACCINATION STATUS:  There is no immunization history on file for this patient.  Thyroid Problem Presents for follow-up (-She was treated with I-131 on September 29, 2019 for toxic multinodular goiter.   She reports family history of thyroid dysfunction, family history of thyroid malignancy.) visit. Symptoms include heat intolerance. Patient reports no anxiety, cold intolerance, constipation, depressed mood, diarrhea, fatigue, leg swelling, palpitations, tremors, weight gain or weight loss. (Says recently she has been excessively sweating, hot flashes, even without doing anything to exert such.  She notes night sweats as well.  She did tell me she is trying to use up her supply of 50 mcg pills of her Levothyroxine, so has been splitting tablets to equal the dose she needs and when it splits, it does not split evenly.) The symptoms have been stable.     Octa Uplinger is 78 y.o. female who presents today with a medical history as above.  Review of systems  Constitutional: + Minimally fluctuating body weight,  current Body mass index is 26.32 kg/m. , no fatigue, no subjective hyperthermia, no subjective hypothermia Eyes: no blurry vision, no xerophthalmia ENT: no sore throat, no nodules palpated in throat, no dysphagia/odynophagia, no hoarseness Cardiovascular: no chest pain, no shortness of breath, no palpitations, no leg swelling Respiratory: no cough, no shortness of breath Gastrointestinal: no nausea/vomiting/diarrhea Musculoskeletal: no muscle/joint aches Skin: no rashes, no hyperemia Neurological: no tremors, no numbness, no tingling, no dizziness Psychiatric: no depression, no anxiety  Objective:    BP 128/76 (BP Location: Left Arm, Patient Position: Sitting, Cuff Size: Large)   Pulse 72   Ht 5\' 3"  (1.6 m)   Wt 148 lb 9.6 oz (67.4 kg)   BMI 26.32 kg/m   Wt Readings from Last 3 Encounters:  12/07/23 148 lb 9.6 oz (67.4 kg)  11/03/23 147 lb (66.7 kg)  10/07/23 145 lb (65.8 kg)     BP Readings from Last 3 Encounters:  12/07/23 128/76  11/03/23 118/67  10/07/23 127/62     Physical Exam- Limited  Constitutional:  Body mass index is  26.32 kg/m. , not in acute distress, normal state of mind Eyes:  EOMI, no exophthalmos Musculoskeletal: no gross deformities, strength intact in all four extremities, no gross restriction of joint movements Skin:  no rashes, no hyperemia Neurological: no tremor with outstretched hands   Recent Results (from the past 2160 hours)  Bayer DCA Hb A1c Waived     Status: None   Collection Time: 10/07/23 10:25 AM  Result Value Ref Range   HB A1C (BAYER DCA - WAIVED) 5.5 4.8 - 5.6 %    Comment:          Prediabetes:  5.7 - 6.4          Diabetes: >6.4          Glycemic control for adults with diabetes: <7.0   Lipid panel     Status: Abnormal   Collection Time: 10/07/23 10:27 AM  Result Value Ref Range   Cholesterol, Total 155 100 - 199 mg/dL   Triglycerides 161 (H) 0 - 149 mg/dL   HDL 52 >09 mg/dL   VLDL Cholesterol Cal 28 5 - 40 mg/dL   LDL Chol Calc (NIH) 75 0 - 99 mg/dL   Chol/HDL Ratio 3.0 0.0 - 4.4 ratio    Comment:                                   T. Chol/HDL Ratio                                             Men  Women                               1/2 Avg.Risk  3.4    3.3                                   Avg.Risk  5.0    4.4                                2X Avg.Risk  9.6    7.1                                3X Avg.Risk 23.4   11.0   CBC with Differential/Platelet     Status: Abnormal   Collection Time: 10/07/23 10:27 AM  Result Value Ref Range   WBC 5.4 3.4 - 10.8 x10E3/uL   RBC 4.02 3.77 - 5.28 x10E6/uL   Hemoglobin 9.7 (L) 11.1 - 15.9 g/dL   Hematocrit 60.4 (L) 54.0 - 46.6 %   MCV 79 79 - 97 fL   MCH 24.1 (L) 26.6 - 33.0 pg   MCHC 30.5 (L) 31.5 - 35.7 g/dL   RDW 98.1 (H) 19.1 - 47.8 %   Platelets 507 (H) 150 - 450 x10E3/uL   Neutrophils 66 Not Estab. %   Lymphs 26 Not Estab. %   Monocytes 6 Not Estab. %   Eos 2 Not Estab. %   Basos 0 Not Estab. %   Neutrophils Absolute 3.6 1.4 - 7.0 x10E3/uL   Lymphocytes Absolute 1.4 0.7 - 3.1 x10E3/uL   Monocytes Absolute 0.3  0.1 - 0.9 x10E3/uL   EOS (ABSOLUTE) 0.1 0.0 - 0.4 x10E3/uL   Basophils Absolute 0.0 0.0 - 0.2 x10E3/uL   Immature Granulocytes 0 Not Estab. %   Immature Grans (Abs) 0.0 0.0 - 0.1 x10E3/uL  Thyroid Panel With TSH     Status: Abnormal   Collection Time: 10/07/23 10:27 AM  Result Value Ref Range   TSH 0.431 (L) 0.450 - 4.500 uIU/mL   T4, Total 8.5 4.5 - 12.0 ug/dL   T3 Uptake Ratio 26 24 - 39 %  Free Thyroxine Index 2.2 1.2 - 4.9  CMP14+EGFR     Status: Abnormal   Collection Time: 10/07/23 10:27 AM  Result Value Ref Range   Glucose 178 (H) 70 - 99 mg/dL   BUN 12 8 - 27 mg/dL   Creatinine, Ser 1.61 (H) 0.57 - 1.00 mg/dL   eGFR 49 (L) >09 UE/AVW/0.98   BUN/Creatinine Ratio 10 (L) 12 - 28   Sodium 141 134 - 144 mmol/L   Potassium 4.5 3.5 - 5.2 mmol/L   Chloride 103 96 - 106 mmol/L   CO2 22 20 - 29 mmol/L   Calcium 9.6 8.7 - 10.3 mg/dL   Total Protein 6.2 6.0 - 8.5 g/dL   Albumin 4.1 3.8 - 4.8 g/dL   Globulin, Total 2.1 1.5 - 4.5 g/dL   Bilirubin Total <1.1 0.0 - 1.2 mg/dL   Alkaline Phosphatase 76 44 - 121 IU/L   AST 16 0 - 40 IU/L   ALT 12 0 - 32 IU/L  Microalbumin / creatinine urine ratio     Status: None   Collection Time: 10/07/23 10:57 AM  Result Value Ref Range   Creatinine, Urine 75.5 Not Estab. mg/dL   Microalbumin, Urine 3.4 Not Estab. ug/mL   Microalb/Creat Ratio 5 0 - 29 mg/g creat    Comment:                        Normal:                0 -  29                        Moderately increased: 30 - 300                        Severely increased:       >300   TSH     Status: None   Collection Time: 12/01/23 11:58 AM  Result Value Ref Range   TSH 0.555 0.350 - 4.500 uIU/mL    Comment: Performed by a 3rd Generation assay with a functional sensitivity of <=0.01 uIU/mL. Performed at [facility], [address] [place], [place] [address]   T4, free     Status: None   Collection Time: 12/01/23 11:58 AM  Result Value Ref Range   Free T4 0.92 0.61 - 1.12 ng/dL     Comment: (NOTE) Biotin ingestion may interfere with free T4 tests. If the results are inconsistent with the TSH level, previous test results, or the clinical presentation, then consider biotin interference. If needed, order repeat testing after stopping biotin. Performed at [facility], [address] [place], [place] [address]    Ultrasound of the thyroid on June 27, 2018 showed right lobe measuring 4.9 x 1.9 x 2.1 cm with 1 dominant mixed cystic and solid nodule measuring 2.2 x 1.5 x 1.8 cm.  Another nodule in the superior right lobe measuring 1.9 x 0.9 x 1.2 cm. Left lobe heterogeneous measuring 4.7 x 3.2 x 2.3 cm with dominant mixed cystic and solid nodule in the central left lobe measuring 2.6 x 2.4 x 2.3 cm.  Separate mixed cystic and solid nodule in the superior left lobe measuring 1.7 x 1.3 x 1.4 cm. Impression:  innumerable thyroid nodules, increasing probability of benignity.  2 suspicious right thyroid nodule measuring 1.9 x 0.9 x 1.2 cm in the superior right lobe.  Ultrasound-guided FNA is  suggested.  More suspicious left thyroid nodule in the superior left lobe measuring 2.6 x 2.4 x 2.3 cm.  Ultrasound-guided FNA suggested.   Fine-needle aspiration on November 30, 2018 revealed atypia of undetermined significance,  afirma testing-reported benign, with malignancy risk less than 4%.     Thyroid uptake and scan on September 04, 2019 showed enlarged thyroid gland with several focal areas of increased uptake, likely representing nodules.  No areas of photopenia.  24-hour uptake 35%.     US Thyroid 11/15/2018 Narrative & Impression  CLINICAL DATA:  Multinodular goiter   EXAM: THYROID ULTRASOUND   TECHNIQUE: Ultrasound examination of the thyroid gland and adjacent soft tissues was performed.   COMPARISON:  06/27/2018 by report only from [facility]   FINDINGS: Parenchymal Echotexture: Moderately heterogenous   Isthmus: 0.3 cm thickness, stable    Right lobe: 5.6 x 2.3 x 2 cm, previously 4.9 x 1.9 x 2.1   Left lobe: 6.1 x 3.3 x 2.3 cm, previously 4.7 x 3.2 x 2.3   _________________________________________________________   Estimated total number of nodules >/= 1 cm: 6-10   Number of spongiform nodules >/=  2 cm not described below (TR1): 0   Number of mixed cystic and solid nodules >/= 1.5 cm not described below (TR2): 0   _________________________________________________________   Nodule # 1:   Location: Right; Superior   Maximum size: 1.4 cm; Other 2 dimensions: 1.1 x 1 cm   Composition: mixed cystic and solid (1)   Echogenicity: hypoechoic (2)   Shape: not taller-than-wide (0)   Margins: ill-defined (0)   Echogenic foci: none (0)   ACR TI-RADS total points: 3.   ACR TI-RADS risk category: TR3 (3 points).   ACR TI-RADS recommendations:   Given size (<1.4 cm) and appearance, this nodule does NOT meet TI-RADS criteria for biopsy or dedicated follow-up.   _________________________________________________________   Nodule # 2: 2.7 x 1.9 x 2 cm spongiform nodule, mid right; This nodule does NOT meet TI-RADS criteria for biopsy or dedicated follow-up.   Nodule # 3: 1.5 x 0.9 x 1.2 cm spongiform nodule, inferior right; This nodule does NOT meet TI-RADS criteria for biopsy or dedicated follow-up.   Nodule # 4:   Location: Left; Superior   Maximum size: 2 cm; Other 2 dimensions: 1.7 x 1.7 cm   Composition: mixed cystic and solid (1)   Echogenicity: hypoechoic (2)   Shape: not taller-than-wide (0)   Margins: smooth (0)   Echogenic foci: none (0)   ACR TI-RADS total points: 3.   ACR TI-RADS risk category: TR3 (3 points).   ACR TI-RADS recommendations:   *Given size (>/= 1.5 - 2.4 cm) and appearance, a follow-up ultrasound in 1 year should be considered based on TI-RADS criteria.   _________________________________________________________   Nodule # 5:   Location: Left; Mid   Maximum  size: 3 cm; Other 2 dimensions: 2.8 x 1.9 cm   Composition: mixed cystic and solid (1)   Echogenicity: isoechoic (1)   Shape: not taller-than-wide (0)   Margins: smooth (0)   Echogenic foci: none (0)   ACR TI-RADS total points: 2.   ACR TI-RADS risk category: TR2 (2 points).   ACR TI-RADS recommendations:   This nodule does NOT meet TI-RADS criteria for biopsy or dedicated follow-up.   _________________________________________________________   Nodule # 6:   Location: Left; Inferior   Maximum size: 3.2 cm; Other 2 dimensions: 2.7 x 1.9 cm   Composition: mixed cystic and solid (1)  Echogenicity: isoechoic (1)   Shape: not taller-than-wide (0)   Margins: smooth (0)   Echogenic foci: none (0)   ACR TI-RADS total points: 2.   ACR TI-RADS risk category: TR2 (2 points).   ACR TI-RADS recommendations:   This nodule does NOT meet TI-RADS criteria for biopsy or dedicated follow-up.   _________________________________________________________   Nodule # 7:   Location: Left; Inferior posterior   Maximum size: 3 cm; Other 2 dimensions: 2.9 x 2.3 cm   Composition: solid/almost completely solid (2)   Echogenicity: isoechoic (1)   Shape: taller-than-wide (3)   Margins: ill-defined (0)   Echogenic foci: none (0)   ACR TI-RADS total points: 6.   ACR TI-RADS risk category: TR4 (4-6 points).   ACR TI-RADS recommendations:   **Given size (>/= 1.5 cm) and appearance, fine needle aspiration of this moderately suspicious nodule should be considered based on TI-RADS criteria.   IMPRESSION: 1. Thyromegaly with bilateral nodules. 2. Recommend FNA biopsy of moderately suspicious 3 cm inferior posterior nodule #7; This was not described on the prior study. 3. Recommend annual/biennial ultrasound follow-up of additional lesion as above, until stability x5 years confirmed.    -------------------------------------------------------------------------------------------------------------------------------  FOLLOW UP THYROID US FROM 12/02/20 CLINICAL DATA:  Hypothyroidism. Left inferior thyroid nodule biopsy on 11/30/2018.   EXAM: THYROID ULTRASOUND   TECHNIQUE: Ultrasound examination of the thyroid gland and adjacent soft tissues was performed.   COMPARISON:  11/15/2018   FINDINGS: Parenchymal Echotexture: Moderately heterogenous   Isthmus: 0.2 cm, previously 0.3 cm   Right lobe: 4.9 x 1.7 x 1.6 cm, previously 5.6 x 2.3 x 2.0 cm   Left lobe: 5.1 x 2.2 x 1.7 cm, previously 6.1 x 2.8 x 2.3 cm   _________________________________________________________   Estimated total number of nodules >/= 1 cm: 5   Number of spongiform nodules >/=  2 cm not described below (TR1): 0   Number of mixed cystic and solid nodules >/= 1.5 cm not described below (TR2): 0   _________________________________________________________   Again noted are multiple right thyroid nodules. The right thyroid nodules are predominantly cystic or spongiform.   Nodule 2 in the right mid thyroid lobe has markedly decreased in size. This is a spongiform or mixed cystic and solid nodule that measures 1.3 x 0.9 x 0.9 cm and previously measured 2.7 x 1.9 x 2.0 cm.   Nodule 3 in the right inferior thyroid is mixed cystic and solid nodule that measures roughly 1.5 x 1.0 x 1.2 cm and previously measured 1.3 x 1.2 x 0.9 cm.   Multiple left thyroid nodules that are predominantly mixed cystic and solid nodules.   Nodule 5 in the left mid thyroid lobe measures 1.3 x 0.5 x 0.5 cm and previously measured 2.8 x 1.9 x 3.0 cm. This is a mixed cystic and solid nodule and the solid portion is isoechoic.   Nodule 6 is a mixed cystic and solid nodule in the left inferior thyroid lobe measures 1.6 x 0.9 x 1.5 cm and previously measured 2.7 x 1.9 x 3.2 cm.   Nodule 7 in the left inferior  thyroid lobe represents the previously biopsied nodule. This nodule is slightly hypoechoic and has scattered cystic areas. This nodule measures 2.1 x 2.0 x 1.5 cm and previously measured 3.0 x 2.9 x 2.3 cm.   IMPRESSION: 1. Multinodular goiter. 2. Most of the nodules are mixed cystic and solid composition. Many of the nodules have decreased in size as described. 3. No new suspicious thyroid  nodules.   The above is in keeping with the ACR TI-RADS recommendations - J Am Coll Radiol 2017;14:587-595.     Electronically Signed   By: Richarda Overlie M.D.   On: 12/02/2020 14:20   Latest Reference Range & Units 01/13/23 11:56 04/29/23 12:54 07/30/23 14:00 07/30/23 14:01 10/07/23 10:27 12/01/23 11:58  TSH 0.350 - 4.500 uIU/mL  0.327 (L) 0.620  0.431 (L) 0.555  T4,Free(Direct) 0.61 - 1.12 ng/dL 1.61 0.96 (H)  0.45  4.09  Thyroxine (T4) 4.5 - 12.0 ug/dL     8.5   Free Thyroxine Index 1.2 - 4.9      2.2   T3 Uptake Ratio 24 - 39 %     26   (L): Data is abnormally low (H): Data is abnormally high  Assessment & Plan:   1. RAI - induced hypothyroidism -She status post I-131 thyroid ablation on September 29, 2019.    -Her previsit TFTs are consistent with appropriate hormone replacement.  She is advised to continue her Levothyroxine 62.5 mcg po daily before breakfast (1/2 of a 125 mcg tab).  75 mcg is too much, 50 mcg was not enough.   - We discussed about the correct intake of her thyroid hormone, on empty stomach at fasting, with water, separated by at least 30 minutes from breakfast and other medications,  and separated by more than 4 hours from calcium, iron, multivitamins, acid reflux medications (PPIs). -Patient is made aware of the fact that thyroid hormone replacement is needed for life, dose to be adjusted by periodic monitoring of thyroid function tests.  2.  Multinodular goiter -Her fine-needle aspiration was significant for atypia of undetermined significance, a sample was sent for  afirma testing-with grossly benign finding with malignancy is less than 4%.  She will not need surgical intervention at this time.    -Her thyroid US from 10/2018 shows multiple nodules which recommended follow up with [place] in 1 year. Her repeat thyroid US from 11/2020 shows that all nodules have decreased in size and there are no new nodules.  No additional surveillance recommended at this time.    - I advised her  to maintain close follow up with Rakes, Doralee Albino, FNP for primary care needs.      I spent  22  minutes in the care of the patient today including review of labs from Thyroid Function, CMP, and other relevant labs ; imaging/biopsy records (current and previous including abstractions from other facilities); face-to-face time discussing  her lab results and symptoms, medications doses, her options of short and long term treatment based on the latest standards of care / guidelines;   and documenting the encounter.  [name]  participated in the discussions, expressed understanding, and voiced agreement with the above plans.  All questions were answered to her satisfaction. she is encouraged to contact clinic should she have any questions or concerns prior to her return visit.  Follow up plan: Return in about 6 months (around 06/05/2024) for Thyroid follow up, Previsit labs.   Ronny Bacon, [facility] [address] [place], [place] [address] Phone: [phone] Fax: [phone]   12/07/2023, 3:27 PM

## 2023-12-16 ENCOUNTER — Telehealth: Payer: Self-pay | Admitting: Physician Assistant

## 2023-12-16 NOTE — Telephone Encounter (Signed)
 Pt called in stating she has stopped taking the donepezil for about 10 days now and her leg cramps are gone

## 2023-12-27 ENCOUNTER — Other Ambulatory Visit: Payer: Self-pay | Admitting: Family Medicine

## 2023-12-27 DIAGNOSIS — K219 Gastro-esophageal reflux disease without esophagitis: Secondary | ICD-10-CM

## 2023-12-27 NOTE — Telephone Encounter (Signed)
 Copied from CRM [id number]. Topic: Clinical - Medication Refill >> Dec 27, 2023  8:35 AM Nyra Capes wrote: Most Recent Primary Care Visit:   Medication: pantoprazole (PROTONIX) 40 MG tablet   Has the patient contacted their pharmacy? No (Agent: If no, request that the patient contact the pharmacy for the refill. If patient does not wish to contact the pharmacy document the reason why and proceed with request.) (Agent: If yes, when and what did the pharmacy advise?)  Is this the correct pharmacy for this prescription? No If no, delete pharmacy and type the correct one.  This is the patient's preferred pharmacy:   [facility] - [place], [place] - [address] [facility] [address] [place] [address] [place] [address] Phone: [phone] Fax: [phone]  Has the prescription been filled recently? Yes  Is the patient out of the medication? No  Has the patient been seen for an appointment in the last year OR does the patient have an upcoming appointment? Yes  Can we respond through MyChart? No  Agent: Please be advised that Rx refills may take up to 3 business days. We ask that you follow-up with your pharmacy.

## 2023-12-28 MED ORDER — PANTOPRAZOLE SODIUM 40 MG PO TBEC: 40.0000 mg | DELAYED_RELEASE_TABLET | Freq: Every day | ORAL | 3 refills | Status: DC

## 2023-12-29 DIAGNOSIS — M5416 Radiculopathy, lumbar region: Secondary | ICD-10-CM | POA: Diagnosis not present

## 2024-01-06 ENCOUNTER — Ambulatory Visit: Payer: Medicare Other | Admitting: Family Medicine

## 2024-01-06 ENCOUNTER — Encounter: Payer: Self-pay | Admitting: Family Medicine

## 2024-01-06 VITALS — BP 119/72 | HR 76 | Temp 98.1°F | Ht 63.0 in | Wt 145.8 lb

## 2024-01-06 DIAGNOSIS — E039 Hypothyroidism, unspecified: Secondary | ICD-10-CM

## 2024-01-06 DIAGNOSIS — M2550 Pain in unspecified joint: Secondary | ICD-10-CM

## 2024-01-06 DIAGNOSIS — I152 Hypertension secondary to endocrine disorders: Secondary | ICD-10-CM | POA: Diagnosis not present

## 2024-01-06 DIAGNOSIS — E1159 Type 2 diabetes mellitus with other circulatory complications: Secondary | ICD-10-CM

## 2024-01-06 DIAGNOSIS — D638 Anemia in other chronic diseases classified elsewhere: Secondary | ICD-10-CM | POA: Diagnosis not present

## 2024-01-06 DIAGNOSIS — K219 Gastro-esophageal reflux disease without esophagitis: Secondary | ICD-10-CM

## 2024-01-06 DIAGNOSIS — E118 Type 2 diabetes mellitus with unspecified complications: Secondary | ICD-10-CM | POA: Diagnosis not present

## 2024-01-06 DIAGNOSIS — R42 Dizziness and giddiness: Secondary | ICD-10-CM

## 2024-01-06 DIAGNOSIS — M5416 Radiculopathy, lumbar region: Secondary | ICD-10-CM | POA: Diagnosis not present

## 2024-01-06 DIAGNOSIS — Z7984 Long term (current) use of oral hypoglycemic drugs: Secondary | ICD-10-CM | POA: Diagnosis not present

## 2024-01-06 DIAGNOSIS — G8929 Other chronic pain: Secondary | ICD-10-CM

## 2024-01-06 LAB — BAYER DCA HB A1C WAIVED: HB A1C (BAYER DCA - WAIVED): 6 % — ABNORMAL HIGH (ref 4.8–5.6)

## 2024-01-06 MED ORDER — PANTOPRAZOLE SODIUM 40 MG PO TBEC: 40.0000 mg | DELAYED_RELEASE_TABLET | Freq: Every day | ORAL | 0 refills | Status: DC

## 2024-01-06 MED ORDER — MECLIZINE HCL 12.5 MG PO TABS: 12.5000 mg | ORAL_TABLET | Freq: Three times a day (TID) | ORAL | 2 refills | Status: DC | PRN

## 2024-01-06 MED ORDER — PANTOPRAZOLE SODIUM 40 MG PO TBEC: 40.0000 mg | DELAYED_RELEASE_TABLET | Freq: Every day | ORAL | 3 refills | Status: DC

## 2024-01-06 NOTE — Progress Notes (Addendum)
Subjective:  Patient ID: Andrea Shannon, female    DOB: 1946/06/09, 78 y.o.   MRN: [id number]  Patient Care Team: Sonny Masters, FNP as PCP - General (Family Medicine) Babs Bertin, OD (Optometry) Dani Gobble, NP as Nurse Practitioner (Nurse Practitioner) Adam Phenix, DPM as Consulting Physician (Podiatry) Elwyn Reach (Neurology)   Chief Complaint:  Diabetes (3 month follow up) and Cyst (Patient states there is a knot on her right thigh that was noticed a month ago. )   HPI: Andrea Shannon is a 78 y.o. female presenting on 01/06/2024 for Diabetes (3 month follow up) and Cyst (Patient states there is a knot on her right thigh that was noticed a month ago. )   Discussed the use of AI scribe software for clinical note transcription with the patient, who gave verbal consent to proceed.  History of Present Illness   Andrea Shannon is a 78 year old female with neuropathy and diabetes who presents for follow-up on her chronic conditions.  She experiences ongoing neuropathy, particularly in her feet, described as 'pinched nerves.' Walking helps alleviate her symptoms. She previously tried duloxetine for neuropathy but discontinued it due to severe leg cramps. She is considering an epidural injection for pain relief but is hesitant due to a dislike of needles.  Her diabetes is well-controlled with metformin 500 mg twice daily, and her recent A1c was 6.0. No headaches, chest pain, or blood pressure issues are reported.  She has a history of thyroid issues and recently saw her provider, Alphonzo Lemmings, who confirmed that her thyroid management is going well. She opted for a six-month follow-up instead of a year.  She attempted to use donepezil for memory issues but could not tolerate it. She takes diclofenac every morning for joint pain, which helps her manage her daily activities despite waking up with pain.  She takes vitamin B12 and multivitamins and is considering  discontinuing B12 after her current supply runs out. She also takes iron tablets (325 mg) and is considering switching to gummy iron supplements.  She experiences vertigo, which she associates with sinus and allergy issues. Vertigo episodes occur suddenly.  She has a history of osteoarthritis, confirmed by negative ANA tests ruling out rheumatoid arthritis.  She experiences hot flashes and sweating, which she attributes to menopause and thyroid issues. She has tried black cohosh in the past for relief.  She has a knot on her leg from hitting it against a table, which has reduced in size with the application of rubbing alcohol.          Relevant past medical, surgical, family, and social history reviewed and updated as indicated.  Allergies and medications reviewed and updated. Data reviewed: Chart in Epic.   Past Medical History:  Diagnosis Date   Acid reflux    Arthritis    Diabetes mellitus, type II (HCC)    Hypertension    Hyperthyroidism    Hypothyroidism     Past Surgical History:  Procedure Laterality Date   ABDOMINAL HYSTERECTOMY      Social History   Socioeconomic History   Marital status: Widowed    Spouse name: Not on file   Number of children: 1   Years of education: 12   Highest education level: High school graduate  Occupational History   Not on file  Tobacco Use   Smoking status: Never   Smokeless tobacco: Never  Vaping Use   Vaping status: Never Used  Substance and  Sexual Activity   Alcohol use: Not Currently   Drug use: Never   Sexual activity: Not Currently    Birth control/protection: Surgical  Other Topics Concern   Not on file  Social History Narrative   Right handed   Drinks caffeine coffee   Lives alone   Retired   One son   Social Drivers of Corporate investment banker Strain: Low Risk  (01/06/2023)   Overall Financial Resource Strain (CARDIA)    Difficulty of Paying Living Expenses: Not hard at all  Food Insecurity: No Food  Insecurity (01/06/2023)   Hunger Vital Sign    Worried About Running Out of Food in the Last Year: Never true    Ran Out of Food in the Last Year: Never true  Transportation Needs: No Transportation Needs (01/06/2023)   PRAPARE - Administrator, Civil Service (Medical): No    Lack of Transportation (Non-Medical): No  Physical Activity: Insufficiently Active (01/06/2023)   Exercise Vital Sign    Days of Exercise per Week: 3 days    Minutes of Exercise per Session: 30 min  Stress: No Stress Concern Present (01/06/2023)   [employer] of Occupational Health - Occupational Stress Questionnaire    Feeling of Stress : Not at all  Social Connections: Moderately Integrated (01/06/2023)   Social Connection and Isolation Panel [NHANES]    Frequency of Communication with Friends and Family: More than three times a week    Frequency of Social Gatherings with Friends and Family: More than three times a week    Attends Religious Services: More than 4 times per year    Active Member of [employer] or Organizations: Yes    Attends Banker Meetings: More than 4 times per year    Marital Status: Widowed  Intimate Partner Violence: Not At Risk (01/06/2023)   Humiliation, Afraid, Rape, and Kick questionnaire    Fear of Current or Ex-Partner: No    Emotionally Abused: No    Physically Abused: No    Sexually Abused: No    Outpatient Encounter Medications as of 01/06/2024  Medication Sig   aspirin 81 MG chewable tablet Chew by mouth daily.   diclofenac (VOLTAREN) 75 MG EC tablet Take 1 tablet (75 mg total) by mouth 2 (two) times daily.   levothyroxine (SYNTHROID) 125 MCG tablet Take 1/2 Tablet by mouth every morning before breakfast.   lisinopril-hydrochlorothiazide (ZESTORETIC) 20-12.5 MG tablet Take 1 tablet by mouth daily.   meclizine (ANTIVERT) 12.5 MG tablet Take 1 tablet (12.5 mg total) by mouth 3 (three) times daily as needed for dizziness.   metFORMIN (GLUCOPHAGE) 500 MG tablet  Take 1 tablet (500 mg total) by mouth 2 (two) times daily with a meal.   sucralfate (CARAFATE) 1 g tablet Take 1 tablet (1 g total) by mouth 2 (two) times daily.   vitamin B-12 (CYANOCOBALAMIN) 100 MCG tablet Take 100 mcg by mouth daily.   [DISCONTINUED] pantoprazole (PROTONIX) 40 MG tablet Take 1 tablet (40 mg total) by mouth daily.   pantoprazole (PROTONIX) 40 MG tablet Take 1 tablet (40 mg total) by mouth daily.   [DISCONTINUED] donepezil (ARICEPT) 5 MG tablet Take 1 tablet (5 mg total) by mouth daily. (Patient not taking: Reported on 12/07/2023)   [DISCONTINUED] DULoxetine (CYMBALTA) 30 MG capsule Take 1 capsule (30 mg total) by mouth daily.   [DISCONTINUED] hydrOXYzine (ATARAX) 10 MG tablet Take 1 tablet (10 mg total) by mouth 3 (three) times daily as needed for anxiety.   [  DISCONTINUED] pantoprazole (PROTONIX) 40 MG tablet Take 1 tablet (40 mg total) by mouth daily.   No facility-administered encounter medications on file as of 01/06/2024.    No Known Allergies  Pertinent ROS per HPI, otherwise unremarkable      Objective:  BP 119/72   Pulse 76   Temp 98.1 F (36.7 C)   Ht 5\' 3"  (1.6 m)   Wt 145 lb 12.8 oz (66.1 kg)   SpO2 99%   BMI 25.83 kg/m    Wt Readings from Last 3 Encounters:  01/06/24 145 lb 12.8 oz (66.1 kg)  12/07/23 148 lb 9.6 oz (67.4 kg)  11/03/23 147 lb (66.7 kg)    Physical Exam Vitals and nursing note reviewed.  Constitutional:      General: She is not in acute distress.    Appearance: Normal appearance. She is not ill-appearing, toxic-appearing or diaphoretic.  HENT:     Head: Normocephalic and atraumatic.     Nose: Nose normal.     Mouth/Throat:     Mouth: Mucous membranes are moist.  Eyes:     Conjunctiva/sclera: Conjunctivae normal.     Pupils: Pupils are equal, round, and reactive to light.  Cardiovascular:     Rate and Rhythm: Normal rate and regular rhythm.     Heart sounds: Normal heart sounds.  Pulmonary:     Effort: Pulmonary effort is  normal.     Breath sounds: Normal breath sounds.  Musculoskeletal:     Cervical back: Normal range of motion and neck supple.     Right lower leg: No edema.     Left lower leg: No edema.  Skin:    General: Skin is warm and dry.     Capillary Refill: Capillary refill takes less than 2 seconds.       Neurological:     General: No focal deficit present.     Mental Status: She is alert and oriented to person, place, and time.  Psychiatric:        Mood and Affect: Mood normal.        Thought Content: Thought content normal.        Judgment: Judgment normal.      Results for orders placed or performed during the hospital encounter of 12/01/23  TSH   Collection Time: 12/01/23 11:58 AM  Result Value Ref Range   TSH 0.555 0.350 - 4.500 uIU/mL  T4, free   Collection Time: 12/01/23 11:58 AM  Result Value Ref Range   Free T4 0.92 0.61 - 1.12 ng/dL       Pertinent labs & imaging results that were available during my care of the patient were reviewed by me and considered in my medical decision making.  Assessment & Plan:  Talaysha was seen today for diabetes and cyst.  Diagnoses and all orders for this visit:  Diabetes mellitus type 2 with complications (HCC) -     Bayer DCA Hb A1c Waived  Hypertension associated with diabetes (HCC) -     Bayer DCA Hb A1c Waived  Lumbar radiculopathy  Chronic pain of multiple joints  Acquired hypothyroidism  Anemia of chronic disease  Vertigo -     meclizine (ANTIVERT) 12.5 MG tablet; Take 1 tablet (12.5 mg total) by mouth 3 (three) times daily as needed for dizziness.  Gastroesophageal reflux disease without esophagitis -     Discontinue: pantoprazole (PROTONIX) 40 MG tablet; Take 1 tablet (40 mg total) by mouth daily. -     pantoprazole (  PROTONIX) 40 MG tablet; Take 1 tablet (40 mg total) by mouth daily.     Assessment and Plan    Peripheral Neuropathy Chronic peripheral neuropathy causing foot pain. She is considering an  epidural injection for pain relief but is hesitant due to needle aversion. The injection could provide relief for up to three months, with some cases experiencing longer relief. Currently managing with walking and not experiencing significant hindrance in daily activities. - Continue current management and consider epidural injection if pain becomes unmanageable.  Osteoarthritis Osteoarthritis characterized by joint wear and tear. ANA test negative for rheumatoid arthritis. - Continue diclofenac for pain management.  Diabetes Mellitus Type 2 Well-controlled diabetes with an A1c of 6.0. She is on metformin 500 mg twice daily. - Continue metformin 500 mg twice daily.  Hypertension Blood pressure is well-controlled with no symptoms such as headache or chest pain.  Hypothyroidism Well-managed hypothyroidism with follow-up every six months. - Continue current thyroid management regimen.  Iron Deficiency Anemia She is on iron tablets 325 mg but is considering switching to gummy iron supplements for better tolerance. - Switch to gummy iron supplements after current supply is finished.  Vitamin B12 Deficiency Vitamin B12 levels were normal in May 2024. Additional B12 supplementation is recommended for neuropathy management. Multivitamins do not provide sufficient B12 for her needs. - Continue vitamin B12 supplementation.  Vertigo Intermittent vertigo episodes, possibly related to sinus and allergy issues. She is aware of the drowsiness risk associated with vertigo medication. - Send prescription for Antivert to [facility] in [place].  Gastroesophageal Reflux Disease (GERD) She is on pantoprazole for acid reflux and is awaiting mail order delivery. She cannot run out of this medication due to significant stomach issues without it. - Send pantoprazole prescription to mail order pharmacy and [facility] pharmacy for backup.  Menopausal Symptoms Experiencing hot flashes and sweating,  possibly related to menopause or thyroid issues. She has tried black cohosh in the past. Allyne Gee is an option but requires liver function monitoring. - Try black cohosh again for hot flashes. - Consider Veozah if black cohosh is ineffective, with liver function monitoring.  General Health Maintenance Due for a mammogram in June. Advised to maintain hydration due to sweating and potential hyperhidrosis. - Schedule mammogram for June. - Encourage adequate hydration.  Follow-up Advised to return for follow-up in three months or sooner if there are any changes. - Schedule follow-up appointment in three months.        Total time spent with patient today was 42 minutes during which prior charts, labs, and imaging were reviewed along with current labs and plan of treatment.    Continue all other maintenance medications.  Follow up plan: Return in about 3 months (around 04/07/2024), or if symptoms worsen or fail to improve, for chronic follow up, all labs.   Continue healthy lifestyle choices, including diet (rich in fruits, vegetables, and lean proteins, and low in salt and simple carbohydrates) and exercise (at least 30 minutes of moderate physical activity daily).  Educational handout given for DM  The above assessment and management plan was discussed with the patient. The patient verbalized understanding of and has agreed to the management plan. Patient is aware to call the clinic if they develop any new symptoms or if symptoms persist or worsen. Patient is aware when to return to the clinic for a follow-up visit. Patient educated on when it is appropriate to go to the emergency department.   Kari Baars, FNP-C [facility]  [phone]  

## 2024-01-06 NOTE — Patient Instructions (Addendum)
 Black cohosh for hot flashes   Continue to monitor your blood sugars as we discussed and record them. Bring the log to your next appointment.  Take your medications as directed.    Goal Blood glucose:    Fasting (before meals) = 80 to 130   Within 2 hours of eating = less than 180   Understanding your Hemoglobin A1c:6.0     Diabetes Mellitus and Nutrition    I think that you would greatly benefit from seeing a nutritionist. If this is something you are interested in, please call Dr Gerilyn Pilgrim at [phone] to schedule an appointment.   When you have diabetes (diabetes mellitus), it is very important to have healthy eating habits because your blood sugar (glucose) levels are greatly affected by what you eat and drink. Eating healthy foods in the appropriate amounts, at about the same times every day, can help you: Control your blood glucose. Lower your risk of heart disease. Improve your blood pressure. Reach or maintain a healthy weight.  Every person with diabetes is different, and each person has different needs for a meal plan. Your health care provider may recommend that you work with a diet and nutrition specialist (dietitian) to make a meal plan that is best for you. Your meal plan may vary depending on factors such as: The calories you need. The medicines you take. Your weight. Your blood glucose, blood pressure, and cholesterol levels. Your activity level. Other health conditions you have, such as heart or kidney disease.  How do carbohydrates affect me? Carbohydrates affect your blood glucose level more than any other type of food. Eating carbohydrates naturally increases the amount of glucose in your blood. Carbohydrate counting is a method for keeping track of how many carbohydrates you eat. Counting carbohydrates is important to keep your blood glucose at a healthy level, especially if you use insulin or take certain oral diabetes medicines. It is important to know how  many carbohydrates you can safely have in each meal. This is different for every person. Your dietitian can help you calculate how many carbohydrates you should have at each meal and for snack. Foods that contain carbohydrates include: Bread, cereal, rice, pasta, and crackers. Potatoes and corn. Peas, beans, and lentils. Milk and yogurt. Fruit and juice. Desserts, such as cakes, cookies, ice cream, and candy.  How does alcohol affect me? Alcohol can cause a sudden decrease in blood glucose (hypoglycemia), especially if you use insulin or take certain oral diabetes medicines. Hypoglycemia can be a life-threatening condition. Symptoms of hypoglycemia (sleepiness, dizziness, and confusion) are similar to symptoms of having too much alcohol. If your health care provider says that alcohol is safe for you, follow these guidelines: Limit alcohol intake to no more than 1 drink per day for nonpregnant women and 2 drinks per day for men. One drink equals 12 oz of beer, 5 oz of wine, or 1 oz of hard liquor. Do not drink on an empty stomach. Keep yourself hydrated with water, diet soda, or unsweetened iced tea. Keep in mind that regular soda, juice, and other mixers may contain a lot of sugar and must be counted as carbohydrates.  What are tips for following this plan?  Reading food labels Start by checking the serving size on the label. The amount of calories, carbohydrates, fats, and other nutrients listed on the label are based on one serving of the food. Many foods contain more than one serving per package. Check the total grams (g) of carbohydrates  in one serving. You can calculate the number of servings of carbohydrates in one serving by dividing the total carbohydrates by 15. For example, if a food has 30 g of total carbohydrates, it would be equal to 2 servings of carbohydrates. Check the number of grams (g) of saturated and trans fats in one serving. Choose foods that have low or no amount of  these fats. Check the number of milligrams (mg) of sodium in one serving. Most people should limit total sodium intake to less than 2,300 mg per day. Always check the nutrition information of foods labeled as "low-fat" or "nonfat". These foods may be higher in added sugar or refined carbohydrates and should be avoided. Talk to your dietitian to identify your daily goals for nutrients listed on the label.  Shopping Avoid buying canned, premade, or processed foods. These foods tend to be high in fat, sodium, and added sugar. Shop around the outside edge of the grocery store. This includes fresh fruits and vegetables, bulk grains, fresh meats, and fresh dairy.  Cooking Use low-heat cooking methods, such as baking, instead of high-heat cooking methods like deep frying. Cook using healthy oils, such as olive, canola, or sunflower oil. Avoid cooking with butter, cream, or high-fat meats.  Meal planning Eat meals and snacks regularly, preferably at the same times every day. Avoid going long periods of time without eating. Eat foods high in fiber, such as fresh fruits, vegetables, beans, and whole grains. Talk to your dietitian about how many servings of carbohydrates you can eat at each meal. Eat 4-6 ounces of lean protein each day, such as lean meat, chicken, fish, eggs, or tofu. 1 ounce is equal to 1 ounce of meat, chicken, or fish, 1 egg, or 1/4 cup of tofu. Eat some foods each day that contain healthy fats, such as avocado, nuts, seeds, and fish.  Lifestyle  Check your blood glucose regularly. Exercise at least 30 minutes 5 or more days each week, or as told by your health care provider. Take medicines as told by your health care provider. Do not use any products that contain nicotine or tobacco, such as cigarettes and e-cigarettes. If you need help quitting, ask your health care provider. Work with a Veterinary surgeon or diabetes educator to identify strategies to manage stress and any emotional and  social challenges.  What are some questions to ask my health care provider? Do I need to meet with a diabetes educator? Do I need to meet with a dietitian? What number can I call if I have questions? When are the best times to check my blood glucose?  Where to find more information: American Diabetes Association: diabetes.org/food-and-fitness/food Academy of Nutrition and Dietetics: https://www.vargas.com/ [employer] of Diabetes and Digestive and Kidney Diseases (NIH): FindJewelers.cz  Summary A healthy meal plan will help you control your blood glucose and maintain a healthy lifestyle. Working with a diet and nutrition specialist (dietitian) can help you make a meal plan that is best for you. Keep in mind that carbohydrates and alcohol have immediate effects on your blood glucose levels. It is important to count carbohydrates and to use alcohol carefully. This information is not intended to replace advice given to you by your health care provider. Make sure you discuss any questions you have with your health care provider. Document Released: 07/02/2005 Document Revised: 11/09/2016 Document Reviewed: 11/09/2016 Elsevier Interactive Patient Education  [employer].

## 2024-01-11 ENCOUNTER — Ambulatory Visit (INDEPENDENT_AMBULATORY_CARE_PROVIDER_SITE_OTHER): Payer: Medicare Other

## 2024-01-11 VITALS — Ht 63.0 in | Wt 145.0 lb

## 2024-01-11 DIAGNOSIS — Z0000 Encounter for general adult medical examination without abnormal findings: Secondary | ICD-10-CM

## 2024-01-11 NOTE — Progress Notes (Signed)
 Subjective:   Andrea Shannon is a 78 y.o. who presents for a Medicare Wellness preventive visit.  Visit Complete: Virtual I connected with  [name] on 01/11/24 by a audio enabled telemedicine application and verified that I am speaking with the correct person using two identifiers.  Patient Location: Home  Provider Location: Home Office  I discussed the limitations of evaluation and management by telemedicine. The patient expressed understanding and agreed to proceed.  Vital Signs: Because this visit was a virtual/telehealth visit, some criteria may be missing or patient reported. Any vitals not documented were not able to be obtained and vitals that have been documented are patient reported.  VideoDeclined- This patient declined Librarian, academic. Therefore the visit was completed with audio only.  Persons Participating in Visit: Patient.  AWV Questionnaire: No: Patient Medicare AWV questionnaire was not completed prior to this visit.  Cardiac Risk Factors include: advanced age (>41men, >6 women);diabetes mellitus;hypertension     Objective:    Today's Vitals   01/11/24 1413  Weight: 145 lb (65.8 kg)  Height: 5\' 3"  (1.6 m)   Body mass index is 25.69 kg/m.     01/11/2024    2:18 PM 11/03/2023   11:04 AM 09/22/2023   12:07 PM 07/29/2023   11:07 AM 05/14/2023    1:18 PM 01/06/2023   11:49 AM 12/31/2021   11:26 AM  Advanced Directives  Does Patient Have a Medical Advance Directive? No No No No No No No  Would patient like information on creating a medical advance directive? Yes (MAU/Ambulatory/Procedural Areas - Information given) No - Patient declined  No - Patient declined No - Patient declined No - Patient declined No - Patient declined    Current Medications (verified) Outpatient Encounter Medications as of 01/11/2024  Medication Sig   aspirin 81 MG chewable tablet Chew by mouth daily.   diclofenac (VOLTAREN) 75 MG EC tablet Take  1 tablet (75 mg total) by mouth 2 (two) times daily.   levothyroxine (SYNTHROID) 125 MCG tablet Take 1/2 Tablet by mouth every morning before breakfast.   lisinopril-hydrochlorothiazide (ZESTORETIC) 20-12.5 MG tablet Take 1 tablet by mouth daily.   meclizine (ANTIVERT) 12.5 MG tablet Take 1 tablet (12.5 mg total) by mouth 3 (three) times daily as needed for dizziness.   metFORMIN (GLUCOPHAGE) 500 MG tablet Take 1 tablet (500 mg total) by mouth 2 (two) times daily with a meal.   pantoprazole (PROTONIX) 40 MG tablet Take 1 tablet (40 mg total) by mouth daily.   sucralfate (CARAFATE) 1 g tablet Take 1 tablet (1 g total) by mouth 2 (two) times daily.   vitamin B-12 (CYANOCOBALAMIN) 100 MCG tablet Take 100 mcg by mouth daily.   No facility-administered encounter medications on file as of 01/11/2024.    Allergies (verified) Patient has no known allergies.   History: Past Medical History:  Diagnosis Date   Acid reflux    Arthritis    Diabetes mellitus, type II (HCC)    Hypertension    Hyperthyroidism    Hypothyroidism    Past Surgical History:  Procedure Laterality Date   ABDOMINAL HYSTERECTOMY     Family History  Problem Relation Age of Onset   Diabetes Mother    Asthma Father    Heart disease Sister        CHF   Diabetes Sister    Asthma Sister    Arthritis Sister    Diabetes Son    Breast cancer Neg Hx  Social History   Socioeconomic History   Marital status: Widowed    Spouse name: Not on file   Number of children: 1   Years of education: 12   Highest education level: High school graduate  Occupational History   Not on file  Tobacco Use   Smoking status: Never   Smokeless tobacco: Never  Vaping Use   Vaping status: Never Used  Substance and Sexual Activity   Alcohol use: Not Currently   Drug use: Never   Sexual activity: Not Currently    Birth control/protection: Surgical  Other Topics Concern   Not on file  Social History Narrative   Right handed    Drinks caffeine coffee   Lives alone   Retired   One son   Social Drivers of Corporate investment banker Strain: Low Risk  (01/11/2024)   Overall Financial Resource Strain (CARDIA)    Difficulty of Paying Living Expenses: Not hard at all  Food Insecurity: No Food Insecurity (01/11/2024)   Hunger Vital Sign    Worried About Running Out of Food in the Last Year: Never true    Ran Out of Food in the Last Year: Never true  Transportation Needs: No Transportation Needs (01/11/2024)   PRAPARE - Administrator, Civil Service (Medical): No    Lack of Transportation (Non-Medical): No  Physical Activity: Insufficiently Active (01/11/2024)   Exercise Vital Sign    Days of Exercise per Week: 3 days    Minutes of Exercise per Session: 30 min  Stress: No Stress Concern Present (01/11/2024)   [employer] of Occupational Health - Occupational Stress Questionnaire    Feeling of Stress : Not at all  Social Connections: Moderately Integrated (01/11/2024)   Social Connection and Isolation Panel [NHANES]    Frequency of Communication with Friends and Family: More than three times a week    Frequency of Social Gatherings with Friends and Family: Three times a week    Attends Religious Services: More than 4 times per year    Active Member of Clubs or Organizations: Yes    Attends Banker Meetings: More than 4 times per year    Marital Status: Widowed    Tobacco Counseling Counseling given: Not Answered    Clinical Intake:  Pre-visit preparation completed: Yes  Pain : No/denies pain     Diabetes: Yes CBG done?: No Did pt. bring in CBG monitor from home?: No  Lab Results  Component Value Date   HGBA1C 6.0 (H) 01/06/2024   HGBA1C 5.5 10/07/2023   HGBA1C 6.3 (H) 05/14/2023     How often do you need to have someone help you when you read instructions, pamphlets, or other written materials from your doctor or pharmacy?: 1 - Never  Interpreter Needed?:  No  Information entered by :: Kandis Fantasia LPN   Activities of Daily Living     01/11/2024    2:18 PM  In your present state of health, do you have any difficulty performing the following activities:  Hearing? 0  Vision? 0  Difficulty concentrating or making decisions? 1  Walking or climbing stairs? 0  Dressing or bathing? 0  Doing errands, shopping? 0  Preparing Food and eating ? N  Using the Toilet? N  In the past six months, have you accidently leaked urine? N  Do you have problems with loss of bowel control? N  Managing your Medications? N  Managing your Finances? N  Housekeeping or managing your  Housekeeping? N    Patient Care Team: Sonny Masters, FNP as PCP - General (Family Medicine) Babs Bertin, OD (Optometry) Dani Gobble, NP as Nurse Practitioner (Nurse Practitioner) Adam Phenix, DPM as Consulting Physician (Podiatry) Elwyn Reach (Neurology) Kaylyn Lim, OD (Optometry)  Indicate any recent Medical Services you may have received from other than [facility] providers in the past year (date may be approximate).     Assessment:   This is a routine wellness examination for Jood.  Hearing/Vision screen Hearing Screening - Comments:: Denies hearing difficulties   Vision Screening - Comments:: Wears rx glasses - up to date with routine eye exams with Dr. Si Gaul   Goals Addressed   None    Depression Screen     01/11/2024    2:17 PM 01/06/2024   11:40 AM 10/07/2023   10:27 AM 06/10/2023   10:25 AM 03/10/2023   10:24 AM 01/06/2023   11:25 AM 12/02/2022    9:43 AM  PHQ 2/9 Scores  PHQ - 2 Score 0 0 0 0 0 0 0  PHQ- 9 Score 0 0 0 0 0  0    Fall Risk     01/11/2024    2:18 PM 01/06/2024   11:40 AM 11/03/2023   11:04 AM 10/07/2023   10:27 AM 07/29/2023   11:07 AM  Fall Risk   Falls in the past year? 0 0 0 0 0  Number falls in past yr: 0  0  0  Injury with Fall? 0  0  0  Risk for fall due to : No Fall Risks No Fall Risks      Follow up Falls prevention discussed;Education provided;Falls evaluation completed Falls evaluation completed Falls evaluation completed  Falls evaluation completed    MEDICARE RISK AT HOME:  Medicare Risk at Home Any stairs in or around the home?: No If so, are there any without handrails?: No Home free of loose throw rugs in walkways, pet beds, electrical cords, etc?: Yes Adequate lighting in your home to reduce risk of falls?: Yes Life alert?: No Use of a cane, walker or w/c?: No Grab bars in the bathroom?: Yes Shower chair or bench in shower?: No Elevated toilet seat or a handicapped toilet?: Yes  TIMED UP AND GO:  Was the test performed?  No  Cognitive Function: 6CIT completed    11/03/2023   12:00 PM  MMSE - Mini Mental State Exam  Orientation to time 5  Orientation to Place 5  Registration 3  Attention/ Calculation 5  Recall 2  Language- name 2 objects 2  Language- repeat 1  Language- follow 3 step command 3  Language- read & follow direction 1  Write a sentence 1  Copy design 0  Total score 28      05/14/2023    5:00 PM  Montreal Cognitive Assessment   Visuospatial/ Executive (0/5) 2  Naming (0/3) 2  Attention: Read list of digits (0/2) 2  Attention: Read list of letters (0/1) 0  Attention: Serial 7 subtraction starting at 100 (0/3) 0  Language: Repeat phrase (0/2) 1  Language : Fluency (0/1) 0  Abstraction (0/2) 2  Delayed Recall (0/5) 2  Orientation (0/6) 6  Total 17  Adjusted Score (based on education) 18      01/11/2024    2:18 PM 01/06/2023   11:50 AM 12/31/2021   11:35 AM  6CIT Screen  What Year? 0 points 0 points 0 points  What month? 0  points 0 points 0 points  What time? 0 points 0 points 0 points  Count back from 20 0 points 0 points 0 points  Months in reverse 2 points 0 points 0 points  Repeat phrase 2 points 0 points 0 points  Total Score 4 points 0 points 0 points    Immunizations  There is no immunization history on file for  this patient.  Screening Tests Health Maintenance  Topic Date Due   INFLUENZA VACCINE  01/17/2024 (Originally 05/20/2023)   COVID-19 Vaccine (1) 01/22/2024 (Originally 01/08/1951)   Zoster Vaccines- Shingrix (1 of 2) 04/07/2024 (Originally 01/09/1965)   Pneumonia Vaccine 87+ Years old (1 of 2 - PCV) 01/05/2025 (Originally 01/08/1952)   Hepatitis C Screening  01/05/2025 (Originally 01/08/1964)   OPHTHALMOLOGY EXAM  02/22/2024   MAMMOGRAM  03/28/2024   DEXA SCAN  07/06/2024   HEMOGLOBIN A1C  07/08/2024   Diabetic kidney evaluation - eGFR measurement  10/06/2024   Diabetic kidney evaluation - Urine ACR  10/06/2024   FOOT EXAM  10/06/2024   Medicare Annual Wellness (AWV)  01/10/2025   HPV VACCINES  Aged Out   DTaP/Tdap/Td  Discontinued   Colonoscopy  Discontinued    Health Maintenance  There are no preventive care reminders to display for this patient.  Additional Screening:  Vision Screening: Recommended annual ophthalmology exams for early detection of glaucoma and other disorders of the eye.  Dental Screening: Recommended annual dental exams for proper oral hygiene  Community Resource Referral / Chronic Care Management: CRR required this visit?  No   CCM required this visit?  No     Plan:     I have personally reviewed and noted the following in the patient's chart:   Medical and social history Use of alcohol, tobacco or illicit drugs  Current medications and supplements including opioid prescriptions. Patient is not currently taking opioid prescriptions. Functional ability and status Nutritional status Physical activity Advanced directives List of other physicians Hospitalizations, surgeries, and ER visits in previous 12 months Vitals Screenings to include cognitive, depression, and falls Referrals and appointments  In addition, I have reviewed and discussed with patient certain preventive protocols, quality metrics, and best practice recommendations. A written  personalized care plan for preventive services as well as general preventive health recommendations were provided to patient.     Kandis Fantasia [place], [place]   06/16/5620   After Visit Summary: (Declined) Due to this being a telephonic visit, with patients personalized plan was offered to patient but patient Declined AVS at this time   Notes: Nothing significant to report at this time.

## 2024-01-11 NOTE — Patient Instructions (Signed)
 Ms. Reising , Thank you for taking time to come for your Medicare Wellness Visit. I appreciate your ongoing commitment to your health goals. Please review the following plan we discussed and let me know if I can assist you in the future.   Referrals/Orders/Follow-Ups/Clinician Recommendations: Aim for 30 minutes of exercise or brisk walking, 6-8 glasses of water, and 5 servings of fruits and vegetables each day.  This is a list of the screening recommended for you and due dates:  Health Maintenance  Topic Date Due   Flu Shot  01/17/2024*   COVID-19 Vaccine (1) 01/22/2024*   Zoster (Shingles) Vaccine (1 of 2) 04/07/2024*   Pneumonia Vaccine (1 of 2 - PCV) 01/05/2025*   Hepatitis C Screening  01/05/2025*   Eye exam for diabetics  02/22/2024   Mammogram  03/28/2024   DEXA scan (bone density measurement)  07/06/2024   Hemoglobin A1C  07/08/2024   Yearly kidney function blood test for diabetes  10/06/2024   Yearly kidney health urinalysis for diabetes  10/06/2024   Complete foot exam   10/06/2024   Medicare Annual Wellness Visit  01/10/2025   HPV Vaccine  Aged Out   DTaP/Tdap/Td vaccine  Discontinued   Colon Cancer Screening  Discontinued  *Topic was postponed. The date shown is not the original due date.    Advanced directives: (ACP Link)Information on Advanced Care Planning can be found at [facility] Advance Health Care Directives Advance Health Care Directives. [email or web address]   Next Medicare Annual Wellness Visit scheduled for next year: Yes

## 2024-01-12 ENCOUNTER — Other Ambulatory Visit: Payer: Self-pay | Admitting: Family Medicine

## 2024-01-12 DIAGNOSIS — E1159 Type 2 diabetes mellitus with other circulatory complications: Secondary | ICD-10-CM

## 2024-01-12 NOTE — Telephone Encounter (Signed)
 Copied from CRM [id number]. Topic: Clinical - Medication Refill >> Jan 12, 2024  3:45 PM Antwanette L wrote: Most Recent Primary Care Visit:   Medication: lisinopril-hydrochlorothiazide (ZESTORETIC) 20-12.5 MG tablet  Has the patient contacted their pharmacy? No   Is this the correct pharmacy for this prescription? Yes This is the patient's preferred pharmacy:  [facility] - [place], [place] - [address] [place] [address] [place] [address] Phone: [phone] Fax: [phone]     Has the prescription been filled recently? No  Is the patient out of the medication? No. Have a week worth of medicine  Has the patient been seen for an appointment in the last year OR does the patient have an upcoming appointment? Yes  Can we respond through MyChart? No. Contact patient by phone [phone]  Agent: Please be advised that Rx refills may take up to 3 business days. We ask that you follow-up with your pharmacy.

## 2024-01-13 MED ORDER — LISINOPRIL-HYDROCHLOROTHIAZIDE 20-12.5 MG PO TABS: 1.0000 | ORAL_TABLET | Freq: Every day | ORAL | 0 refills | Status: DC

## 2024-01-19 ENCOUNTER — Ambulatory Visit: Payer: Self-pay | Admitting: Psychology

## 2024-01-19 ENCOUNTER — Ambulatory Visit (INDEPENDENT_AMBULATORY_CARE_PROVIDER_SITE_OTHER): Payer: Medicare Other | Admitting: Psychology

## 2024-01-19 DIAGNOSIS — G3184 Mild cognitive impairment, so stated: Secondary | ICD-10-CM | POA: Diagnosis not present

## 2024-01-19 DIAGNOSIS — R4189 Other symptoms and signs involving cognitive functions and awareness: Secondary | ICD-10-CM

## 2024-01-19 NOTE — Progress Notes (Signed)
   Psychometrician Note   Cognitive testing was administered to [employer] by Wallace Keller, B.S. (psychometrist) under the supervision of Dr. Annice Pih, Psy.D., licensed psychologist on 01/19/2024. Ms. Vanzee did not appear overtly distressed by the testing session per behavioral observation or responses across self-report questionnaires. Rest breaks were offered.    The battery of tests administered was selected by Dr. Annice Pih, Psy.D. with consideration to Ms. Zuniga's current level of functioning, the nature of her symptoms, emotional and behavioral responses during interview, level of literacy, observed level of motivation/effort, and the nature of the referral question. This battery was communicated to the psychometrist. Communication between Dr. Annice Pih, Psy.D. and the psychometrist was ongoing throughout the evaluation and Dr. Annice Pih, Psy.D. was immediately accessible at all times. Dr. Annice Pih, Psy.D. provided supervision to the psychometrist on the date of this service to the extent necessary to assure the quality of all services provided.    Larya Charpentier will return within approximately 1-2 weeks for an interactive feedback session with Dr. Robbie Lis at which time her test performances, clinical impressions, and treatment recommendations will be reviewed in detail. Ms. Viramontes understands she can contact our office should she require our assistance before this time.  A total of 120 minutes of billable time were spent face-to-face with Ms. Peace by the psychometrist. This includes both test administration and scoring time. Billing for these services is reflected in the clinical report generated by Dr. Annice Pih, Psy.D.  This note reflects time spent with the psychometrician and does not include test scores or any clinical interpretations made by Dr. Robbie Lis. The full report will follow in a separate note.

## 2024-01-19 NOTE — Progress Notes (Unsigned)
 NEUROPSYCHOLOGICAL EVALUATION [place]  [place] Department of Neurology  Date of Evaluation: 01/19/2024  REASON FOR REFERRAL   Andrea Shannon is a*** ***-year-old, {KDEISSHANDEDNESS:32240}-handed, *** female with *** years of formal education. She was referred for neuropsychological evaluation by her neurologist, {KDEISSPROVIDERS1:32312}, to assess current neurocognitive functioning, document potential cognitive deficits, and assist with treatment planning. This is her first neuropsychological evaluation.  She previously underwent neuropsychological evaluation on *** at ***. {KDEISSPREVIOUSNP:32242}  SUMMARY OF RESULTS   ***  DIAGNOSTIC IMPRESSION   Results of the current evaluation ***  ICD-10 Codes: ***  RECOMMENDATIONS   A repeat neuropsychological evaluation in *** months (or sooner if functional decline is noted) is recommended.  ***In consultation with your doctor, schedule cognitive reevaluation on an as-needed basis to assess for cognitive decline and update treatment recommendations. Reevaluation should occur during a period of medical and affective stability.  ***Discuss with your neurologist the risks and benefits of starting a medication that can help slow memory decline.  ***Patient has already been prescribed a medication (i.e., ***) aimed at addressing memory loss and concerns surrounding Alzheimer's disease. She is encouraged to continue taking this medication as prescribed. It is important to highlight that this medication has been shown to slow functional decline in some individuals.  ***Consider additional brain imaging. FDG-PET scan may be helpful to characterize regional hypometabolism suggestive of underlying  pathology.  {KDEISSDRIVINGRECS:32201}  {KDEISSDEMENTIARECS:32187}  {KDEISSHEALTHYLIVINGRECS:32196}  {KDEISSPSYCHRECS:32202}  {KDEISSREFERRALRECS:32193}  {KDEISSSLEEPRECS:32198}  {KDEISSSUBSTANCERECS:32199}  {KDEISSSENSORYRECS:32189}  {KDEISSACPRECS (Optional):32200}  {KDEISSCOGRECS:32216}  {KDEISSCAREGIVINGRECS:32194}  {KdeissRecommendations:32183}  DISPOSITION   Patient will follow up with the referring provider, {KDEISSNEUROLOGISTS:32243}. {KDEISSFOLLOWUP:32247}. She and *** will be provided verbal feedback in approximately one week regarding the findings and impression during this visit.  The remainder of the report includes the details of the patient's background and a table of results from the current evaluation, which support the summary and recommendations described above.  BACKGROUND   History of Presenting Illness: The following information was obtained from a review of medical records and an interview with the patient and ***. ***  Previous Neuropsychological Evaluation(s): ***  Cognitive Functioning: During today's appointment, the patient reported ***.  Physical Functioning: Patient denied difficulties with sleep initiation and maintenance. Appetite is stable. No changes to sense of taste or smell were reported. Vision and hearing are stable. ***RBD, falls, tremor, speech, incontinence, pain***  Emotional Functioning: Patient reported her current mood as "***." ***  Imaging: MRI of the brain (***) documented ***.  Other Relevant Medical History: Remarkable for ***. No history of stroke, CNS infection, head injury, or seizure was reported.***  Current Medications: Per patient, ***.  The patient denied taking medications.*** At this time, she reports only taking vitamins and supplements.***  Functional Status: Patient independently performs all ADLs and IADLs, including driving, without difficulty.***  Family Neurological History: Remarkable for  ***  Psychiatric History: Remarkable for ***.  History of depression, anxiety, prior mental health treatment, suicidal ideation, hallucinations, and psychiatric hospitalizations was not reported.  Substance Use History: {KDEISSSUBSTANCES:32250}  Social and Developmental History: Patient was born in ***. History of perinatal complications and developmental delays was not reported. Language ***. Patient is *** and has *** children who live locally. Patient resides in ***.  Educational and Occupational History: No history of childhood learning disability, special education services, or grade retention was reported. Patient completed *** or obtained a *** degree. Patient was employed as ***. Patient retired in [employer]   BEHAVIORAL OBSERVATIONS   Patient arrived  early and was unaccompanied. She ambulated independently and without gait disturbance. She was alert and fully oriented. She was appropriately groomed and dressed for the setting. No significant sensory or motor abnormalities were observed. Vision and hearing were adequate for testing purposes. Speech was of normal rate, prosody, and volume. No conversational word-finding difficulties, paraphasic errors, or dysarthria were observed. Comprehension was conversationally intact. Thought processes were linear, logical, and coherent. Thought content was organized and devoid of delusions. Insight appeared appropriate. Affect was even and congruent with euthymic mood. She was cooperative and gave adequate effort during testing, including on standalone and embedded measures of performance validity***. Results are thought to accurately reflect her cognitive functioning at this time.  NEUROPSYCHOLOGICAL TESTING RESULTS   Tests Administered: {KDEISSNPTESTS:32266}  Test results are provided in the table below. Whenever possible, the patient's scores were compared against age-, sex-, and education-corrected normative samples. Interpretive descriptions are based  on the AACN consensus conference statement on uniform labeling (Guilmette et al., 2020).  ***  INFORMED CONSENT   Patient was provided with a verbal description of the nature and purpose of the neuropsychological evaluation. Also reviewed were the foreseeable risks and/or discomforts and benefits of the procedure, limits of confidentiality, and mandatory reporting requirements of this provider. Patient was given the opportunity to have their questions answered. Oral consent to participate was provided by the patient.   This report was prepared as part of a clinical evaluation and is not intended for forensic use.  SERVICE   This evaluation was conducted by Annice Pih, Psy.D. In addition to time spent directly with the patient, total professional time includes record review, integration of relevant medical history, test selection, interpretation of findings, and report preparation. A technician, Wallace Keller, B.S., provided testing and scoring assistance for 120 minutes.  Psychiatric Diagnostic Evaluation Services (Professional): 16109 x 1 Neuropsychological Testing Evaluation Services (Professional): 60454 x 1 Neuropsychological Testing Evaluation Services (Professional): 09811 x *** Neuropsychological Test Administration and Scoring (Technician): [phone] x 1 Neuropsychological Test Administration and Scoring (Technician): [phone] x 3  This report was generated using voice recognition software. While this document has been carefully reviewed, transcription errors may be present. I apologize in advance for any inconvenience. Please contact me if further clarification is needed.            Annice Pih, Psy.D.             Neuropsychologist

## 2024-01-26 ENCOUNTER — Ambulatory Visit (INDEPENDENT_AMBULATORY_CARE_PROVIDER_SITE_OTHER): Payer: Medicare Other | Admitting: Psychology

## 2024-01-26 DIAGNOSIS — G3184 Mild cognitive impairment, so stated: Secondary | ICD-10-CM

## 2024-01-26 NOTE — Progress Notes (Signed)
   NEUROPSYCHOLOGY FEEDBACK SESSION [place]  [place] Department of Neurology  Date of Feedback Session: 01/26/2024  REASON FOR REFERRAL   Andrea Shannon is a 78 year old, right-handed, Black female with 11 years of formal education. She was referred for neuropsychological evaluation by Marlowe Kays, PA-C, to assess current neurocognitive functioning, document potential cognitive deficits, and assist with treatment planning.  FEEDBACK   Patient completed a comprehensive neuropsychological evaluation on 01/19/2024. Please refer to that encounter for the full report and recommendations. Briefly, results indicated primary deficits in executive functioning and memory, particularly in encoding and retrieval, with relatively preserved storage. These deficits are present in the context of intact functional independence, supporting a diagnosis of mild cognitive impairment. Considering pertinent background information, the pattern of findings does not necessarily implicate a clear etiology at this time. It is possible that the deficits identified today reflect the early changes of an evolving process, highlighting the importance of ongoing monitoring. Currently, the deficit profile is not characteristic of classic Alzheimer's disease, though this etiology cannot be ruled out unequivocally. While hearing loss, variable sleep, and vascular risk factors (in the absence of related neuroimaging findings) can contribute to cognitive difficulties, these factors alone are unlikely to fully account for the pattern of deficits observed today.   Today, the patient was unaccompanied. She was provided verbal feedback regarding the findings and impression during this visit, and her questions were answered. A copy of the report was provided at the conclusion of the visit.  DISPOSITION   Patient will follow up with the referring provider, Ms. Wertman. She should return for repeat neuropsychological  testing in 12-18 months to monitor her course and assist with diagnosis and treatment planning.  SERVICE   This feedback session was conducted by Annice Pih, Psy.D. One unit of 16109 was billed for Dr. Robbie Lis' time spent in preparing, conducting, and documenting the current feedback session.  This report was generated using voice recognition software. While this document has been carefully reviewed, transcription errors may be present. I apologize in advance for any inconvenience. Please contact me if further clarification is needed.

## 2024-02-08 ENCOUNTER — Other Ambulatory Visit: Payer: Self-pay | Admitting: Family Medicine

## 2024-02-08 DIAGNOSIS — E118 Type 2 diabetes mellitus with unspecified complications: Secondary | ICD-10-CM

## 2024-02-08 MED ORDER — METFORMIN HCL 500 MG PO TABS: 500.0000 mg | ORAL_TABLET | Freq: Two times a day (BID) | ORAL | 0 refills | Status: DC

## 2024-02-08 NOTE — Telephone Encounter (Signed)
 Copied from CRM [id number]. Topic: Clinical - Medication Refill >> Feb 08, 2024  8:13 AM Lizabeth Riggs wrote: Most Recent Primary Care Visit:   Medication: metFORMIN  (GLUCOPHAGE ) 500 MG tablet  Has the patient contacted their pharmacy? Yes (Agent: If no, request that the patient contact the pharmacy for the refill. If patient does not wish to contact the pharmacy document the reason why and proceed with request.) (Agent: If yes, when and what did the pharmacy advise?) Pharmacy needs an order to refill  Is this the correct pharmacy for this prescription? Yes If no, delete pharmacy and type the correct one.  This is the patient's preferred pharmacy:  [facility] - [place], [place] - [address] [facility] [address] [place] [address] [place] [address] Phone: [phone] Fax: [phone]  Has the prescription been filled recently? No  Is the patient out of the medication? No She has 26 days left of medication.   Has the patient been seen for an appointment in the last year OR does the patient have an upcoming appointment? Yes  Can we respond through MyChart? No  Agent: Please be advised that Rx refills may take up to 3 business days. We ask that you follow-up with your pharmacy.

## 2024-02-11 ENCOUNTER — Encounter: Payer: Self-pay | Admitting: Physician Assistant

## 2024-02-21 ENCOUNTER — Other Ambulatory Visit: Payer: Self-pay | Admitting: Family Medicine

## 2024-02-21 DIAGNOSIS — Z1231 Encounter for screening mammogram for malignant neoplasm of breast: Secondary | ICD-10-CM

## 2024-02-22 DIAGNOSIS — H401131 Primary open-angle glaucoma, bilateral, mild stage: Secondary | ICD-10-CM | POA: Diagnosis not present

## 2024-02-22 DIAGNOSIS — H2513 Age-related nuclear cataract, bilateral: Secondary | ICD-10-CM | POA: Diagnosis not present

## 2024-02-29 ENCOUNTER — Other Ambulatory Visit: Payer: Self-pay | Admitting: Family Medicine

## 2024-03-07 ENCOUNTER — Telehealth: Payer: Self-pay | Admitting: Family Medicine

## 2024-03-09 ENCOUNTER — Other Ambulatory Visit: Payer: Self-pay | Admitting: Family Medicine

## 2024-03-09 DIAGNOSIS — G8929 Other chronic pain: Secondary | ICD-10-CM

## 2024-03-09 MED ORDER — DICLOFENAC SODIUM 75 MG PO TBEC: 75.0000 mg | DELAYED_RELEASE_TABLET | Freq: Two times a day (BID) | ORAL | 0 refills | Status: DC

## 2024-03-09 NOTE — Telephone Encounter (Signed)
 Copied from CRM [id number]. Topic: Clinical - Medication Refill >> Mar 09, 2024 11:16 AM Emylou G wrote: Medication: diclofenac  (VOLTAREN ) 75 MG EC tablet  Has the patient contacted their pharmacy? No (Agent: If no, request that the patient contact the pharmacy for the refill. If patient does not wish to contact the pharmacy document the reason why and proceed with request.) (Agent: If yes, when and what did the pharmacy advise?)  This is the patient's preferred pharmacy:  [facility] - [place], [place] - [address] [facility] [address] [place] [address] [place] [address] Phone: [phone] Fax: [phone]   Is this the correct pharmacy for this prescription? Yes If no, delete pharmacy and type the correct one.   Has the prescription been filled recently? No  Is the patient out of the medication? Yes almost out  Has the patient been seen for an appointment in the last year OR does the patient have an upcoming appointment? Yes  Can we respond through MyChart? No  Agent: Please be advised that Rx refills may take up to 3 business days. We ask that you follow-up with your pharmacy.

## 2024-03-09 NOTE — Telephone Encounter (Signed)
 Last Fill: 12/29/22  Last OV: 01/11/24 Next OV: 04/18/24  Routing to provider for review/authorization.

## 2024-03-21 ENCOUNTER — Ambulatory Visit (INDEPENDENT_AMBULATORY_CARE_PROVIDER_SITE_OTHER)

## 2024-03-21 DIAGNOSIS — E118 Type 2 diabetes mellitus with unspecified complications: Secondary | ICD-10-CM

## 2024-03-21 DIAGNOSIS — E139 Other specified diabetes mellitus without complications: Secondary | ICD-10-CM

## 2024-03-21 LAB — HM DIABETES EYE EXAM

## 2024-03-21 NOTE — Progress Notes (Signed)
 Andrea Shannon arrived 03/21/2024 and has given verbal consent to obtain images and complete their overdue diabetic retinal screening.  The images have been sent to an ophthalmologist or optometrist for review and interpretation.  Results will be sent back to Galvin Jules, FNP for review.  Patient has been informed they will be contacted when we receive the results via telephone or MyChart

## 2024-03-27 ENCOUNTER — Other Ambulatory Visit: Payer: Self-pay | Admitting: *Deleted

## 2024-03-27 DIAGNOSIS — E0520 Thyrotoxicosis with toxic multinodular goiter without thyrotoxic crisis or storm: Secondary | ICD-10-CM

## 2024-03-27 MED ORDER — LEVOTHYROXINE SODIUM 125 MCG PO TABS: ORAL_TABLET | ORAL | 2 refills | Status: DC

## 2024-03-27 NOTE — Telephone Encounter (Signed)
 Patient called requesting that a prescription be sent in for her Thyroid  medication. This has been done and the patient is aware.

## 2024-03-31 ENCOUNTER — Other Ambulatory Visit: Payer: Self-pay | Admitting: Family Medicine

## 2024-03-31 DIAGNOSIS — K219 Gastro-esophageal reflux disease without esophagitis: Secondary | ICD-10-CM

## 2024-03-31 MED ORDER — SUCRALFATE 1 G PO TABS: 1.0000 g | ORAL_TABLET | Freq: Two times a day (BID) | ORAL | 0 refills | Status: DC

## 2024-03-31 NOTE — Telephone Encounter (Signed)
 Copied from CRM [id number]. Topic: Clinical - Medication Refill >> Mar 31, 2024  8:13 AM Lynnie Saucier S wrote: Medication: sucralfate  (CARAFATE ) 1 g tablet   Has the patient contacted their pharmacy? Yes (Agent: If no, request that the patient contact the pharmacy for the refill. If patient does not wish to contact the pharmacy document the reason why and proceed with request.) (Agent: If yes, when and what did the pharmacy advise?)  This is the patient's preferred pharmacy:  [facility] - [place], [place] - [address] [facility] [address] [place] [address] [place] [address] Phone: [phone] Fax: [phone]    Is this the correct pharmacy for this prescription? Yes If no, delete pharmacy and type the correct one.   Has the prescription been filled recently? No  Is the patient out of the medication? No  Has the patient been seen for an appointment in the last year OR does the patient have an upcoming appointment? Yes  Can we respond through MyChart? No  Agent: Please be advised that Rx refills may take up to 3 business days. We ask that you follow-up with your pharmacy.

## 2024-04-11 ENCOUNTER — Other Ambulatory Visit: Payer: Self-pay | Admitting: *Deleted

## 2024-04-11 DIAGNOSIS — E0520 Thyrotoxicosis with toxic multinodular goiter without thyrotoxic crisis or storm: Secondary | ICD-10-CM

## 2024-04-11 MED ORDER — LEVOTHYROXINE SODIUM 125 MCG PO TABS: ORAL_TABLET | ORAL | 2 refills | Status: DC

## 2024-04-12 ENCOUNTER — Ambulatory Visit
Admission: RE | Admit: 2024-04-12 | Discharge: 2024-04-12 | Disposition: A | Discharge status: Home / Self Care | Source: Ambulatory Visit | Attending: Family Medicine | Admitting: Family Medicine

## 2024-04-12 DIAGNOSIS — Z1231 Encounter for screening mammogram for malignant neoplasm of breast: Secondary | ICD-10-CM

## 2024-04-16 ENCOUNTER — Ambulatory Visit: Payer: Self-pay | Admitting: Family Medicine

## 2024-04-18 ENCOUNTER — Encounter: Payer: Self-pay | Admitting: Family Medicine

## 2024-04-18 ENCOUNTER — Ambulatory Visit (INDEPENDENT_AMBULATORY_CARE_PROVIDER_SITE_OTHER): Admitting: Family Medicine

## 2024-04-18 ENCOUNTER — Telehealth: Payer: Self-pay | Admitting: Family Medicine

## 2024-04-18 VITALS — BP 135/68 | HR 82 | Temp 96.8°F | Ht 63.0 in | Wt 145.6 lb

## 2024-04-18 DIAGNOSIS — E118 Type 2 diabetes mellitus with unspecified complications: Secondary | ICD-10-CM | POA: Diagnosis not present

## 2024-04-18 DIAGNOSIS — I152 Hypertension secondary to endocrine disorders: Secondary | ICD-10-CM

## 2024-04-18 DIAGNOSIS — E039 Hypothyroidism, unspecified: Secondary | ICD-10-CM

## 2024-04-18 DIAGNOSIS — E1159 Type 2 diabetes mellitus with other circulatory complications: Secondary | ICD-10-CM

## 2024-04-18 DIAGNOSIS — K219 Gastro-esophageal reflux disease without esophagitis: Secondary | ICD-10-CM

## 2024-04-18 DIAGNOSIS — M5416 Radiculopathy, lumbar region: Secondary | ICD-10-CM | POA: Diagnosis not present

## 2024-04-18 DIAGNOSIS — D229 Melanocytic nevi, unspecified: Secondary | ICD-10-CM

## 2024-04-18 LAB — LIPID PANEL

## 2024-04-18 LAB — BAYER DCA HB A1C WAIVED: HB A1C (BAYER DCA - WAIVED): 5.8 % — ABNORMAL HIGH (ref 4.8–5.6)

## 2024-04-18 MED ORDER — METFORMIN HCL 500 MG PO TABS: 500.0000 mg | ORAL_TABLET | Freq: Two times a day (BID) | ORAL | 1 refills | Status: DC

## 2024-04-18 MED ORDER — LISINOPRIL-HYDROCHLOROTHIAZIDE 20-12.5 MG PO TABS: 1.0000 | ORAL_TABLET | Freq: Every day | ORAL | 1 refills | Status: DC

## 2024-04-18 MED ORDER — PANTOPRAZOLE SODIUM 40 MG PO TBEC: 40.0000 mg | DELAYED_RELEASE_TABLET | Freq: Every day | ORAL | 1 refills | Status: DC

## 2024-04-18 NOTE — Telephone Encounter (Signed)
 Appointment scheduled.

## 2024-04-18 NOTE — Patient Instructions (Addendum)

## 2024-04-18 NOTE — Telephone Encounter (Signed)
 Patient needs appt for Return in about 3 months (around 07/19/2024) for DM, Annual Physical and can't get her in until 10-13-24. Please call patient with appt.

## 2024-04-18 NOTE — Progress Notes (Signed)
 Subjective:  Patient ID: Andrea Shannon, female    DOB: [birth year], 78 y.o.   MRN: [id number]  Patient Care Team: Severa Rock HERO, FNP as PCP - General (Family Medicine) Boone Ronal Comer SHAUNNA, OD (Optometry) Therisa Benton PARAS, NP as Nurse Practitioner (Nurse Practitioner) Roddie Bring, DPM as Consulting Physician (Podiatry) Dina, Sara E, PA-C (Neurology) Allen Poe, OD (Optometry)   Chief Complaint:  Diabetes (3 month follow up )   HPI: Andrea Shannon is a 78 y.o. female presenting on 04/18/2024 for Diabetes (3 month follow up )   Andrea Shannon is a 78 year old female with diabetes and hypertension who presents for a follow-up visit.  Fall event - Recent fall at an unfamiliar church - No injuries sustained - No ongoing problems since the fall - First fall in a long time  Glycemic control - Diabetes currently well-controlled with A1c of 5.8% - Not checking blood sugars regularly - No increased hunger, thirst, or urination - Attributes increased urination to increased water intake due to hot weather - Requires refill of metformin  for 90-day supply  Gastroesophageal reflux symptoms - Symptoms controlled with pantoprazole  - Occasional symptoms requiring consideration of increased sucralfate  dosing  Ophthalmologic symptoms - Recent eye examination for diabetic retinopathy; results pending - Scheduled for cataract evaluation on July 10th - Cataracts present in both eyes, one worse than the other  Vasomotor symptoms - Night sweats present - Taking black cohosh for approximately 1.5 months without improvement - Plans to continue black cohosh for at least three months  Cutaneous lesion - Mole on scalp not bothersome except for irritation when combing hair - Considering removal but undecided  Lumbar radiculopathy - Planning to receive epidural injection for pinched nerves in back - Appointment with Dr. Debby scheduled for July 28th for further evaluation           Relevant past medical, surgical, family, and social history reviewed and updated as indicated.  Allergies and medications reviewed and updated. Data reviewed: Chart in Epic.   Past Medical History:  Diagnosis Date   Acid reflux    Arthritis    Diabetes mellitus, type II (HCC)    Hypertension    Hyperthyroidism    Hypothyroidism     Past Surgical History:  Procedure Laterality Date   ABDOMINAL HYSTERECTOMY      Social History   Socioeconomic History   Marital status: Widowed    Spouse name: Not on file   Number of children: 1   Years of education: 12   Highest education level: High school graduate  Occupational History   Not on file  Tobacco Use   Smoking status: Never   Smokeless tobacco: Never  Vaping Use   Vaping status: Never Used  Substance and Sexual Activity   Alcohol  use: Not Currently   Drug use: Never   Sexual activity: Not Currently    Birth control/protection: Surgical  Other Topics Concern   Not on file  Social History Narrative   Right handed   Drinks caffeine coffee   Lives alone   Retired   One son   Social Drivers of Corporate investment banker Strain: Low Risk  (01/11/2024)   Overall Financial Resource Strain (CARDIA)    Difficulty of Paying Living Expenses: Not hard at all  Food Insecurity: No Food Insecurity (01/11/2024)   Hunger Vital Sign    Worried About Running Out of Food in the Last Year: Never true    Ran Out of  Food in the Last Year: Never true  Transportation Needs: No Transportation Needs (01/11/2024)   PRAPARE - Administrator, Civil Service (Medical): No    Lack of Transportation (Non-Medical): No  Physical Activity: Insufficiently Active (01/11/2024)   Exercise Vital Sign    Days of Exercise per Week: 3 days    Minutes of Exercise per Session: 30 min  Stress: No Stress Concern Present (01/11/2024)   [employer] of Occupational Health - Occupational Stress Questionnaire    Feeling of Stress : Not  at all  Social Connections: Moderately Integrated (01/11/2024)   Social Connection and Isolation Panel    Frequency of Communication with Friends and Family: More than three times a week    Frequency of Social Gatherings with Friends and Family: Three times a week    Attends Religious Services: More than 4 times per year    Active Member of Clubs or Organizations: Yes    Attends Banker Meetings: More than 4 times per year    Marital Status: Widowed  Intimate Partner Violence: Not At Risk (01/11/2024)   Humiliation, Afraid, Rape, and Kick questionnaire    Fear of Current or Ex-Partner: No    Emotionally Abused: No    Physically Abused: No    Sexually Abused: No    Outpatient Encounter Medications as of 04/18/2024  Medication Sig   aspirin 81 MG chewable tablet Chew by mouth daily.   diclofenac  (VOLTAREN ) 75 MG EC tablet Take 1 tablet (75 mg total) by mouth 2 (two) times daily.   levothyroxine  (SYNTHROID ) 125 MCG tablet Take 1/2 Tablet by mouth every morning before breakfast.   meclizine  (ANTIVERT ) 12.5 MG tablet Take 1 tablet (12.5 mg total) by mouth 3 (three) times daily as needed for dizziness.   sucralfate  (CARAFATE ) 1 g tablet Take 1 tablet (1 g total) by mouth 2 (two) times daily.   vitamin B-12 (CYANOCOBALAMIN ) 100 MCG tablet Take 100 mcg by mouth daily.   [DISCONTINUED] lisinopril -hydrochlorothiazide  (ZESTORETIC ) 20-12.5 MG tablet Take 1 tablet by mouth daily.   [DISCONTINUED] metFORMIN  (GLUCOPHAGE ) 500 MG tablet Take 1 tablet (500 mg total) by mouth 2 (two) times daily with a meal.   [DISCONTINUED] pantoprazole  (PROTONIX ) 40 MG tablet Take 1 tablet (40 mg total) by mouth daily.   lisinopril -hydrochlorothiazide  (ZESTORETIC ) 20-12.5 MG tablet Take 1 tablet by mouth daily.   metFORMIN  (GLUCOPHAGE ) 500 MG tablet Take 1 tablet (500 mg total) by mouth 2 (two) times daily with a meal.   pantoprazole  (PROTONIX ) 40 MG tablet Take 1 tablet (40 mg total) by mouth daily.   No  facility-administered encounter medications on file as of 04/18/2024.    No Known Allergies  Pertinent ROS per HPI, otherwise unremarkable      Objective:  BP 135/68   Pulse 82   Temp (!) 96.8 F (36 C)   Ht 5' 3 (1.6 m)   Wt 145 lb 9.6 oz (66 kg)   SpO2 99%   BMI 25.79 kg/m    Wt Readings from Last 3 Encounters:  04/18/24 145 lb 9.6 oz (66 kg)  01/11/24 145 lb (65.8 kg)  01/06/24 145 lb 12.8 oz (66.1 kg)    Physical Exam Vitals and nursing note reviewed.  Constitutional:      General: She is not in acute distress.    Appearance: Normal appearance. She is well-developed and well-groomed. She is not ill-appearing, toxic-appearing or diaphoretic.  HENT:     Head: Normocephalic and atraumatic.  Jaw: There is normal jaw occlusion.     Right Ear: Hearing normal.     Left Ear: Hearing normal.     Nose: Nose normal.     Mouth/Throat:     Lips: Pink.     Mouth: Mucous membranes are moist.     Pharynx: Oropharynx is clear. Uvula midline.   Eyes:     General: Lids are normal.     Extraocular Movements: Extraocular movements intact.     Conjunctiva/sclera: Conjunctivae normal.     Pupils: Pupils are equal, round, and reactive to light.   Neck:     Thyroid : No thyroid  mass, thyromegaly or thyroid  tenderness.     Vascular: No carotid bruit or JVD.     Trachea: Trachea and phonation normal.   Cardiovascular:     Rate and Rhythm: Normal rate and regular rhythm.     Chest Wall: PMI is not displaced.     Pulses: Normal pulses.     Heart sounds: Normal heart sounds. No murmur heard.    No friction rub. No gallop.  Pulmonary:     Effort: Pulmonary effort is normal. No respiratory distress.     Breath sounds: Normal breath sounds. No wheezing.  Abdominal:     General: Bowel sounds are normal. There is no distension or abdominal bruit.     Palpations: Abdomen is soft. There is no hepatomegaly or splenomegaly.     Tenderness: There is no abdominal tenderness. There is no  right CVA tenderness or left CVA tenderness.     Hernia: No hernia is present.   Musculoskeletal:        General: Normal range of motion.     Cervical back: Normal range of motion and neck supple.     Right lower leg: No edema.     Left lower leg: No edema.  Lymphadenopathy:     Cervical: No cervical adenopathy.   Skin:    General: Skin is warm and dry.     Capillary Refill: Capillary refill takes less than 2 seconds.     Coloration: Skin is not cyanotic, jaundiced or pale.     Findings: No rash.       Neurological:     General: No focal deficit present.     Mental Status: She is alert and oriented to person, place, and time.     Sensory: Sensation is intact.     Motor: Motor function is intact.     Coordination: Coordination is intact.     Gait: Gait is intact.     Deep Tendon Reflexes: Reflexes are normal and symmetric.   Psychiatric:        Attention and Perception: Attention and perception normal.        Mood and Affect: Mood and affect normal.        Speech: Speech normal.        Behavior: Behavior normal. Behavior is cooperative.        Thought Content: Thought content normal.        Cognition and Memory: Cognition and memory normal.        Judgment: Judgment normal.      Results for orders placed or performed in visit on 01/06/24  Bayer DCA Hb A1c Waived   Collection Time: 01/06/24 11:45 AM  Result Value Ref Range   HB A1C (BAYER DCA - WAIVED) 6.0 (H) 4.8 - 5.6 %       Pertinent labs & imaging results that were available  during my care of the patient were reviewed by me and considered in my medical decision making.  Assessment & Plan:  Andrea Shannon was seen today for diabetes.  Diagnoses and all orders for this visit:  Diabetes mellitus type 2 with complications (HCC) -     Bayer DCA Hb A1c Waived -     metFORMIN  (GLUCOPHAGE ) 500 MG tablet; Take 1 tablet (500 mg total) by mouth 2 (two) times daily with a meal.  Hypertension associated with diabetes (HCC) -      Bayer DCA Hb A1c Waived -     CBC with Differential/Platelet -     CMP14+EGFR -     Lipid panel -     lisinopril -hydrochlorothiazide  (ZESTORETIC ) 20-12.5 MG tablet; Take 1 tablet by mouth daily.  Gastroesophageal reflux disease without esophagitis -     CBC with Differential/Platelet -     pantoprazole  (PROTONIX ) 40 MG tablet; Take 1 tablet (40 mg total) by mouth daily.  Acquired hypothyroidism -     CMP14+EGFR -     Lipid panel  Fleshy skin mole -     CBC with Differential/Platelet  Lumbar radiculopathy -     Bayer DCA Hb A1c Waived -     CMP14+EGFR       Chronic Back Pain with Sciatica Pinched nerves in the back with plans for an epidural injection for pain relief. Scheduled to see Dr. Debby on July 28 for further evaluation and possible x-rays before scheduling the injection. The injection may include lidocaine  and steroids, with relief potentially lasting up to three months. - Discuss epidural injection with Dr. Debby on July 28 - Consider epidural injection for pain relief after consultation  Diabetes Mellitus Type 2 Not regularly checking blood glucose levels but denies increased hunger, thirst, or urination. Increased urination attributed to increased water intake due to hot weather. A1c is 5.8, indicating good glycemic control. - Refill metformin  for 90 days - Follow-up in 3 months for physical examination  Gastroesophageal Reflux Disease (GERD) Reflux symptoms well-controlled with pantoprazole . Inquires about sucralfate  for reflux management and feels the need to take it more often due to occasional symptoms.  Cataracts Cataracts in both eyes, with one eye worse than the other. Scheduled for evaluation on July 10 for cataract removal.  Mole on Scalp Mole on the scalp is not bothersome but is irritated when combing hair. No changes in size or appearance. Prefers in-office removal if necessary. - Offer referral to dermatology for mole removal - Offer in-office  removal if preferred          Continue all other maintenance medications.  Follow up plan: Return in about 3 months (around 07/19/2024) for DM, Annual Physical.   Continue healthy lifestyle choices, including diet (rich in fruits, vegetables, and lean proteins, and low in salt and simple carbohydrates) and exercise (at least 30 minutes of moderate physical activity daily).  Educational handout given for DM  The above assessment and management plan was discussed with the patient. The patient verbalized understanding of and has agreed to the management plan. Patient is aware to call the clinic if they develop any new symptoms or if symptoms persist or worsen. Patient is aware when to return to the clinic for a follow-up visit. Patient educated on when it is appropriate to go to the emergency department.   Rosaline Bruns, FNP-C [facility] [phone]

## 2024-04-19 ENCOUNTER — Ambulatory Visit: Payer: Self-pay | Admitting: Family Medicine

## 2024-04-19 ENCOUNTER — Ambulatory Visit: Payer: Self-pay

## 2024-04-19 DIAGNOSIS — D75839 Thrombocytosis, unspecified: Secondary | ICD-10-CM

## 2024-04-19 LAB — CBC WITH DIFFERENTIAL/PLATELET
Basophils Absolute: 0 10*3/uL (ref 0.0–0.2)
Basos: 1 %
EOS (ABSOLUTE): 0.1 x10E3/uL (ref 0.0–0.4)
Eos: 3 %
Hematocrit: 35.9 % (ref 34.0–46.6)
Hemoglobin: 10.6 g/dL — ABNORMAL LOW (ref 11.1–15.9)
Immature Grans (Abs): 0 10*3/uL (ref 0.0–0.1)
Immature Granulocytes: 0 %
Lymphocytes Absolute: 1.6 x10E3/uL (ref 0.7–3.1)
Lymphs: 33 %
MCH: 24.4 pg — ABNORMAL LOW (ref 26.6–33.0)
MCHC: 29.5 g/dL — ABNORMAL LOW (ref 31.5–35.7)
MCV: 83 fL (ref 79–97)
Monocytes Absolute: 0.3 10*3/uL (ref 0.1–0.9)
Monocytes: 6 %
Neutrophils Absolute: 2.7 10*3/uL (ref 1.4–7.0)
Neutrophils: 57 %
Platelets: 493 10*3/uL — ABNORMAL HIGH (ref 150–450)
RBC: 4.35 x10E6/uL (ref 3.77–5.28)
RDW: 17.1 % — ABNORMAL HIGH (ref 11.7–15.4)
WBC: 4.7 x10E3/uL (ref 3.4–10.8)

## 2024-04-19 LAB — CMP14+EGFR
ALT: 12 IU/L (ref 0–32)
AST: 12 IU/L (ref 0–40)
Albumin: 4.4 g/dL (ref 3.8–4.8)
Alkaline Phosphatase: 66 IU/L (ref 44–121)
BUN/Creatinine Ratio: 13 (ref 12–28)
BUN: 14 mg/dL (ref 8–27)
Bilirubin Total: <0.2 mg/dL (ref 0.0–1.2)
CO2: 23 mmol/L (ref 20–29)
Calcium: 9.9 mg/dL (ref 8.7–10.3)
Chloride: 102 mmol/L (ref 96–106)
Creatinine, Ser: 1.05 mg/dL — ABNORMAL HIGH (ref 0.57–1.00)
Globulin, Total: 2 g/dL (ref 1.5–4.5)
Glucose: 74 mg/dL (ref 70–99)
Potassium: 4.1 mmol/L (ref 3.5–5.2)
Sodium: 143 mmol/L (ref 134–144)
Total Protein: 6.4 g/dL (ref 6.0–8.5)
eGFR: 55 mL/min/{1.73_m2} — ABNORMAL LOW (ref >59)

## 2024-04-19 LAB — LIPID PANEL
Cholesterol, Total: 170 mg/dL (ref 100–199)
HDL: 53 mg/dL (ref >39)
LDL CALC COMMENT:: 3.2 ratio (ref 0.0–4.4)
LDL Chol Calc (NIH): 78 mg/dL (ref 0–99)
Triglycerides: 236 mg/dL — ABNORMAL HIGH (ref 0–149)
VLDL Cholesterol Cal: 39 mg/dL (ref 5–40)

## 2024-04-19 NOTE — Telephone Encounter (Addendum)
 FYI Only or Action Required?: FYI only for provider.  Patient was last seen in primary care on 04/18/2024 by Severa Rock HERO, FNP. Called Nurse Triage reporting Results. Symptoms began today. Interventions attempted: Nothing. Symptoms are: stable.  Triage Disposition: Information or Advice Only Call  Patient/caregiver understands and will follow disposition?: YesCopied from CRM [id number]. Topic: Clinical - Lab/Test Results >> Apr 19, 2024  4:28 PM Wess RAMAN wrote: Reason for CRM: further questions about recent labs Reason for Disposition  General information question, no triage required and triager able to answer question  Answer Assessment - Initial Assessment Questions 1. REASON FOR CALL or QUESTION: What is your reason for calling today? or How can I best help you? or What question do you have that I can help answer?     Patient called to discuss her lab results.. RN provided message in chart left by provider. Advised patient to follow up if she has not heard anything regarding from hematology department  Protocols used: Information Only Call - No Triage-A-AH

## 2024-04-20 NOTE — Telephone Encounter (Signed)
 Reviewed labs with patient. LS

## 2024-04-25 ENCOUNTER — Inpatient Hospital Stay: Attending: Hematology | Admitting: Hematology

## 2024-04-25 ENCOUNTER — Inpatient Hospital Stay

## 2024-04-25 VITALS — BP 133/76 | HR 94 | Temp 98.9°F | Resp 18 | Ht 63.0 in | Wt 142.0 lb

## 2024-04-25 DIAGNOSIS — E039 Hypothyroidism, unspecified: Secondary | ICD-10-CM | POA: Diagnosis not present

## 2024-04-25 DIAGNOSIS — Z803 Family history of malignant neoplasm of breast: Secondary | ICD-10-CM | POA: Diagnosis not present

## 2024-04-25 DIAGNOSIS — K219 Gastro-esophageal reflux disease without esophagitis: Secondary | ICD-10-CM | POA: Insufficient documentation

## 2024-04-25 DIAGNOSIS — D631 Anemia in chronic kidney disease: Secondary | ICD-10-CM | POA: Diagnosis not present

## 2024-04-25 DIAGNOSIS — Z7982 Long term (current) use of aspirin: Secondary | ICD-10-CM | POA: Diagnosis not present

## 2024-04-25 DIAGNOSIS — D75839 Thrombocytosis, unspecified: Secondary | ICD-10-CM | POA: Diagnosis not present

## 2024-04-25 DIAGNOSIS — Z7984 Long term (current) use of oral hypoglycemic drugs: Secondary | ICD-10-CM | POA: Diagnosis not present

## 2024-04-25 DIAGNOSIS — R718 Other abnormality of red blood cells: Secondary | ICD-10-CM | POA: Insufficient documentation

## 2024-04-25 DIAGNOSIS — M129 Arthropathy, unspecified: Secondary | ICD-10-CM | POA: Insufficient documentation

## 2024-04-25 DIAGNOSIS — I129 Hypertensive chronic kidney disease with stage 1 through stage 4 chronic kidney disease, or unspecified chronic kidney disease: Secondary | ICD-10-CM | POA: Diagnosis not present

## 2024-04-25 DIAGNOSIS — D509 Iron deficiency anemia, unspecified: Secondary | ICD-10-CM | POA: Diagnosis not present

## 2024-04-25 DIAGNOSIS — K5900 Constipation, unspecified: Secondary | ICD-10-CM | POA: Diagnosis not present

## 2024-04-25 DIAGNOSIS — Z791 Long term (current) use of non-steroidal anti-inflammatories (NSAID): Secondary | ICD-10-CM | POA: Diagnosis not present

## 2024-04-25 DIAGNOSIS — R7989 Other specified abnormal findings of blood chemistry: Secondary | ICD-10-CM | POA: Insufficient documentation

## 2024-04-25 DIAGNOSIS — E1122 Type 2 diabetes mellitus with diabetic chronic kidney disease: Secondary | ICD-10-CM | POA: Diagnosis not present

## 2024-04-25 DIAGNOSIS — N189 Chronic kidney disease, unspecified: Secondary | ICD-10-CM | POA: Diagnosis not present

## 2024-04-25 DIAGNOSIS — Z79899 Other long term (current) drug therapy: Secondary | ICD-10-CM | POA: Diagnosis not present

## 2024-04-25 DIAGNOSIS — Z79890 Hormone replacement therapy (postmenopausal): Secondary | ICD-10-CM | POA: Diagnosis not present

## 2024-04-25 LAB — CBC WITH DIFFERENTIAL/PLATELET
Abs Immature Granulocytes: 0.02 K/uL (ref 0.00–0.07)
Basophils Absolute: 0 K/uL (ref 0.0–0.1)
Basophils Relative: 1 %
Eosinophils Absolute: 0.1 K/uL (ref 0.0–0.5)
Eosinophils Relative: 2 %
HCT: 38.1 % (ref 36.0–46.0)
Hemoglobin: 11.5 g/dL — ABNORMAL LOW (ref 12.0–15.0)
Immature Granulocytes: 0 %
Lymphocytes Relative: 28 %
Lymphs Abs: 1.6 K/uL (ref 0.7–4.0)
MCH: 24 pg — ABNORMAL LOW (ref 26.0–34.0)
MCHC: 30.2 g/dL (ref 30.0–36.0)
MCV: 79.5 fL — ABNORMAL LOW (ref 80.0–100.0)
Monocytes Absolute: 0.4 K/uL (ref 0.1–1.0)
Monocytes Relative: 6 %
Neutro Abs: 3.6 K/uL (ref 1.7–7.7)
Neutrophils Relative %: 63 %
Platelets: 289 K/uL (ref 150–400)
RBC: 4.79 MIL/uL (ref 3.87–5.11)
RDW: 15.9 % — ABNORMAL HIGH (ref 11.5–15.5)
WBC: 5.7 K/uL (ref 4.0–10.5)
nRBC: 0 % (ref 0.0–0.2)

## 2024-04-25 LAB — FERRITIN: Ferritin: 30 ng/mL (ref 11–307)

## 2024-04-25 LAB — RETICULOCYTES
Immature Retic Fract: 10 % (ref 2.3–15.9)
RBC.: 4.73 MIL/uL (ref 3.87–5.11)
Retic Count, Absolute: 59.6 K/uL (ref 19.0–186.0)
Retic Ct Pct: 1.3 % (ref 0.4–3.1)

## 2024-04-25 LAB — IRON AND TIBC
Iron: 84 ug/dL (ref 28–170)
Saturation Ratios: 19 % (ref 10.4–31.8)
TIBC: 432 ug/dL (ref 250–450)
UIBC: 348 ug/dL

## 2024-04-25 LAB — LACTATE DEHYDROGENASE: LDH: 165 U/L (ref 98–192)

## 2024-04-25 LAB — FOLATE: Folate: 29.5 ng/mL (ref >5.9)

## 2024-04-25 LAB — C-REACTIVE PROTEIN: CRP: 0.7 mg/dL (ref <1.0)

## 2024-04-25 LAB — VITAMIN B12: Vitamin B-12: 1356 pg/mL — ABNORMAL HIGH (ref 180–914)

## 2024-04-25 LAB — SEDIMENTATION RATE: Sed Rate: 6 mm/h (ref 0–30)

## 2024-04-25 NOTE — Progress Notes (Signed)
 [facility] [address] [place] [address]   Clinic Day:  04/25/2024  Referring physician: Severa Rock HERO, FNP  Patient Care Team: Severa Rock HERO, FNP as PCP - General (Family Medicine) Boone Ronal Comer SHAUNNA, OD (Optometry) Therisa Benton PARAS, NP as Nurse Practitioner (Nurse Practitioner) Roddie Bring, DPM as Consulting Physician (Podiatry) Wertman, Sara E, PA-C (Neurology) Allen Poe, OD (Optometry)   ASSESSMENT & PLAN:   Assessment:  1.  Thrombocytosis/normocytic anemia: - Patient seen at the request of Rock Severa, FNP - She has elevated platelet count ranging between 471-508 since 04/2022 - She has mild anemia since 04/2022, intermittent microcytosis - She is taking iron  tablet every other day since January 2025.  Causes constipation and uses fiber supplements.  Colonoscopy in 2019.  No BRBPR/melena. - No B symptoms.  No prior thrombosis/MI/CVA.  2. Social/Family History: -Lives at home alone. Independent ADL's and IADL's. No tobacco use. No chemical exposures.  -3 maternal nieces had breast cancer.   Plan:  1.  Thrombocytosis: - Most likely reactive thrombocytosis.  Etiology include low ferritin and iron  levels. - We will check for inflammatory status.  Will also check for myeloproliferative neoplasms.  RTC 3 weeks to discuss results.  2.  Normocytic anemia: - Anemia in the setting of CKD and possible functional iron  deficiency. - Will check for nutritional deficiencies and bone marrow infiltrative process.   Orders Placed This Encounter  Procedures   JAK2 [id number] rfx CALR/MPL/E12-15    Standing Status:   Future    Number of Occurrences:   1    Expected Date:   04/25/2024    Expiration Date:   07/24/2024   Iron  and TIBC ([facility] ONLY)    Standing Status:   Future    Number of Occurrences:   1    Expected Date:   04/25/2024    Expiration Date:   07/24/2024   Ferritin    Standing Status:   Future    Number of  Occurrences:   1    Expected Date:   04/25/2024    Expiration Date:   07/24/2024   Vitamin B12    Standing Status:   Future    Number of Occurrences:   1    Expected Date:   04/25/2024    Expiration Date:   07/24/2024   Folate    Standing Status:   Future    Number of Occurrences:   1    Expected Date:   04/25/2024    Expiration Date:   07/24/2024   Methylmalonic acid, serum    Standing Status:   Future    Number of Occurrences:   1    Expected Date:   04/25/2024    Expiration Date:   07/24/2024   Reticulocytes    Standing Status:   Future    Number of Occurrences:   1    Expected Date:   04/25/2024    Expiration Date:   07/24/2024   Lactate dehydrogenase    Standing Status:   Future    Number of Occurrences:   1    Expected Date:   04/25/2024    Expiration Date:   07/24/2024   Copper , serum    Standing Status:   Future    Number of Occurrences:   1    Expected Date:   04/25/2024    Expiration Date:   07/24/2024   CBC with Differential    Standing Status:   Future  Number of Occurrences:   1    Expected Date:   04/25/2024    Expiration Date:   07/24/2024   Sedimentation rate    Standing Status:   Future    Number of Occurrences:   1    Expected Date:   04/25/2024    Expiration Date:   07/24/2024   C-reactive protein    Standing Status:   Future    Number of Occurrences:   1    Expected Date:   04/25/2024    Expiration Date:   07/24/2024   Immunofixation electrophoresis    Standing Status:   Future    Number of Occurrences:   1    Expected Date:   04/25/2024    Expiration Date:   07/24/2024   Kappa/lambda light chains    Standing Status:   Future    Number of Occurrences:   1    Expected Date:   04/25/2024    Expiration Date:   07/24/2024   Protein electrophoresis, serum    Standing Status:   Future    Number of Occurrences:   1    Expected Date:   04/25/2024    Expiration Date:   07/24/2024      LILLETTE Hummingbird R Teague,acting as a scribe for Alean Stands, MD.,have documented all  relevant documentation on the behalf of Alean Stands, MD,as directed by  Alean Stands, MD while in the presence of Alean Stands, MD.   I, Alean Stands MD, have reviewed the above documentation for accuracy and completeness, and I agree with the above.   Alean Stands, MD   7/8/20252:27 PM  CHIEF COMPLAINT/PURPOSE OF CONSULT:   Diagnosis: Thrombocytosis and normocytic anemia  Current Therapy: Under workup  HISTORY OF PRESENT ILLNESS:   Andrea Shannon is a 78 y.o. female presenting to clinic today for evaluation of thrombocytosis at the request of Rakes, Rock HERO, FNP.  Patient has a medical history of hypertension, DM type II, hypothyroidism, GERD, and lumbar radiculopathy.   Pema was seen by her PCP on 04/18/24 for regular follow-up of DM with labs done the same day. CBC diff from 04/18/24 showed low HGB at 10.6, low MCH at 24.4, low MCHC at 29.5, elevated RDW at 17.1, and elevated platelets at 493. Platelets have been elevated since 04/22/2022 and she has had anemia since then as well. CMP from 04/18/24 found elevated creatinine at 1.05 and low eGFR at 55.   Her most recent colonoscopy was in 2019. She denies any personal history of cancer.   Today, she states that she is doing well overall. Her appetite level is at 40%. Her energy level is at 80%. Denetta has been taking iron  supplements every other day since January 2025 and notes constipation due to the medication. Constipation is improved with fiber supplements and adequate hydration. She denies any unintentional weight loss, fevers, or recent infections. She does report night and day sweats.   Lakshmi denies any history of MI's, CVA's, or TIA's.   PAST MEDICAL HISTORY:   Past Medical History: Past Medical History:  Diagnosis Date   Acid reflux    Arthritis    Diabetes mellitus, type II (HCC)    Hypertension    Hyperthyroidism    Hypothyroidism     Surgical History: Past Surgical History:   Procedure Laterality Date   ABDOMINAL HYSTERECTOMY      Social History: Social History   Socioeconomic History   Marital status: Widowed    Spouse name: Not on file  Number of children: 1   Years of education: 12   Highest education level: High school graduate  Occupational History   Not on file  Tobacco Use   Smoking status: Never   Smokeless tobacco: Never  Vaping Use   Vaping status: Never Used  Substance and Sexual Activity   Alcohol  use: Not Currently   Drug use: Never   Sexual activity: Not Currently    Birth control/protection: Surgical  Other Topics Concern   Not on file  Social History Narrative   Right handed   Drinks caffeine coffee   Lives alone   Retired   One son   Social Drivers of Corporate investment banker Strain: Low Risk  (01/11/2024)   Overall Financial Resource Strain (CARDIA)    Difficulty of Paying Living Expenses: Not hard at all  Food Insecurity: No Food Insecurity (04/25/2024)   Hunger Vital Sign    Worried About Running Out of Food in the Last Year: Never true    Ran Out of Food in the Last Year: Never true  Transportation Needs: No Transportation Needs (04/25/2024)   PRAPARE - Administrator, Civil Service (Medical): No    Lack of Transportation (Non-Medical): No  Physical Activity: Insufficiently Active (01/11/2024)   Exercise Vital Sign    Days of Exercise per Week: 3 days    Minutes of Exercise per Session: 30 min  Stress: No Stress Concern Present (01/11/2024)   [employer] of Occupational Health - Occupational Stress Questionnaire    Feeling of Stress : Not at all  Social Connections: Moderately Integrated (01/11/2024)   Social Connection and Isolation Panel    Frequency of Communication with Friends and Family: More than three times a week    Frequency of Social Gatherings with Friends and Family: Three times a week    Attends Religious Services: More than 4 times per year    Active Member of Clubs or  Organizations: Yes    Attends Banker Meetings: More than 4 times per year    Marital Status: Widowed  Intimate Partner Violence: Not At Risk (04/25/2024)   Humiliation, Afraid, Rape, and Kick questionnaire    Fear of Current or Ex-Partner: No    Emotionally Abused: No    Physically Abused: No    Sexually Abused: No    Family History: Family History  Problem Relation Age of Onset   Diabetes Mother    Asthma Father    Heart disease Sister        CHF   Diabetes Sister    Asthma Sister    Arthritis Sister    Diabetes Son    Breast cancer Niece     Current Medications:  Current Outpatient Medications:    aspirin 81 MG chewable tablet, Chew by mouth daily., Disp: , Rfl:    diclofenac  (VOLTAREN ) 75 MG EC tablet, Take 1 tablet (75 mg total) by mouth 2 (two) times daily., Disp: 180 tablet, Rfl: 0   levothyroxine  (SYNTHROID ) 125 MCG tablet, Take 1/2 Tablet by mouth every morning before breakfast., Disp: 15 tablet, Rfl: 2   lisinopril -hydrochlorothiazide  (ZESTORETIC ) 20-12.5 MG tablet, Take 1 tablet by mouth daily., Disp: 180 tablet, Rfl: 1   meclizine  (ANTIVERT ) 12.5 MG tablet, Take 1 tablet (12.5 mg total) by mouth 3 (three) times daily as needed for dizziness., Disp: 30 tablet, Rfl: 2   metFORMIN  (GLUCOPHAGE ) 500 MG tablet, Take 1 tablet (500 mg total) by mouth 2 (two) times daily with a  meal., Disp: 180 tablet, Rfl: 1   pantoprazole  (PROTONIX ) 40 MG tablet, Take 1 tablet (40 mg total) by mouth daily., Disp: 90 tablet, Rfl: 1   sucralfate  (CARAFATE ) 1 g tablet, Take 1 tablet (1 g total) by mouth 2 (two) times daily., Disp: 180 tablet, Rfl: 0   vitamin B-12 (CYANOCOBALAMIN ) 100 MCG tablet, Take 100 mcg by mouth daily., Disp: , Rfl:    Allergies: No Known Allergies  REVIEW OF SYSTEMS:   Review of Systems  Constitutional:  Negative for chills, fatigue and fever.  HENT:   Negative for lump/mass, mouth sores, nosebleeds, sore throat and trouble swallowing.   Eyes:   Negative for eye problems.  Respiratory:  Positive for shortness of breath. Negative for cough.   Cardiovascular:  Negative for chest pain, leg swelling and palpitations.  Gastrointestinal:  Negative for abdominal pain, constipation, diarrhea, nausea and vomiting.  Genitourinary:  Negative for bladder incontinence, difficulty urinating, dysuria, frequency, hematuria and nocturia.   Musculoskeletal:  Negative for arthralgias, back pain, flank pain, myalgias and neck pain.  Skin:  Negative for itching and rash.  Neurological:  Negative for dizziness, headaches and numbness.       +tingling in hands  Hematological:  Does not bruise/bleed easily.  Psychiatric/Behavioral:  Negative for depression, sleep disturbance and suicidal ideas. The patient is not nervous/anxious.   All other systems reviewed and are negative.    VITALS:   Blood pressure 133/76, pulse 94, temperature 98.9 F (37.2 C), temperature source Tympanic, resp. rate 18, height 5' 3 (1.6 m), weight 142 lb (64.4 kg), SpO2 98%.  Wt Readings from Last 3 Encounters:  04/25/24 142 lb (64.4 kg)  04/18/24 145 lb 9.6 oz (66 kg)  01/11/24 145 lb (65.8 kg)    Body mass index is 25.15 kg/m.   PHYSICAL EXAM:   Physical Exam Vitals and nursing note reviewed. Exam conducted with a chaperone present.  Constitutional:      Appearance: Normal appearance.  Cardiovascular:     Rate and Rhythm: Normal rate and regular rhythm.     Pulses: Normal pulses.     Heart sounds: Normal heart sounds.  Pulmonary:     Effort: Pulmonary effort is normal.     Breath sounds: Normal breath sounds.  Abdominal:     Palpations: Abdomen is soft. There is no hepatomegaly, splenomegaly or mass.     Tenderness: There is no abdominal tenderness.  Musculoskeletal:     Right lower leg: No edema.     Left lower leg: No edema.  Lymphadenopathy:     Cervical: No cervical adenopathy.     Right cervical: No superficial, deep or posterior cervical adenopathy.     Left cervical: No superficial, deep or posterior cervical adenopathy.     Upper Body:     Right upper body: No supraclavicular or axillary adenopathy.     Left upper body: No supraclavicular or axillary adenopathy.  Neurological:     General: No focal deficit present.     Mental Status: She is alert and oriented to person, place, and time.  Psychiatric:        Mood and Affect: Mood normal.        Behavior: Behavior normal.     LABS:   CBC    Component Value Date/Time   WBC 4.7 04/18/2024 1038   RBC 4.35 04/18/2024 1038   HGB 10.6 (L) 04/18/2024 1038   HCT 35.9 04/18/2024 1038   PLT 493 (H) 04/18/2024 1038  MCV 83 04/18/2024 1038   MCH 24.4 (L) 04/18/2024 1038   MCHC 29.5 (L) 04/18/2024 1038   RDW 17.1 (H) 04/18/2024 1038   LYMPHSABS 1.6 04/18/2024 1038   EOSABS 0.1 04/18/2024 1038   BASOSABS 0.0 04/18/2024 1038    CMP    Component Value Date/Time   NA 143 04/18/2024 1038   K 4.1 04/18/2024 1038   CL 102 04/18/2024 1038   CO2 23 04/18/2024 1038   GLUCOSE 74 04/18/2024 1038   BUN 14 04/18/2024 1038   CREATININE 1.05 (H) 04/18/2024 1038   CALCIUM 9.9 04/18/2024 1038   PROT 6.4 04/18/2024 1038   ALBUMIN 4.4 04/18/2024 1038   AST 12 04/18/2024 1038   ALT 12 04/18/2024 1038   ALKPHOS 66 04/18/2024 1038   BILITOT <0.2 04/18/2024 1038    No results found for: CEA1, CEA / No results found for: CEA1, CEA No results found for: PSA1 No results found for: CAN199 No results found for: RJW874  Lab Results  Component Value Date   ALBUMINELP 4.6 05/14/2023   A1GS 0.3 05/14/2023   A2GS 0.5 05/14/2023   BETS 0.5 05/14/2023   BETA2SER 0.4 05/14/2023   GAMS 0.9 05/14/2023   SPEI  05/14/2023     Comment:     Normal Serum Protein Electrophoresis Pattern. No abnormal protein bands (M-protein) detected.    No results found for: TIBC, FERRITIN, IRONPCTSAT No results found for: LDH   STUDIES:   MM 3D SCREENING MAMMOGRAM BILATERAL BREAST Result  Date: 04/14/2024 CLINICAL DATA:  Screening. EXAM: DIGITAL SCREENING BILATERAL MAMMOGRAM WITH TOMOSYNTHESIS AND CAD TECHNIQUE: Bilateral screening digital craniocaudal and mediolateral oblique mammograms were obtained. Bilateral screening digital breast tomosynthesis was performed. The images were evaluated with computer-aided detection. COMPARISON:  Previous exam(s). ACR Breast Density Category b: There are scattered areas of fibroglandular density. FINDINGS: There are no findings suspicious for malignancy. IMPRESSION: No mammographic evidence of malignancy. A result letter of this screening mammogram will be mailed directly to the patient. RECOMMENDATION: Screening mammogram in one year. (Code:SM-B-01Y) BI-RADS CATEGORY  1: Negative. Electronically Signed   By: Norleen Croak M.D.   On: 04/14/2024 14:02

## 2024-04-25 NOTE — Patient Instructions (Signed)
 You were seen and examined today by Dr. Rogers. Dr. Rogers is a hematologist, meaning that he specializes in blood abnormalities. Dr. Rogers discussed your past medical history, family history of cancers/blood conditions and the events that led to you being here today.  You were referred to Dr. Rogers due to thrombocytosis (elevated platelet count) and anemia (low hemoglobin).  Dr. Katragadda has recommended additional labs today for further evaluation.  Follow-up as scheduled.

## 2024-04-26 LAB — PROTEIN ELECTROPHORESIS, SERUM
A/G Ratio: 1.5 (ref 0.7–1.7)
Albumin ELP: 4.3 g/dL (ref 2.9–4.4)
Alpha-1-Globulin: 0.2 g/dL (ref 0.0–0.4)
Alpha-2-Globulin: 0.5 g/dL (ref 0.4–1.0)
Beta Globulin: 1.2 g/dL (ref 0.7–1.3)
Gamma Globulin: 0.9 g/dL (ref 0.4–1.8)
Globulin, Total: 2.8 g/dL (ref 2.2–3.9)
Total Protein ELP: 7.1 g/dL (ref 6.0–8.5)

## 2024-04-26 LAB — KAPPA/LAMBDA LIGHT CHAINS
Kappa free light chain: 25.3 mg/L — ABNORMAL HIGH (ref 3.3–19.4)
Kappa, lambda light chain ratio: 1.87 — ABNORMAL HIGH (ref 0.26–1.65)
Lambda free light chains: 13.5 mg/L (ref 5.7–26.3)

## 2024-04-27 DIAGNOSIS — H2512 Age-related nuclear cataract, left eye: Secondary | ICD-10-CM | POA: Diagnosis not present

## 2024-04-27 DIAGNOSIS — H401131 Primary open-angle glaucoma, bilateral, mild stage: Secondary | ICD-10-CM | POA: Diagnosis not present

## 2024-04-27 DIAGNOSIS — H2513 Age-related nuclear cataract, bilateral: Secondary | ICD-10-CM | POA: Diagnosis not present

## 2024-04-27 LAB — COPPER, SERUM: Copper: 116 ug/dL (ref 80–158)

## 2024-04-27 LAB — METHYLMALONIC ACID, SERUM: Methylmalonic Acid, Quantitative: 232 nmol/L (ref 0–378)

## 2024-04-28 LAB — IMMUNOFIXATION ELECTROPHORESIS
IgA: 173 mg/dL (ref 64–422)
IgG (Immunoglobin G), Serum: 971 mg/dL (ref 586–1602)
IgM (Immunoglobulin M), Srm: 36 mg/dL (ref 26–217)
Total Protein ELP: 7 g/dL (ref 6.0–8.5)

## 2024-05-01 LAB — JAK2 V617F RFX CALR/MPL/E12-15

## 2024-05-01 LAB — CALR +MPL + E12-E15  (REFLEX)

## 2024-05-03 ENCOUNTER — Ambulatory Visit (INDEPENDENT_AMBULATORY_CARE_PROVIDER_SITE_OTHER): Admitting: Physician Assistant

## 2024-05-03 ENCOUNTER — Encounter: Payer: Self-pay | Admitting: Physician Assistant

## 2024-05-03 VITALS — BP 132/80 | HR 89 | Resp 20 | Wt 144.0 lb

## 2024-05-03 DIAGNOSIS — G3184 Mild cognitive impairment, so stated: Secondary | ICD-10-CM

## 2024-05-03 NOTE — Progress Notes (Signed)
 Assessment/Plan:   Mild cognitive impairment of unclear etiology   Andrea Shannon is a very pleasant 78 y.o. RH female with a history of hypertension, hypothyroidism, GERD, arthritis, vitamin D  deficiency, iron  deficiency anemia DM with neuropathy and a diagnosis of mild cognitive impairment of unclear etiology but with a deficit profile not characteristic of classic Alzheimer's disease, seen today in follow up for memory loss.  Memory stable today, MMSE 28/30.  Patient is not on donepezil  5 mg daily, due to leg cramps, but wants to try it again I still have some at home and going to give it a try again .  Patient is able to participate on ADLsand to drive without significant difficulties.  Mood is good.    Follow up in 1 year  Patient to retry  donepezil  5 mg daily, side effects discussed  Recommend good control of her cardiovascular risk factors Continue to control mood as per PCP Repeat neuropsych evaluation in 12 -18 months for  diagnostic clarity Monitor driving  Bilateral Leg paresthesias and pain    EMG-NCS 06/03/2023 was negative for large fiber neuropathy. MRI of the lumbar spine without contrast remarkable for significant lumbar stenosis/radiculopathy . Patient decided against using Cymbalta  and will prefer to do PT, lumbar injections, if indicated by neurosurgery.    Continue B12 supplements Continue iron  supplements, follow-up to cancer center for anemia and thrombocytosis Recommended control of A1c      Subjective:    This patient is here alone.  Previous records as well as any outside records available were reviewed prior to todays visit. Patient was last seen on 11/03/2023 with MMSE 28/30    Any changes in memory since last visit?  It comes and goes but overall ok.  She has more difficulty with names and names of people that she knows. Does crossword puzzles  a lot, it is relaxing.  She tries to stay active, doing some brain stimulating exercises  repeats  oneself?  Endorsed Disoriented when walking into a room? Denies    Leaving objects?  May misplace things but not in unusual places   Wandering behavior?  denies   Any personality changes since last visit?  I have a little anxiety sometimes.  Any worsening depression?:  Denies.   Hallucinations or paranoia?  Denies.   Seizures? denies    Any sleep changes? Sleep fair depending of the pain the leg Denies vivid dreams, REM behavior or sleepwalking   Sleep apnea?   Denies.   Any hygiene concerns? Denies.  Independent of bathing and dressing?  Endorsed  Does the patient needs help with medications?  Patient is in charge   Who is in charge of the finances? Patient is in charge     Any changes in appetite?  denies     Patient have trouble swallowing? Denies.   Does the patient cook? No Any headaches?   denies   Any vision changes?  Denies Chronic pain?Endorsed, chronic back pain, she is to see neurosurgery for  injections after trying physical therapy  Ambulates with difficulty? Denies.  She walks 2 days a week at the [facility] moving helps   Recent falls or head injuries? Denies.     Unilateral weakness, numbness or tingling?  Has a history of peripheral neuropathy as above, declined continuing on Cymbalta , not interested in any other medications for now. Any tremors?  Denies    Any anosmia?  Denies   Any incontinence of urine?  Endorsed   Any bowel dysfunction?  Denies      Patient lives alone     Does the patient drive? Yes, she denies any issues     MRI of the brain results from 06/09/2023,  personally reviewed, remarkable for mild generalized cerebral and cerebellar atrophy, otherwise unremarkable noncontrast MRI appearance of the brain for age   Initial visit 05/14/23 How long did patient have memory difficulties? For years need a little time to remember but I do. Patient denies difficulty remembering recent conversations and has more difficulty with  people names but  I am good  at remembering faces repeats oneself?  Endorsed Disoriented when walking into a room?  Patient denies except occasionally not remembering what patient came to the room for    Leaving objects in unusual places? denies   Wandering behavior?  denies   Any personality changes?    I have mood swings but not as bad as when in menopause Any history of depression?: Endorsed, dating back for many years, but I know how to get myself out of it  Hallucinations or paranoia?  Patient denies   Seizures?   Patient denies    Any sleep changes?  I don't  sleep that well. I have vivid dreams, denies REM behavior or sleepwalking   Sleep apnea?  Patient denies   Any hygiene concerns?  Patient denies   Independent of bathing and dressing?  Endorsed  Does the patient needs help with medications? Patient is in charge   Who is in charge of the finances? Patient is in charge     Any changes in appetite?  Not too good because of my GERD Patient have trouble swallowing? denies   Does the patient cook? Nothing much .   Any kitchen accidents such as leaving the stove on? denies   Any headaches?   denies   Chronic back pain ? denies   Ambulates with difficulty?  denies   Recent falls or head injuries? denies   Vision changes?I have cataracts'  Unilateral weakness, numbness or tingling?  She has a history of paresthesias in the setting of DM2.  She is not on any medications for this, tried gabapentin  but was too sleepy with it.  For the last 2 years she reports a burning cold sensation in the feet, sometimes with leg cramps. Feeling is intermittent She is not sure what makes it better or worse. She states that her feet stay cold all day . Support socks help a little.  Denies imbalance when walking.  The patient has not  noticed any recent skin rashes , fever, night sweats, anorexia or unintentional weight loss  Any tremors?   denies   Any anosmia?  denies   Any incontinence of urine?  She has a history of  bladder prolapse.  Any bowel dysfunction? denies      Patient lives alone History of heavy alcohol  intake? denies   History of heavy tobacco use? denies   Family history of dementia? denies  Does patient drive? Yes no issues     EMG remarkable for chronic L3-L4 radiculopathy affecting bilateral lower extremities, mild.  There is no evidence of large fiber sensorimotor polyneuropathy affecting the lower extremities.   MRI L spine wo contrast 07/29/23 personally reviewed shows 1. Progressive advanced facet hypertrophy and grade 1anterolisthesis at L4-5 with progressive uncovering of a broad-based disc protrusion. This leads to progressive moderate central canal stenosis with left greater than right subarticular stenosis.2. Mild to moderate foraminal narrowing bilaterally at L4-5.3. Shallow central disc  protrusion at L5-S1 potentially contacts the traversing S1 nerve roots bilaterally.4. New leftward disc protrusion at L3-4 extends into the left neural foramen without significant focal stenosis.  Neurocognitive testing 01/19/2024.   Briefly, results indicated primary deficits in executive functioning and memory, particularly in encoding and retrieval, with relatively preserved storage. These deficits are present in the context of intact functional independence, supporting a diagnosis of mild cognitive impairment. Considering pertinent background information, the pattern of findings does not necessarily implicate a clear etiology at this time. It is possible that the deficits identified today reflect the early changes of an evolving process, highlighting the importance of ongoing monitoring. Currently, the deficit profile is not characteristic of classic Alzheimer's disease, though this etiology cannot be ruled out unequivocally. While hearing loss, variable sleep, and vascular risk factors (in the absence of related neuroimaging findings) can contribute to cognitive difficulties, these factors alone are  unlikely to fully account for the pattern of deficits observed today.    PREVIOUS MEDICATIONS:   CURRENT MEDICATIONS:  Outpatient Encounter Medications as of 05/03/2024  Medication Sig   aspirin 81 MG chewable tablet Chew by mouth daily.   diclofenac  (VOLTAREN ) 75 MG EC tablet Take 1 tablet (75 mg total) by mouth 2 (two) times daily.   levothyroxine  (SYNTHROID ) 125 MCG tablet Take 1/2 Tablet by mouth every morning before breakfast.   lisinopril -hydrochlorothiazide  (ZESTORETIC ) 20-12.5 MG tablet Take 1 tablet by mouth daily.   meclizine  (ANTIVERT ) 12.5 MG tablet Take 1 tablet (12.5 mg total) by mouth 3 (three) times daily as needed for dizziness.   metFORMIN  (GLUCOPHAGE ) 500 MG tablet Take 1 tablet (500 mg total) by mouth 2 (two) times daily with a meal.   pantoprazole  (PROTONIX ) 40 MG tablet Take 1 tablet (40 mg total) by mouth daily.   sucralfate  (CARAFATE ) 1 g tablet Take 1 tablet (1 g total) by mouth 2 (two) times daily.   vitamin B-12 (CYANOCOBALAMIN ) 100 MCG tablet Take 100 mcg by mouth daily.   No facility-administered encounter medications on file as of 05/03/2024.       05/03/2024    5:00 PM 11/03/2023   12:00 PM  MMSE - Mini Mental State Exam  Orientation to time 5 5  Orientation to Place 5 5  Registration 3 3  Attention/ Calculation 5 5  Recall 2 2  Language- name 2 objects 2 2  Language- repeat 1 1  Language- follow 3 step command 3 3  Language- read & follow direction 1 1  Write a sentence 1 1  Copy design 0 0  Total score 28 28      05/14/2023    5:00 PM  Montreal Cognitive Assessment   Visuospatial/ Executive (0/5) 2  Naming (0/3) 2  Attention: Read list of digits (0/2) 2  Attention: Read list of letters (0/1) 0  Attention: Serial 7 subtraction starting at 100 (0/3) 0  Language: Repeat phrase (0/2) 1  Language : Fluency (0/1) 0  Abstraction (0/2) 2  Delayed Recall (0/5) 2  Orientation (0/6) 6  Total 17  Adjusted Score (based on education) 18     Objective:     PHYSICAL EXAMINATION:    VITALS:   Vitals:   05/03/24 1038  BP: 132/80  Pulse: 89  Resp: 20  SpO2: 98%  Weight: 144 lb (65.3 kg)    GEN:  The patient appears stated age and is in NAD. HEENT:  Normocephalic, atraumatic.   Neurological examination:  General: NAD, well-groomed, appears stated age. Orientation: The  patient is alert. Oriented to person, place and date Cranial nerves: There is good facial symmetry.The speech is fluent and clear. No aphasia or dysarthria. Fund of knowledge is appropriate. Recent and remote memory are impaired. Attention and concentration are reduced. Able to name objects and repeat phrases.  Hearing is intact to conversational tone.  Delayed recall 2/3 Sensation: Sensation is intact to light touch throughout Motor: Strength is at least antigravity x4. DTR's 2/4 in UE/LE     Movement examination: Tone: There is normal tone in the UE/LE Abnormal movements:  no tremor.  No myoclonus.  No asterixis.   Coordination:  There is no decremation with RAM's. Normal finger to nose  Gait and Station: The patient has some difficulty arising out of a deep-seated chair without the use of the hands. The patient's stride length is good.  Gait is cautious and narrow.    Thank you for allowing us  the opportunity to participate in the care of this nice patient. Please do not hesitate to contact us  for any questions or concerns.   Total time spent on today's visit was 37 minutes dedicated to this patient today, preparing to see patient, examining the patient, ordering tests and/or medications and counseling the patient, documenting clinical information in the EHR or other health record, independently interpreting results and communicating results to the patient/family, discussing treatment and goals, answering patient's questions and coordinating care.  Cc:  Severa Rock HERO, FNP  Camie Sevin 05/03/2024 5:31 PM

## 2024-05-03 NOTE — Patient Instructions (Signed)
 It was a pleasure to see you today at our office.   Recommendations:  Restart donepezil  5 mg daily in the morning  Follow up in 1 year  Continue B12 for neuropathy Continue follow up  Neurosurgery for back problems  Take you iron  for iron  deficiency anemia  Monitor your sugars      [email or web address]   RECOMMENDATIONS FOR ALL PATIENTS WITH MEMORY PROBLEMS: 1. Continue to exercise (Recommend 30 minutes of walking everyday, or 3 hours every week) 2. Increase social interactions - continue going to [place] and enjoy social gatherings with friends and family 3. Eat healthy, avoid fried foods and eat more fruits and vegetables 4. Maintain adequate blood pressure, blood sugar, and blood cholesterol level. Reducing the risk of stroke and cardiovascular disease also helps promoting better memory. 5. Avoid stressful situations. Live a simple life and avoid aggravations. Organize your time and prepare for the next day in anticipation. 6. Sleep well, avoid any interruptions of sleep and avoid any distractions in the bedroom that may interfere with adequate sleep quality 7. Avoid sugar, avoid sweets as there is a strong link between excessive sugar intake, diabetes, and cognitive impairment We discussed the Mediterranean diet, which has been shown to help patients reduce the risk of progressive memory disorders and reduces cardiovascular risk. This includes eating fish, eat fruits and green leafy vegetables, nuts like almonds and hazelnuts, walnuts, and also use olive oil. Avoid fast foods and fried foods as much as possible. Avoid sweets and sugar as sugar use has been linked to worsening of memory function.  There is always a concern of gradual progression of memory problems. If this is the case, then we may need to adjust level of care according to patient needs. Support, both to the patient and caregiver, should then be put into place.       You have been referred for a neuropsychological evaluation (i.e., evaluation of memory and thinking abilities). Please bring someone with you to this appointment if possible, as it is helpful for the doctor to hear from both you and another adult who knows you well. Please bring eyeglasses and hearing aids if you wear them.    The evaluation will take approximately 3 hours and has two parts:   The first part is a clinical interview with the neuropsychologist (Dr. Richie or Dr. Jackquline). During the interview, the neuropsychologist will speak with you and the individual you brought to the appointment.    The second part of the evaluation is testing with the doctor's technician Neal or Luke). During the testing, the technician will ask you to remember different types of material, solve problems, and answer some questionnaires. Your family member will not be present for this portion of the evaluation.   Please note: We must reserve several hours of the neuropsychologist's time and the psychometrician's time for your evaluation appointment. As such, there is a No-Show fee of $100. If you are unable to attend any of your appointments, please contact our office as soon as possible to reschedule.      DRIVING: Regarding driving, in patients with progressive memory problems, driving will be impaired. We advise to have someone else do the driving if trouble finding directions or if minor accidents are reported. Independent driving assessment is available to determine safety of driving.   If you are interested in the driving assessment, you can contact the following:  [facility] [employer] [facility] in [place] [phone]  [facility] [phone]  [facility]  [phone]  [facility] [phone] or [phone]   FALL PRECAUTIONS: Be cautious when walking. Scan the area for obstacles that may increase the risk of trips and falls. When getting up in the  mornings, sit up at the edge of the bed for a few minutes before getting out of bed. Consider elevating the bed at the head end to avoid drop of blood pressure when getting up. Walk always in a well-lit room (use night lights in the walls). Avoid area rugs or power cords from appliances in the middle of the walkways. Use a walker or a cane if necessary and consider physical therapy for balance exercise. Get your eyesight checked regularly.  FINANCIAL OVERSIGHT: Supervision, especially oversight when making financial decisions or transactions is also recommended.  HOME SAFETY: Consider the safety of the kitchen when operating appliances like stoves, microwave oven, and blender. Consider having supervision and share cooking responsibilities until no longer able to participate in those. Accidents with firearms and other hazards in the house should be identified and addressed as well.   ABILITY TO BE LEFT ALONE: If patient is unable to contact 911 operator, consider using LifeLine, or when the need is there, arrange for someone to stay with patients. Smoking is a fire hazard, consider supervision or cessation. Risk of wandering should be assessed by caregiver and if detected at any point, supervision and safe proof recommendations should be instituted.  MEDICATION SUPERVISION: Inability to self-administer medication needs to be constantly addressed. Implement a mechanism to ensure safe administration of the medications.      Mediterranean Diet A Mediterranean diet refers to food and lifestyle choices that are based on the traditions of countries located on the [employer]. This way of eating has been shown to help prevent certain conditions and improve outcomes for people who have chronic diseases, like kidney disease and heart disease. What are tips for following this plan? Lifestyle  Cook and eat meals together with your family, when possible. Drink enough fluid to keep your urine clear or  pale yellow. Be physically active every day. This includes: Aerobic exercise like running or swimming. Leisure activities like gardening, walking, or housework. Get 7-8 hours of sleep each night. If recommended by your health care provider, drink red wine in moderation. This means 1 glass a day for nonpregnant women and 2 glasses a day for men. A glass of wine equals 5 oz (150 mL). Reading food labels  Check the serving size of packaged foods. For foods such as rice and pasta, the serving size refers to the amount of cooked product, not dry. Check the total fat in packaged foods. Avoid foods that have saturated fat or trans fats. Check the ingredients list for added sugars, such as corn syrup. Shopping  At the grocery store, buy most of your food from the areas near the walls of the store. This includes: Fresh fruits and vegetables (produce). Grains, beans, nuts, and seeds. Some of these may be available in unpackaged forms or large amounts (in bulk). Fresh seafood. Poultry and eggs. Low-fat dairy products. Buy whole ingredients instead of prepackaged foods. Buy fresh fruits and vegetables in-season from local farmers markets. Buy frozen fruits and vegetables in resealable bags. If you do not have access to quality fresh seafood, buy precooked frozen shrimp or canned fish, such as tuna, salmon, or sardines. Buy small amounts of raw or cooked vegetables, salads, or olives from the deli or salad bar at your store. Stock your pantry so you always  have certain foods on hand, such as olive oil, canned tuna, canned tomatoes, rice, pasta, and beans. Cooking  Cook foods with extra-virgin olive oil instead of using butter or other vegetable oils. Have meat as a side dish, and have vegetables or grains as your main dish. This means having meat in small portions or adding small amounts of meat to foods like pasta or stew. Use beans or vegetables instead of meat in common dishes like chili or  lasagna. Experiment with different cooking methods. Try roasting or broiling vegetables instead of steaming or sauteing them. Add frozen vegetables to soups, stews, pasta, or rice. Add nuts or seeds for added healthy fat at each meal. You can add these to yogurt, salads, or vegetable dishes. Marinate fish or vegetables using olive oil, lemon juice, garlic, and fresh herbs. Meal planning  Plan to eat 1 vegetarian meal one day each week. Try to work up to 2 vegetarian meals, if possible. Eat seafood 2 or more times a week. Have healthy snacks readily available, such as: Vegetable sticks with hummus. Greek yogurt. Fruit and nut trail mix. Eat balanced meals throughout the week. This includes: Fruit: 2-3 servings a day Vegetables: 4-5 servings a day Low-fat dairy: 2 servings a day Fish, poultry, or lean meat: 1 serving a day Beans and legumes: 2 or more servings a week Nuts and seeds: 1-2 servings a day Whole grains: 6-8 servings a day Extra-virgin olive oil: 3-4 servings a day Limit red meat and sweets to only a few servings a month What are my food choices? Mediterranean diet Recommended Grains: Whole-grain pasta. Brown rice. Bulgar wheat. Polenta. Couscous. Whole-wheat bread. [place] Made[place]. Vegetables: Artichokes. Beets. Broccoli. Cabbage. Carrots. Eggplant. Green beans. Chard. Kale. Spinach. Onions. Leeks. Peas. Squash. Tomatoes. Peppers. Radishes. Fruits: Apples. Apricots. Avocado. Berries. Bananas. Cherries. Dates. Figs. Grapes. Lemons. Melon. Oranges. Peaches. Plums. Pomegranate. Meats and other protein foods: Beans. Almonds. Sunflower seeds. Pine nuts. Peanuts. Cod. Salmon. Scallops. Shrimp. Tuna. Tilapia. Clams. Oysters. Eggs. Dairy: Low-fat milk. Cheese. Greek yogurt. Beverages: Water. Red wine. Herbal tea. Fats and oils: Extra virgin olive oil. Avocado oil. Grape seed oil. Sweets and desserts: [place] yogurt with honey. Baked apples. Poached pears. Trail mix. Seasoning and  other foods: Basil. Cilantro. Coriander. Cumin. Mint. Parsley. Sage. Rosemary. Tarragon. Garlic. Oregano. Thyme. Pepper. Balsalmic vinegar. Tahini. Hummus. Tomato sauce. Olives. Mushrooms. Limit these Grains: Prepackaged pasta or rice dishes. Prepackaged cereal with added sugar. Vegetables: Deep fried potatoes (french fries). Fruits: Fruit canned in syrup. Meats and other protein foods: Beef. Pork. Lamb. Poultry with skin. Hot dogs. [place]. Dairy: Ice cream. Sour cream. Whole milk. Beverages: Juice. Sugar-sweetened soft drinks. Beer. Liquor and spirits. Fats and oils: Butter. Canola oil. Vegetable oil. Beef fat (tallow). Lard. Sweets and desserts: Cookies. Cakes. Pies. Candy. Seasoning and other foods: Mayonnaise. Premade sauces and marinades. The items listed may not be a complete list. Talk with your dietitian about what dietary choices are right for you. Summary The Mediterranean diet includes both food and lifestyle choices. Eat a variety of fresh fruits and vegetables, beans, nuts, seeds, and whole grains. Limit the amount of red meat and sweets that you eat. Talk with your health care provider about whether it is safe for you to drink red wine in moderation. This means 1 glass a day for nonpregnant women and 2 glasses a day for men. A glass of wine equals 5 oz (150 mL). This information is not intended to replace advice given to you by your health  care provider. Make sure you discuss any questions you have with your health care provider.     Labs today [address] [facility] [phone]

## 2024-05-05 ENCOUNTER — Ambulatory Visit: Payer: Medicare Other | Admitting: Physician Assistant

## 2024-05-08 ENCOUNTER — Ambulatory Visit: Payer: Medicare Other | Admitting: Physician Assistant

## 2024-05-15 DIAGNOSIS — M5416 Radiculopathy, lumbar region: Secondary | ICD-10-CM | POA: Diagnosis not present

## 2024-05-15 DIAGNOSIS — M4316 Spondylolisthesis, lumbar region: Secondary | ICD-10-CM | POA: Diagnosis not present

## 2024-05-15 DIAGNOSIS — Z6825 Body mass index (BMI) 25.0-25.9, adult: Secondary | ICD-10-CM | POA: Diagnosis not present

## 2024-05-16 ENCOUNTER — Inpatient Hospital Stay (HOSPITAL_BASED_OUTPATIENT_CLINIC_OR_DEPARTMENT_OTHER): Admitting: Hematology

## 2024-05-16 VITALS — BP 125/56 | HR 87 | Temp 98.7°F | Resp 16 | Wt 146.6 lb

## 2024-05-16 DIAGNOSIS — D509 Iron deficiency anemia, unspecified: Secondary | ICD-10-CM | POA: Insufficient documentation

## 2024-05-16 DIAGNOSIS — R7989 Other specified abnormal findings of blood chemistry: Secondary | ICD-10-CM | POA: Diagnosis not present

## 2024-05-16 DIAGNOSIS — E1122 Type 2 diabetes mellitus with diabetic chronic kidney disease: Secondary | ICD-10-CM | POA: Diagnosis not present

## 2024-05-16 DIAGNOSIS — D508 Other iron deficiency anemias: Secondary | ICD-10-CM | POA: Diagnosis not present

## 2024-05-16 DIAGNOSIS — D75839 Thrombocytosis, unspecified: Secondary | ICD-10-CM | POA: Diagnosis not present

## 2024-05-16 DIAGNOSIS — K5900 Constipation, unspecified: Secondary | ICD-10-CM | POA: Diagnosis not present

## 2024-05-16 DIAGNOSIS — I129 Hypertensive chronic kidney disease with stage 1 through stage 4 chronic kidney disease, or unspecified chronic kidney disease: Secondary | ICD-10-CM | POA: Diagnosis not present

## 2024-05-16 NOTE — Progress Notes (Signed)
 [facility] [address] [place] [address]   Clinic Day:  05/16/2024  Referring physician: Severa Rock HERO, FNP  Patient Care Team: Severa Rock HERO, FNP as PCP - General (Family Medicine) Boone Ronal Comer SHAUNNA, OD (Optometry) Therisa Benton PARAS, NP as Nurse Practitioner (Nurse Practitioner) Roddie Bring, DPM as Consulting Physician (Podiatry) Wertman, Sara E, PA-C (Neurology) Allen Poe, OD (Optometry)   ASSESSMENT & PLAN:   Assessment:  1.  Thrombocytosis/normocytic anemia: - Patient seen at the request of Rock Severa, FNP - She has elevated platelet count ranging between 471-508 since 04/2022 - She has mild anemia since 04/2022, intermittent microcytosis - She is taking iron  tablet every other day since January 2025.  Causes constipation and uses fiber supplements.  Colonoscopy in 2019.  No BRBPR/melena. - No B symptoms.  No prior thrombosis/MI/CVA.  2. Social/Family History: -Lives at home alone. Independent ADL's and IADL's. No tobacco use. No chemical exposures.  -3 maternal nieces had breast cancer.   Plan:  1.  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  When I repeated CBC on 04/25/2024 platelet count improved to 289.  This is likely reactive thrombocytosis.  Will closely monitor.  2.  Normocytic anemia: - Anemia in the setting of CKD and functional iron  deficiency.  Ferritin is low at 30.  B12, folic acid and copper  levels were normal. - Recommend parenteral iron  therapy with Monoferric 1 g x 1.  Discussed side effects including reactions of anaphylactic reaction. - Will repeat CBC, ferritin and iron  panel in 4 months.   Orders Placed This Encounter  Procedures   CBC    Standing Status:   Future    Expected Date:   07/24/2024    Expiration Date:   10/22/2024   Iron  and TIBC ([facility] ONLY)    Standing Status:    Future    Expected Date:   07/24/2024    Expiration Date:   10/22/2024   Ferritin    Standing Status:   Future    Expected Date:   07/24/2024    Expiration Date:   10/22/2024      LILLETTE Hummingbird R Teague,acting as a scribe for Alean Stands, MD.,have documented all relevant documentation on the behalf of Alean Stands, MD,as directed by  Alean Stands, MD while in the presence of Alean Stands, MD.  I, Alean Stands MD, have reviewed the above documentation for accuracy and completeness, and I agree with the above.    Alean Stands, MD   7/29/20252:20 PM  CHIEF COMPLAINT/PURPOSE OF CONSULT:   Diagnosis: Thrombocytosis and normocytic anemia  Current Therapy: Under workup  HISTORY OF PRESENT ILLNESS:   Andrea Shannon is a 78 y.o. female presenting to clinic today for evaluation of thrombocytosis at the request of Rakes, Rock HERO, FNP.  Patient has a medical history of hypertension, DM type II, hypothyroidism, GERD, and lumbar radiculopathy.   Jazzman was seen by her PCP on 04/18/24 for regular follow-up of DM with labs done the same day. CBC diff from 04/18/24 showed low HGB at 10.6, low MCH at 24.4, low MCHC at 29.5, elevated RDW at 17.1, and elevated platelets at 493. Platelets have been elevated since 04/22/2022 and she has had anemia since then as well. CMP from 04/18/24 found elevated creatinine at 1.05 and low eGFR at 55.   Her most recent colonoscopy was in 2019. She denies any personal history of cancer.  Today, she states that she is doing well overall. Her appetite level is at 40%. Her energy level is at 80%. [name] has been taking iron  supplements every other day since January 2025 and notes constipation due to the medication. Constipation is improved with fiber supplements and adequate hydration. She denies any unintentional weight loss, fevers, or recent infections. She does report night and day sweats.   Shaunta denies any history of MI's, CVA's, or TIA's.    INTERVAL HISTORY:   Aviana Shevlin is a 78 y.o. female presenting to the clinic today for follow-up of thrombocytosis and normocytic anemia. She was last seen by me on 04/25/2024 in consultation.  Today, she states that she is doing well overall. Her appetite level is at 50%. Her energy level is at 50%.   PAST MEDICAL HISTORY:   Past Medical History: Past Medical History:  Diagnosis Date   Acid reflux    Arthritis    Diabetes mellitus, type II (HCC)    Hypertension    Hyperthyroidism    Hypothyroidism     Surgical History: Past Surgical History:  Procedure Laterality Date   ABDOMINAL HYSTERECTOMY      Social History: Social History   Socioeconomic History   Marital status: Widowed    Spouse name: Not on file   Number of children: 1   Years of education: 12   Highest education level: High school graduate  Occupational History   Not on file  Tobacco Use   Smoking status: Never   Smokeless tobacco: Never  Vaping Use   Vaping status: Never Used  Substance and Sexual Activity   Alcohol  use: Not Currently   Drug use: Never   Sexual activity: Not Currently    Birth control/protection: Surgical  Other Topics Concern   Not on file  Social History Narrative   Right handed   Drinks caffeine coffee   Lives alone   Retired   One son   Social Drivers of Corporate investment banker Strain: Low Risk  (01/11/2024)   Overall Financial Resource Strain (CARDIA)    Difficulty of Paying Living Expenses: Not hard at all  Food Insecurity: No Food Insecurity (04/25/2024)   Hunger Vital Sign    Worried About Running Out of Food in the Last Year: Never true    Ran Out of Food in the Last Year: Never true  Transportation Needs: No Transportation Needs (04/25/2024)   PRAPARE - Administrator, Civil Service (Medical): No    Lack of Transportation (Non-Medical): No  Physical Activity: Insufficiently Active (01/11/2024)   Exercise Vital Sign    Days of Exercise per Week:  3 days    Minutes of Exercise per Session: 30 min  Stress: No Stress Concern Present (01/11/2024)   [employer] of Occupational Health - Occupational Stress Questionnaire    Feeling of Stress : Not at all  Social Connections: Moderately Integrated (01/11/2024)   Social Connection and Isolation Panel    Frequency of Communication with Friends and Family: More than three times a week    Frequency of Social Gatherings with Friends and Family: Three times a week    Attends Religious Services: More than 4 times per year    Active Member of Clubs or Organizations: Yes    Attends Banker Meetings: More than 4 times per year    Marital Status: Widowed  Intimate Partner Violence: Not At Risk (04/25/2024)   Humiliation, Afraid, Rape, and Kick questionnaire  Fear of Current or Ex-Partner: No    Emotionally Abused: No    Physically Abused: No    Sexually Abused: No    Family History: Family History  Problem Relation Age of Onset   Diabetes Mother    Asthma Father    Heart disease Sister        CHF   Diabetes Sister    Asthma Sister    Arthritis Sister    Diabetes Son    Breast cancer Niece     Current Medications:  Current Outpatient Medications:    aspirin 81 MG chewable tablet, Chew by mouth daily., Disp: , Rfl:    diclofenac  (VOLTAREN ) 75 MG EC tablet, Take 1 tablet (75 mg total) by mouth 2 (two) times daily., Disp: 180 tablet, Rfl: 0   levothyroxine  (SYNTHROID ) 125 MCG tablet, Take 1/2 Tablet by mouth every morning before breakfast., Disp: 15 tablet, Rfl: 2   lisinopril -hydrochlorothiazide  (ZESTORETIC ) 20-12.5 MG tablet, Take 1 tablet by mouth daily., Disp: 180 tablet, Rfl: 1   meclizine  (ANTIVERT ) 12.5 MG tablet, Take 1 tablet (12.5 mg total) by mouth 3 (three) times daily as needed for dizziness., Disp: 30 tablet, Rfl: 2   metFORMIN  (GLUCOPHAGE ) 500 MG tablet, Take 1 tablet (500 mg total) by mouth 2 (two) times daily with a meal., Disp: 180 tablet, Rfl: 1    pantoprazole  (PROTONIX ) 40 MG tablet, Take 1 tablet (40 mg total) by mouth daily., Disp: 90 tablet, Rfl: 1   sucralfate  (CARAFATE ) 1 g tablet, Take 1 tablet (1 g total) by mouth 2 (two) times daily., Disp: 180 tablet, Rfl: 0   vitamin B-12 (CYANOCOBALAMIN ) 100 MCG tablet, Take 100 mcg by mouth daily., Disp: , Rfl:    Allergies: No Known Allergies  REVIEW OF SYSTEMS:   Review of Systems  Constitutional:  Negative for chills, fatigue and fever.  HENT:   Negative for lump/mass, mouth sores, nosebleeds, sore throat and trouble swallowing.   Eyes:  Negative for eye problems.  Respiratory:  Negative for cough and shortness of breath.   Cardiovascular:  Negative for chest pain, leg swelling and palpitations.  Gastrointestinal:  Positive for constipation. Negative for abdominal pain, diarrhea, nausea and vomiting.  Genitourinary:  Negative for bladder incontinence, difficulty urinating, dysuria, frequency, hematuria and nocturia.   Musculoskeletal:  Positive for back pain (9/10 severity). Negative for arthralgias, flank pain, myalgias and neck pain.       +bilateral leg pain, 9/10 severity  Skin:  Negative for itching and rash.  Neurological:  Positive for numbness (in hands). Negative for dizziness and headaches.  Hematological:  Does not bruise/bleed easily.  Psychiatric/Behavioral:  Negative for depression, sleep disturbance and suicidal ideas. The patient is not nervous/anxious.   All other systems reviewed and are negative.    VITALS:   Blood pressure (!) 125/56, pulse 87, temperature 98.7 F (37.1 C), temperature source Oral, resp. rate 16, weight 146 lb 9.7 oz (66.5 kg), SpO2 100%.  Wt Readings from Last 3 Encounters:  05/16/24 146 lb 9.7 oz (66.5 kg)  05/03/24 144 lb (65.3 kg)  04/25/24 142 lb (64.4 kg)    Body mass index is 25.97 kg/m.   PHYSICAL EXAM:   Physical Exam Vitals and nursing note reviewed. Exam conducted with a chaperone present.  Constitutional:       Appearance: Normal appearance.  Cardiovascular:     Rate and Rhythm: Normal rate and regular rhythm.     Pulses: Normal pulses.     Heart sounds: Normal  heart sounds.  Pulmonary:     Effort: Pulmonary effort is normal.     Breath sounds: Normal breath sounds.  Abdominal:     Palpations: Abdomen is soft. There is no hepatomegaly, splenomegaly or mass.     Tenderness: There is no abdominal tenderness.  Musculoskeletal:     Right lower leg: No edema.     Left lower leg: No edema.  Lymphadenopathy:     Cervical: No cervical adenopathy.     Right cervical: No superficial, deep or posterior cervical adenopathy.    Left cervical: No superficial, deep or posterior cervical adenopathy.     Upper Body:     Right upper body: No supraclavicular or axillary adenopathy.     Left upper body: No supraclavicular or axillary adenopathy.  Neurological:     General: No focal deficit present.     Mental Status: She is alert and oriented to person, place, and time.  Psychiatric:        Mood and Affect: Mood normal.        Behavior: Behavior normal.     LABS:   CBC    Component Value Date/Time   WBC 5.7 04/25/2024 1427   RBC 4.73 04/25/2024 1428   RBC 4.79 04/25/2024 1427   HGB 11.5 (L) 04/25/2024 1427   HGB 10.6 (L) 04/18/2024 1038   HCT 38.1 04/25/2024 1427   HCT 35.9 04/18/2024 1038   PLT 289 04/25/2024 1427   PLT 493 (H) 04/18/2024 1038   MCV 79.5 (L) 04/25/2024 1427   MCV 83 04/18/2024 1038   MCH 24.0 (L) 04/25/2024 1427   MCHC 30.2 04/25/2024 1427   RDW 15.9 (H) 04/25/2024 1427   RDW 17.1 (H) 04/18/2024 1038   LYMPHSABS 1.6 04/25/2024 1427   LYMPHSABS 1.6 04/18/2024 1038   MONOABS 0.4 04/25/2024 1427   EOSABS 0.1 04/25/2024 1427   EOSABS 0.1 04/18/2024 1038   BASOSABS 0.0 04/25/2024 1427   BASOSABS 0.0 04/18/2024 1038    CMP    Component Value Date/Time   NA 143 04/18/2024 1038   K 4.1 04/18/2024 1038   CL 102 04/18/2024 1038   CO2 23 04/18/2024 1038   GLUCOSE 74  04/18/2024 1038   BUN 14 04/18/2024 1038   CREATININE 1.05 (H) 04/18/2024 1038   CALCIUM 9.9 04/18/2024 1038   PROT 6.4 04/18/2024 1038   ALBUMIN 4.4 04/18/2024 1038   AST 12 04/18/2024 1038   ALT 12 04/18/2024 1038   ALKPHOS 66 04/18/2024 1038   BILITOT <0.2 04/18/2024 1038    No results found for: CEA1, CEA / No results found for: CEA1, CEA No results found for: PSA1 No results found for: CAN199 No results found for: RJW874  Lab Results  Component Value Date   TOTALPROTELP 7.1 04/25/2024   TOTALPROTELP 7.0 04/25/2024   ALBUMINELP 4.3 04/25/2024   A1GS 0.2 04/25/2024   A2GS 0.5 04/25/2024   BETS 1.2 04/25/2024   BETA2SER 0.4 05/14/2023   GAMS 0.9 04/25/2024   MSPIKE Not Observed 04/25/2024   SPEI Comment 04/25/2024   Lab Results  Component Value Date   TIBC 432 04/25/2024   FERRITIN 30 04/25/2024   IRONPCTSAT 19 04/25/2024   Lab Results  Component Value Date   LDH 165 04/25/2024     STUDIES:   No results found.

## 2024-05-16 NOTE — Patient Instructions (Addendum)
 [facility] at [facility] Discharge Instructions   You were seen and examined today by Dr. Rogers.  He reviewed the results of your lab work which are mostly normal/stable. Your iron  is low. We will arrange for you to have an IV iron  infusion in the clinic.   We will see you back in 2 months. We will repeat lab work prior to this visit.    Return as scheduled.    Thank you for choosing [facility] at [facility] to provide your oncology and hematology care.  To afford each patient quality time with our provider, please arrive at least 15 minutes before your scheduled appointment time.   If you have a lab appointment with the Cancer Center please come in thru the Main Entrance and check in at the main information desk.  You need to re-schedule your appointment should you arrive 10 or more minutes late.  We strive to give you quality time with our providers, and arriving late affects you and other patients whose appointments are after yours.  Also, if you no show three or more times for appointments you may be dismissed from the clinic at the providers discretion.     Again, thank you for choosing [facility].  Our hope is that these requests will decrease the amount of time that you wait before being seen by our physicians.       _____________________________________________________________  Should you have questions after your visit to [facility], please contact our office at [phone] and follow the prompts.  Our office hours are 8:00 a.m. and 4:30 p.m. Monday - Friday.  Please note that voicemails left after 4:00 p.m. may not be returned until the following business day.  We are closed weekends and major holidays.  You do have access to a nurse 24-7, just call the main number to the clinic [phone] and do not press any options, hold on the line and a nurse will answer the phone.    For prescription refill  requests, have your pharmacy contact our office and allow 72 hours.    Due to Covid, you will need to wear a mask upon entering the hospital. If you do not have a mask, a mask will be given to you at the Main Entrance upon arrival. For doctor visits, patients may have 1 support person age 76 or older with them. For treatment visits, patients can not have anyone with them due to social distancing guidelines and our immunocompromised population.

## 2024-05-17 ENCOUNTER — Telehealth: Payer: Self-pay

## 2024-05-17 NOTE — Telephone Encounter (Signed)
 Patient aware and verbalizes understanding.

## 2024-05-17 NOTE — Telephone Encounter (Signed)
 Copied from CRM [id number]. Topic: Clinical - Medical Advice >> May 17, 2024  8:11 AM Cynthia K wrote: Reason for CRM:  She is having an infusion for iron  and injection for nerve pain in her back. She wants to discuss with nurse how far apart should she have these. Please call her at [phone].

## 2024-05-17 NOTE — Telephone Encounter (Signed)
 Patient states that she goes to get a iron  infusion on 8/20 and gets her injection for her back on 9/10.  Wants to know how long in between the two does she need to wait for the next time?

## 2024-05-29 ENCOUNTER — Inpatient Hospital Stay: Attending: Hematology

## 2024-05-29 ENCOUNTER — Other Ambulatory Visit

## 2024-05-29 DIAGNOSIS — E890 Postprocedural hypothyroidism: Secondary | ICD-10-CM | POA: Diagnosis not present

## 2024-05-30 LAB — T4, FREE: Free T4: 1.95 ng/dL — ABNORMAL HIGH (ref 0.82–1.77)

## 2024-05-30 LAB — TSH: TSH: 0.155 u[IU]/mL — ABNORMAL LOW (ref 0.450–4.500)

## 2024-06-01 NOTE — Patient Instructions (Signed)

## 2024-06-05 ENCOUNTER — Encounter: Payer: Self-pay | Admitting: Nurse Practitioner

## 2024-06-05 ENCOUNTER — Ambulatory Visit (INDEPENDENT_AMBULATORY_CARE_PROVIDER_SITE_OTHER): Payer: Medicare Other | Admitting: Nurse Practitioner

## 2024-06-05 VITALS — BP 118/64 | HR 90 | Ht 63.0 in | Wt 143.4 lb

## 2024-06-05 DIAGNOSIS — E890 Postprocedural hypothyroidism: Secondary | ICD-10-CM

## 2024-06-05 DIAGNOSIS — E559 Vitamin D deficiency, unspecified: Secondary | ICD-10-CM

## 2024-06-05 MED ORDER — LEVOTHYROXINE SODIUM 50 MCG PO TABS: 50.0000 ug | ORAL_TABLET | Freq: Every day | ORAL | 0 refills | Status: DC

## 2024-06-05 MED ORDER — LEVOTHYROXINE SODIUM 50 MCG PO TABS: 50.0000 ug | ORAL_TABLET | Freq: Every day | ORAL | 3 refills | Status: AC

## 2024-06-05 NOTE — Progress Notes (Signed)
 06/05/2024, 10:59 AM     Endocrinology follow-up note    Subjective:    Patient ID: Andrea Shannon, female    DOB: [birth year], PCP Rakes, Rock HERO, FNP   Past Medical History:  Diagnosis Date   Acid reflux    Arthritis    Diabetes mellitus, type II (HCC)    Hypertension    Hyperthyroidism    Hypothyroidism    Past Surgical History:  Procedure Laterality Date   ABDOMINAL HYSTERECTOMY     Social History   Socioeconomic History   Marital status: Widowed    Spouse name: Not on file   Number of children: 1   Years of education: 12   Highest education level: High school graduate  Occupational History   Not on file  Tobacco Use   Smoking status: Never   Smokeless tobacco: Never  Vaping Use   Vaping status: Never Used  Substance and Sexual Activity   Alcohol  use: Not Currently   Drug use: Never   Sexual activity: Not Currently    Birth control/protection: Surgical  Other Topics Concern   Not on file  Social History Narrative   Right handed   Drinks caffeine coffee   Lives alone   Retired   One son   Social Drivers of Corporate investment banker Strain: Low Risk  (01/11/2024)   Overall Financial Resource Strain (CARDIA)    Difficulty of Paying Living Expenses: Not hard at all  Food Insecurity: No Food Insecurity (04/25/2024)   Hunger Vital Sign    Worried About Running Out of Food in the Last Year: Never true    Ran Out of Food in the Last Year: Never true  Transportation Needs: No Transportation Needs (04/25/2024)   PRAPARE - Administrator, Civil Service (Medical): No    Lack of Transportation (Non-Medical): No  Physical Activity: Insufficiently Active (01/11/2024)   Exercise Vital Sign    Days of Exercise per Week: 3 days    Minutes of Exercise per Session: 30 min  Stress: No Stress Concern Present (01/11/2024)   [employer] of Occupational Health - Occupational Stress Questionnaire     Feeling of Stress : Not at all  Social Connections: Moderately Integrated (01/11/2024)   Social Connection and Isolation Panel    Frequency of Communication with Friends and Family: More than three times a week    Frequency of Social Gatherings with Friends and Family: Three times a week    Attends Religious Services: More than 4 times per year    Active Member of Clubs or Organizations: Yes    Attends Banker Meetings: More than 4 times per year    Marital Status: Widowed   Outpatient Encounter Medications as of 06/05/2024  Medication Sig   aspirin 81 MG chewable tablet Chew by mouth daily.   diclofenac  (VOLTAREN ) 75 MG EC tablet Take 1 tablet (75 mg total) by mouth 2 (two) times daily.   levothyroxine  (SYNTHROID ) 50 MCG tablet Take 1 tablet (50 mcg total) by mouth daily.   [START ON 07/03/2024] levothyroxine  (SYNTHROID ) 50 MCG tablet Take 1 tablet (50 mcg total) by mouth daily.   lisinopril -hydrochlorothiazide  (ZESTORETIC ) 20-12.5 MG tablet Take 1 tablet  by mouth daily.   meclizine  (ANTIVERT ) 12.5 MG tablet Take 1 tablet (12.5 mg total) by mouth 3 (three) times daily as needed for dizziness.   metFORMIN  (GLUCOPHAGE ) 500 MG tablet Take 1 tablet (500 mg total) by mouth 2 (two) times daily with a meal.   pantoprazole  (PROTONIX ) 40 MG tablet Take 1 tablet (40 mg total) by mouth daily.   sucralfate  (CARAFATE ) 1 g tablet Take 1 tablet (1 g total) by mouth 2 (two) times daily.   vitamin B-12 (CYANOCOBALAMIN ) 100 MCG tablet Take 100 mcg by mouth daily.   [DISCONTINUED] levothyroxine  (SYNTHROID ) 125 MCG tablet Take 1/2 Tablet by mouth every morning before breakfast.   No facility-administered encounter medications on file as of 06/05/2024.   ALLERGIES: No Known Allergies  VACCINATION STATUS:  There is no immunization history on file for this patient.  Thyroid  Problem Presents for follow-up (-She was treated with I-131 on September 29, 2019 for toxic multinodular goiter.   She  reports family history of thyroid  dysfunction, family history of thyroid  malignancy.) visit. Symptoms include heat intolerance. Patient reports no anxiety, cold intolerance, constipation, depressed mood, diarrhea, fatigue, leg swelling, palpitations, tremors, weight gain or weight loss. (Says recently she has been excessively sweating, hot flashes, even without doing anything to exert such.  She notes night sweats as well.  She did tell me she is trying to use up her supply of 50 mcg pills of her Levothyroxine , so has been splitting tablets to equal the dose she needs and when it splits, it does not split evenly.) The symptoms have been stable.     Andrea Shannon is 78 y.o. female who presents today with a medical history as above.  Review of systems  Constitutional: + Minimally fluctuating body weight,  current Body mass index is 25.4 kg/m. , no fatigue, + subjective hyperthermia, no subjective hypothermia Eyes: no blurry vision, no xerophthalmia ENT: no sore throat, no nodules palpated in throat, no dysphagia/odynophagia, no hoarseness Cardiovascular: no chest pain, no shortness of breath, no palpitations, no leg swelling Respiratory: no cough, no shortness of breath Gastrointestinal: no nausea/vomiting/diarrhea Musculoskeletal: no muscle/joint aches Skin: no rashes, no hyperemia Neurological: no tremors, no numbness, no tingling, no dizziness Psychiatric: no depression, no anxiety  Objective:    BP 118/64 (BP Location: Left Arm, Patient Position: Sitting, Cuff Size: Large)   Pulse 90   Ht 5' 3 (1.6 m)   Wt 143 lb 6.4 oz (65 kg)   BMI 25.40 kg/m   Wt Readings from Last 3 Encounters:  06/05/24 143 lb 6.4 oz (65 kg)  05/16/24 146 lb 9.7 oz (66.5 kg)  05/03/24 144 lb (65.3 kg)     BP Readings from Last 3 Encounters:  06/05/24 118/64  05/16/24 (!) 125/56  05/03/24 132/80     Physical Exam- Limited  Constitutional:  Body mass index is 25.4 kg/m. , not in acute distress,  normal state of mind Eyes:  EOMI, no exophthalmos Musculoskeletal: no gross deformities, strength intact in all four extremities, no gross restriction of joint movements Skin:  no rashes, no hyperemia Neurological: no tremor with outstretched hands   Recent Results (from the past 2160 hours)  HM DIABETES EYE EXAM     Status: None   Collection Time: 03/21/24  8:53 AM  Result Value Ref Range   HM Diabetic Eye Exam No Retinopathy No Retinopathy    Comment: abst by him  Bayer DCA Hb A1c Waived     Status: Abnormal   Collection  Time: 04/18/24 10:36 AM  Result Value Ref Range   HB A1C (BAYER DCA - WAIVED) 5.8 (H) 4.8 - 5.6 %    Comment:          Prediabetes: 5.7 - 6.4          Diabetes: >6.4          Glycemic control for adults with diabetes: <7.0   CBC with Differential/Platelet     Status: Abnormal   Collection Time: 04/18/24 10:38 AM  Result Value Ref Range   WBC 4.7 3.4 - 10.8 x10E3/uL   RBC 4.35 3.77 - 5.28 x10E6/uL   Hemoglobin 10.6 (L) 11.1 - 15.9 g/dL   Hematocrit 64.0 65.9 - 46.6 %   MCV 83 79 - 97 fL   MCH 24.4 (L) 26.6 - 33.0 pg   MCHC 29.5 (L) 31.5 - 35.7 g/dL   RDW 82.8 (H) 88.2 - 84.5 %   Platelets 493 (H) 150 - 450 x10E3/uL   Neutrophils 57 Not Estab. %   Lymphs 33 Not Estab. %   Monocytes 6 Not Estab. %   Eos 3 Not Estab. %   Basos 1 Not Estab. %   Neutrophils Absolute 2.7 1.4 - 7.0 x10E3/uL   Lymphocytes Absolute 1.6 0.7 - 3.1 x10E3/uL   Monocytes Absolute 0.3 0.1 - 0.9 x10E3/uL   EOS (ABSOLUTE) 0.1 0.0 - 0.4 x10E3/uL   Basophils Absolute 0.0 0.0 - 0.2 x10E3/uL   Immature Granulocytes 0 Not Estab. %   Immature Grans (Abs) 0.0 0.0 - 0.1 x10E3/uL  CMP14+EGFR     Status: Abnormal   Collection Time: 04/18/24 10:38 AM  Result Value Ref Range   Glucose 74 70 - 99 mg/dL   BUN 14 8 - 27 mg/dL   Creatinine, Ser 8.94 (H) 0.57 - 1.00 mg/dL   eGFR 55 (L) >40 fO/fpw/8.26   BUN/Creatinine Ratio 13 12 - 28   Sodium 143 134 - 144 mmol/L   Potassium 4.1 3.5 - 5.2  mmol/L   Chloride 102 96 - 106 mmol/L   CO2 23 20 - 29 mmol/L   Calcium 9.9 8.7 - 10.3 mg/dL   Total Protein 6.4 6.0 - 8.5 g/dL   Albumin 4.4 3.8 - 4.8 g/dL   Globulin, Total 2.0 1.5 - 4.5 g/dL   Bilirubin Total <9.7 0.0 - 1.2 mg/dL   Alkaline Phosphatase 66 44 - 121 IU/L   AST 12 0 - 40 IU/L   ALT 12 0 - 32 IU/L  Lipid panel     Status: Abnormal   Collection Time: 04/18/24 10:38 AM  Result Value Ref Range   Cholesterol, Total 170 100 - 199 mg/dL   Triglycerides 763 (H) 0 - 149 mg/dL   HDL 53 >60 mg/dL   VLDL Cholesterol Cal 39 5 - 40 mg/dL   LDL Chol Calc (NIH) 78 0 - 99 mg/dL   Chol/HDL Ratio 3.2 0.0 - 4.4 ratio    Comment:                                   T. Chol/HDL Ratio                                             Men  Women  1/2 Avg.Risk  3.4    3.3                                   Avg.Risk  5.0    4.4                                2X Avg.Risk  9.6    7.1                                3X Avg.Risk 23.4   11.0   Protein electrophoresis, serum     Status: None   Collection Time: 04/25/24  2:27 PM  Result Value Ref Range   Total Protein ELP 7.1 6.0 - 8.5 g/dL   Albumin ELP 4.3 2.9 - 4.4 g/dL   Joeyj-8-Honalopw 0.2 0.0 - 0.4 g/dL   Joeyj-7-Honalopw 0.5 0.4 - 1.0 g/dL   Beta Globulin 1.2 0.7 - 1.3 g/dL   Gamma Globulin 0.9 0.4 - 1.8 g/dL   M-Spike, % Not Observed Not Observed g/dL   SPE Interp. Comment     Comment: (NOTE) The SPE pattern appears unremarkable. Evidence of monoclonal protein is not apparent. Performed At: [facility] [address] [place], [place] [id number] Jennette Shorter MD Ey:[phone]    Comment Comment     Comment: (NOTE) Protein electrophoresis scan will follow via computer, mail, or courier delivery.    Globulin, Total 2.8 2.2 - 3.9 g/dL   A/G Ratio 1.5 0.7 - 1.7  Kappa/lambda light chains     Status: Abnormal   Collection Time: 04/25/24  2:27 PM  Result Value Ref Range   Kappa free light chain  25.3 (H) 3.3 - 19.4 mg/L   Lambda free light chains 13.5 5.7 - 26.3 mg/L   Kappa, lambda light chain ratio 1.87 (H) 0.26 - 1.65    Comment: (NOTE) Performed At: [facility] [address] [place], [place] [id number] Jennette Shorter MD Ey:[phone]   Immunofixation electrophoresis     Status: None   Collection Time: 04/25/24  2:27 PM  Result Value Ref Range   Total Protein ELP 7.0 6.0 - 8.5 g/dL   IgG (Immunoglobin G), Serum 971 586 - 1,602 mg/dL   IgA 826 64 - 577 mg/dL   IgM (Immunoglobulin M), Srm 36 26 - 217 mg/dL    Comment: (NOTE) Performed At: [facility] [address] [place], [place] [id number] Jennette Shorter MD Ey:[phone]    Immunofixation Result, Serum Comment     Comment: (NOTE) The immunofixation pattern appears unremarkable. Evidence of monoclonal protein is not apparent.   Sedimentation rate     Status: None   Collection Time: 04/25/24  2:27 PM  Result Value Ref Range   Sed Rate 6 0 - 30 mm/hr    Comment: Performed at [facility], [address], [place], [place] [address]  CBC with Differential     Status: Abnormal   Collection Time: 04/25/24  2:27 PM  Result Value Ref Range   WBC 5.7 4.0 - 10.5 K/uL   RBC 4.79 3.87 - 5.11 MIL/uL   Hemoglobin 11.5 (L) 12.0 - 15.0 g/dL   HCT 61.8 63.9 - 53.9 %   MCV 79.5 (L) 80.0 - 100.0 fL   MCH 24.0 (L) 26.0 - 34.0 pg   MCHC  30.2 30.0 - 36.0 g/dL   RDW 84.0 (H) 88.4 - 84.4 %   Platelets 289 150 - 400 K/uL   nRBC 0.0 0.0 - 0.2 %   Neutrophils Relative % 63 %   Neutro Abs 3.6 1.7 - 7.7 K/uL   Lymphocytes Relative 28 %   Lymphs Abs 1.6 0.7 - 4.0 K/uL   Monocytes Relative 6 %   Monocytes Absolute 0.4 0.1 - 1.0 K/uL   Eosinophils Relative 2 %   Eosinophils Absolute 0.1 0.0 - 0.5 K/uL   Basophils Relative 1 %   Basophils Absolute 0.0 0.0 - 0.1 K/uL   Smear Review PLATELET COUNT CONFIRMED BY SMEAR     Comment: Reviewed   Immature Granulocytes 0 %   Abs Immature Granulocytes 0.02 0.00 - 0.07 K/uL    Schistocytes PRESENT    Tear Drop Cells PRESENT    Ovalocytes PRESENT     Comment: Performed at [facility], [address] [place], [place] [address]  Copper , serum     Status: None   Collection Time: 04/25/24  2:27 PM  Result Value Ref Range   Copper  116 80 - 158 ug/dL    Comment: (NOTE) This test was developed and its performance characteristics determined by [facility]. It has not been cleared or approved by the Food and Drug Administration.                                Detection Limit = 5 Performed At: [facility] [address] [place], [place] [id number] Jennette Shorter MD Ey:[phone]   Lactate dehydrogenase     Status: None   Collection Time: 04/25/24  2:27 PM  Result Value Ref Range   LDH 165 98 - 192 U/L    Comment: Performed at [facility], [address], [place], [place] [address]  Methylmalonic acid, serum     Status: None   Collection Time: 04/25/24  2:27 PM  Result Value Ref Range   Methylmalonic Acid, Quantitative 232 0 - 378 nmol/L    Comment: (NOTE) This test was developed and its performance characteristics determined by [facility]. It has not been cleared or approved by the Food and Drug Administration. Performed At: [facility] [address] [place], [place] [id number] Jennette Shorter MD Ey:[phone]   JAK2 V617F rfx CALR/MPL/E12-15     Status: None   Collection Time: 04/25/24  2:27 PM  Result Value Ref Range   Specimen Type Comment:     Comment: NOT PROVIDED   JAK2 V617F Result Comment     Comment: (NOTE) NEGATIVE The JAK2 V617F mutation is not detected in the provided specimen of this individual. Results should be interpreted in conjunction with clinical and other laboratory findings for the most accurate interpretation. This test was developed and its performance characteristics determined by [facility]. It has not been cleared or approved by the Food and Drug Administration.    Reflex Comment     Comment: (NOTE) Reflex to  CALR Mutation Analysis, JAK2 Exon 12-15 Mutation Analysis, and MPL Mutation Analysis is indicated.    V617F Rfx CALR/MPL/E12-15 Bkgd Comment     Comment: (NOTE)    Molecular testing of blood or bone marrow is useful in the evaluation of suspected myeloproliferative neoplasms (MPN). Mutations in the JAK2, MPL, and CALR genes are present in virtually all MPNs and their presence help distinguish benign reactive processes from clonal neoplasms. These mutations are generally considered mutually  exclusive, although concurrent clones have been reported in rare patients. This test will assess for the JAK2V617F (exon 14) mutation first and will reflex to CALR mutation analysis, MPL mutation analysis, and JAK2 exon 12 to 15 mutation analysis if the JAK2V617F mutation is negative.    The JAK2 (Janus kinase 2) gene encodes for a non-receptor protein tyrosine kinase that activates cytokine and growth factor signaling. The V617F (c.1849 G>T) mutation results in constitutive activation of JAK2 and downstream STAT5 and ERK signaling. The V617F mutation is observed in approximately 95% of polycythemia vera (PV), 60% of essential thromboc ythemia (ET) and primary myelofibrosis (PMF). It is also infrequently present (3-5%) in myelodysplastic syndrome, chronic myelomonocytic leukemia, and other atypical chronic myeloid disorders. A small percentage of JAK2 mutation positive patients (3.3%) contain other non-V617F mutations within exons 12 to 15. In particular, mutations in exon 12 of JAK2 have been described in approximately 3% of patients with PV. JAK2 allele burden correlates with clinical phenotype, with low levels of mutant allele characterized by thrombocytosis, intermediate levels with erythrocytosis, and high mutant allele burden correlating with enhanced myelopoiesis of the BM, leukocytosis, increasing spleen size, and circulating CD34-positive cells.    The CALR (Calreticulin) gene encodes for  a multifunctional calcium-binding protein involved in many cellular activities such as growth, proliferation, adhesion, and programmed cell death. Among patients with JAK2 negative MPNs, CALR are found in a pproximately 70% of patients with JAK2-negative essential thrombocythemia (ET) and 60-88% of patients with JAK2-negative primary myelofibrosis(PMF). Only a minority of patients (approximately 8%) with myelodysplasia have mutations in the CALR gene. CALR mutations are rarely detected in patients with de novo acute myeloid leukemia, chronic myelogenous leukemia, lymphoid leukemia, or solid tumors. CALR mutations are not detected in polycythemia and generally appear to be mutually exclusive with JAK2 mutations and MPL mutations. The majority of mutational changes involve a variety of insertion deletion mutations in exon 9 of the calreticulin gene: approximately 53% of all CALR mutations are a 52 bp deletion (type-1) while the second most prevalent mutation (approximately 32%) contains a 5 bp insertion (type-2). Other mutations (non-type 1 or type 2) are seen in a small minority of cases. CALR mutations in PMF tend to be with a favorable prognosis compared to JAK2 V617F [employer], whereas primary myelofibrosis negative for CALR, JAK2 V617F and MPL mutations (so-called triple negative) is associated with a poor prognosis and shorter survival.    The MPL (myeloproliferative leukemia virus oncogene) gene encodes the thrombopoietin receptor which regulates hematopoiesis and megakaryopoiesis. Activating MPL mutations are associated with a subset of myeloproliferative neoplasms and acute megakaryoblastic leukemia. MPL W515 mutations are present in approximately 5-8% of patients with primary myelofibrosis (PMF) and 1-4% of patients with essential thrombocythemia (ET). The S505 mutation is detected in patients with hereditary thrombocythemia.    Limitations    This assay has a sensitivity of  approximately 1% VAF for JAK2 V617F, 2.5% VAF for other mutations in JAK2 exons 12 to 15, CALR mutations, and MPL mutations. Deletions in JAK2 up to 6 bp and insertions up to 34 bp have been detected in validation studies. Deletions in CALR up to 70 bp and i nsertions up to 12 bp have been detected in validation studies.    Method based next generation sequencing.     Comment: Comment [employer]plicon    References Comment     Comment: (NOTE) Alghasham N, Alnouri Y, Abalkhail H, Collier RAMAN. Detection of mutations in JAK2 exons 12-15 by [employer] sequencing. Int JINNY  Lab Hematol. 2016 Feb;38(1):34-41. doi: 10.1111/ijlh.87574. Epub 2015 Sep 11. PMID: 73638915. Glorine ISLE, Cheryll LABOR, Hasserjian R, Omega PARAS, Borowitz MJ, Ladora Campanile MM, [place] CD, Cazzola M, Vardiman JW. The 2016 revision to the World Health Organization classification of myeloid neoplasms and acute leukemia. Blood. 2016 May 19;127(20):2391-405. doi: 10.1182/blood-2016-03-643544. Epub 2016 Apr 11. PMID: 72930745. Honor LELON Glennie VEAR Laurita OLEGARIO [facility], Zhang ZJ, Brodnax S, Albitar M. Mutation profile of JAK2 transcripts in patients with chronic myeloproliferative neoplasias. J Mol Diagn. 2009 Jan;11(1):49-53.doi: 10.2353/jmoldx.2009.080114. Epub 2008 Dec 12. PMID: 80925404; PMCID: EFR7392434. NCCN Clinical Practice Guidelines in Oncology (NCCN Guidelines) Myeloproliferative Neoplasms Version 3.2022 - May 29, 2021. Swerdlow SH, Programmer, multimedia. WHO classif ication of Tumours of Haematopoietic and Lymphoid Tissues. 4th edn. Epifanio, [place]: Geologist, engineering for [employer] on [employer]; 2017. Tefferi A. Primary myelofibrosis: 2021 update on diagnosis, risk-stratification and management. Am J Hematol. 2021 Jan;96(1):145-162. doi: 10.1002/ajh.26050. Epub 2020 Dec 2. PMID: 66802950. Vainchenker W, Kralovics R. Genetic basis and molecular pathophysiology of classical myeloproliferative neoplasms. Blood. 2017 Feb 9;129(6):667-679. doi:  10.1182/blood-2016-10-695940. Epub 2016 Dec 27. PMID: 71971970.    Director Review Comment     Comment: (NOTE) Technical Component performed at [employer] [facility] Professional Component performed by: Rosario Flatten, PhD, [facility] Director, Molecular Oncology [facility] [place], [address] [place] [address] [phone] Performed At: [facility] [address] [place], [place] [id number] Loran Gales MDPhD Ey:[id number] Performed At: [facility] [address] [place], [place] [id number] Loran Gales MDPhD Ey:[phone]   CALR +MPL + E12-E15 (reflexed)     Status: None   Collection Time: 04/25/24  2:27 PM  Result Value Ref Range   CALR Result Comment     Comment: (NOTE) NEGATIVE No insertions or deletions were detected within the analyzed region of the calreticulin (CALR) gene. A negative result does not entirely exclude the possibility of a clonal population carrying CALR gene mutations that are not covered by this assay. Results should be interpreted in conjunction with clinical and laboratory findings for the most accurate interpretation.    MPL Result Comment     Comment: (NOTE) NEGATIVE No MPL mutation was identified in the provided specimen of this individual. Results should be interpreted in conjunction with clinical and other laboratory findings for the most accurate interpretation.    E12-15 Result Comment     Comment: (NOTE) NEGATIVE    JAK2 mutations were not detected in exons 12, 13, 14 and 15. The G to T nucleotide change encoding the V617F mutation was not detected. This result does not rule out the presence of JAK2 mutation at a level below the detection sensitivity of this assay, the presence of other mutations outside the analyzed region of the JAK2 gene, or the presence of a myeloproliferative or other neoplasm. Result must be correlated with other clinical data for the most accurate diagnosis. Performed At: [facility] [address] [place], [place] [id number] Loran Gales MDPhD Ey:[phone]   C-reactive protein     Status: None   Collection Time: 04/25/24  2:28 PM  Result Value Ref Range   CRP 0.7 <1.0 mg/dL    Comment: Performed at [facility], [address], [place], [place] [address]  Reticulocytes     Status: None   Collection Time: 04/25/24  2:28 PM  Result Value Ref Range   Retic Ct Pct 1.3 0.4 - 3.1 %   RBC. 4.73 3.87 - 5.11 MIL/uL  Retic Count, Absolute 59.6 19.0 - 186.0 K/uL   Immature Retic Fract 10.0 2.3 - 15.9 %    Comment: Performed at [facility], [address], [place], [place] [address]  Folate     Status: None   Collection Time: 04/25/24  2:28 PM  Result Value Ref Range   Folate 29.5 >5.9 ng/mL    Comment: RESULTS CONFIRMED BY MANUAL DILUTION Performed at [facility], [address] [place], [place] [address]   Vitamin B12     Status: Abnormal   Collection Time: 04/25/24  2:28 PM  Result Value Ref Range   Vitamin B-12 1,356 (H) 180 - 914 pg/mL    Comment: (NOTE) This assay is not validated for testing neonatal or myeloproliferative syndrome specimens for Vitamin B12 levels. Performed at [facility], [address] [place], [place] [address]   Ferritin     Status: None   Collection Time: 04/25/24  2:28 PM  Result Value Ref Range   Ferritin 30 11 - 307 ng/mL    Comment: Performed at [facility], [address], [place], [place] [address]  Iron  and TIBC [facility]/AP/ASH/[place] ONLY)     Status: None   Collection Time: 04/25/24  2:28 PM  Result Value Ref Range   Iron  84 28 - 170 ug/dL   TIBC 567 749 - 549 ug/dL   Saturation Ratios 19 10.4 - 31.8 %   UIBC 348 ug/dL    Comment: Performed at [facility], [address], [place], [place] [address]  TSH     Status: Abnormal   Collection Time: 05/29/24  8:55 AM  Result Value Ref Range   TSH 0.155 (L) 0.450 - 4.500 uIU/mL  T4, free     Status: Abnormal   Collection Time: 05/29/24  8:55 AM  Result  Value Ref Range   Free T4 1.95 (H) 0.82 - 1.77 ng/dL   Ultrasound of the thyroid  on June 27, 2018 showed right lobe measuring 4.9 x 1.9 x 2.1 cm with 1 dominant mixed cystic and solid nodule measuring 2.2 x 1.5 x 1.8 cm.  Another nodule in the superior right lobe measuring 1.9 x 0.9 x 1.2 cm. Left lobe heterogeneous measuring 4.7 x 3.2 x 2.3 cm with dominant mixed cystic and solid nodule in the central left lobe measuring 2.6 x 2.4 x 2.3 cm.  Separate mixed cystic and solid nodule in the superior left lobe measuring 1.7 x 1.3 x 1.4 cm. Impression:  innumerable thyroid  nodules, increasing probability of benignity.  2 suspicious right thyroid  nodule measuring 1.9 x 0.9 x 1.2 cm in the superior right lobe.  Ultrasound-guided FNA is suggested.  More suspicious left thyroid  nodule in the superior left lobe measuring 2.6 x 2.4 x 2.3 cm.  Ultrasound-guided FNA suggested.   Fine-needle aspiration on November 30, 2018 revealed atypia of undetermined significance,  afirma testing-reported benign, with malignancy risk less than 4%.     Thyroid  uptake and scan on September 04, 2019 showed enlarged thyroid  gland with several focal areas of increased uptake, likely representing nodules.  No areas of photopenia.  24-hour uptake 35%.     US  Thyroid  11/15/2018 Narrative & Impression  CLINICAL DATA:  Multinodular goiter   EXAM: THYROID  ULTRASOUND   TECHNIQUE: Ultrasound examination of the thyroid  gland and adjacent soft tissues was performed.   COMPARISON:  06/27/2018 by report only from [facility]   FINDINGS: Parenchymal Echotexture: Moderately heterogenous   Isthmus: 0.3 cm thickness, stable   Right lobe:  5.6 x 2.3 x 2 cm, previously 4.9 x 1.9 x 2.1   Left lobe: 6.1 x 3.3 x 2.3 cm, previously 4.7 x 3.2 x 2.3   _________________________________________________________   Estimated total number of nodules >/= 1 cm: 6-10   Number of spongiform nodules >/=  2 cm not described below  (TR1): 0   Number of mixed cystic and solid nodules >/= 1.5 cm not described below (TR2): 0   _________________________________________________________   Nodule # 1:   Location: Right; Superior   Maximum size: 1.4 cm; Other 2 dimensions: 1.1 x 1 cm   Composition: mixed cystic and solid (1)   Echogenicity: hypoechoic (2)   Shape: not taller-than-wide (0)   Margins: ill-defined (0)   Echogenic foci: none (0)   ACR TI-RADS total points: 3.   ACR TI-RADS risk category: TR3 (3 points).   ACR TI-RADS recommendations:   Given size (<1.4 cm) and appearance, this nodule does NOT meet TI-RADS criteria for biopsy or dedicated follow-up.   _________________________________________________________   Nodule # 2: 2.7 x 1.9 x 2 cm spongiform nodule, mid right; This nodule does NOT meet TI-RADS criteria for biopsy or dedicated follow-up.   Nodule # 3: 1.5 x 0.9 x 1.2 cm spongiform nodule, inferior right; This nodule does NOT meet TI-RADS criteria for biopsy or dedicated follow-up.   Nodule # 4:   Location: Left; Superior   Maximum size: 2 cm; Other 2 dimensions: 1.7 x 1.7 cm   Composition: mixed cystic and solid (1)   Echogenicity: hypoechoic (2)   Shape: not taller-than-wide (0)   Margins: smooth (0)   Echogenic foci: none (0)   ACR TI-RADS total points: 3.   ACR TI-RADS risk category: TR3 (3 points).   ACR TI-RADS recommendations:   *Given size (>/= 1.5 - 2.4 cm) and appearance, a follow-up ultrasound in 1 year should be considered based on TI-RADS criteria.   _________________________________________________________   Nodule # 5:   Location: Left; Mid   Maximum size: 3 cm; Other 2 dimensions: 2.8 x 1.9 cm   Composition: mixed cystic and solid (1)   Echogenicity: isoechoic (1)   Shape: not taller-than-wide (0)   Margins: smooth (0)   Echogenic foci: none (0)   ACR TI-RADS total points: 2.   ACR TI-RADS risk category: TR2 (2 points).   ACR  TI-RADS recommendations:   This nodule does NOT meet TI-RADS criteria for biopsy or dedicated follow-up.   _________________________________________________________   Nodule # 6:   Location: Left; Inferior   Maximum size: 3.2 cm; Other 2 dimensions: 2.7 x 1.9 cm   Composition: mixed cystic and solid (1)   Echogenicity: isoechoic (1)   Shape: not taller-than-wide (0)   Margins: smooth (0)   Echogenic foci: none (0)   ACR TI-RADS total points: 2.   ACR TI-RADS risk category: TR2 (2 points).   ACR TI-RADS recommendations:   This nodule does NOT meet TI-RADS criteria for biopsy or dedicated follow-up.   _________________________________________________________   Nodule # 7:   Location: Left; Inferior posterior   Maximum size: 3 cm; Other 2 dimensions: 2.9 x 2.3 cm   Composition: solid/almost completely solid (2)   Echogenicity: isoechoic (1)   Shape: taller-than-wide (3)   Margins: ill-defined (0)   Echogenic foci: none (0)   ACR TI-RADS total points: 6.   ACR TI-RADS risk category: TR4 (4-6 points).   ACR TI-RADS recommendations:   **Given size (>/= 1.5 cm) and appearance, fine needle aspiration of this  moderately suspicious nodule should be considered based on TI-RADS criteria.   IMPRESSION: 1. Thyromegaly with bilateral nodules. 2. Recommend FNA biopsy of moderately suspicious 3 cm inferior posterior nodule #7; This was not described on the prior study. 3. Recommend annual/biennial ultrasound follow-up of additional lesion as above, until stability x5 years confirmed.   -------------------------------------------------------------------------------------------------------------------------------  FOLLOW UP THYROID  US  FROM 12/02/20 CLINICAL DATA:  Hypothyroidism. Left inferior thyroid  nodule biopsy on 11/30/2018.   EXAM: THYROID  ULTRASOUND   TECHNIQUE: Ultrasound examination of the thyroid  gland and adjacent soft tissues was performed.    COMPARISON:  11/15/2018   FINDINGS: Parenchymal Echotexture: Moderately heterogenous   Isthmus: 0.2 cm, previously 0.3 cm   Right lobe: 4.9 x 1.7 x 1.6 cm, previously 5.6 x 2.3 x 2.0 cm   Left lobe: 5.1 x 2.2 x 1.7 cm, previously 6.1 x 2.8 x 2.3 cm   _________________________________________________________   Estimated total number of nodules >/= 1 cm: 5   Number of spongiform nodules >/=  2 cm not described below (TR1): 0   Number of mixed cystic and solid nodules >/= 1.5 cm not described below (TR2): 0   _________________________________________________________   Again noted are multiple right thyroid  nodules. The right thyroid  nodules are predominantly cystic or spongiform.   Nodule 2 in the right mid thyroid  lobe has markedly decreased in size. This is a spongiform or mixed cystic and solid nodule that measures 1.3 x 0.9 x 0.9 cm and previously measured 2.7 x 1.9 x 2.0 cm.   Nodule 3 in the right inferior thyroid  is mixed cystic and solid nodule that measures roughly 1.5 x 1.0 x 1.2 cm and previously measured 1.3 x 1.2 x 0.9 cm.   Multiple left thyroid  nodules that are predominantly mixed cystic and solid nodules.   Nodule 5 in the left mid thyroid  lobe measures 1.3 x 0.5 x 0.5 cm and previously measured 2.8 x 1.9 x 3.0 cm. This is a mixed cystic and solid nodule and the solid portion is isoechoic.   Nodule 6 is a mixed cystic and solid nodule in the left inferior thyroid  lobe measures 1.6 x 0.9 x 1.5 cm and previously measured 2.7 x 1.9 x 3.2 cm.   Nodule 7 in the left inferior thyroid  lobe represents the previously biopsied nodule. This nodule is slightly hypoechoic and has scattered cystic areas. This nodule measures 2.1 x 2.0 x 1.5 cm and previously measured 3.0 x 2.9 x 2.3 cm.   IMPRESSION: 1. Multinodular goiter. 2. Most of the nodules are mixed cystic and solid composition. Many of the nodules have decreased in size as described. 3. No new suspicious  thyroid  nodules.   The above is in keeping with the ACR TI-RADS recommendations - J Am Coll Radiol 2017;14:587-595.     Electronically Signed   By: Juliene Balder M.D.   On: 12/02/2020 14:20   Latest Reference Range & Units 04/29/23 12:54 07/30/23 14:00 07/30/23 14:01 10/07/23 10:27 12/01/23 11:58 05/29/24 08:55  TSH 0.450 - 4.500 uIU/mL 0.327 (L) 0.620  0.431 (L) 0.555 0.155 (L)  T4,Free(Direct) 0.82 - 1.77 ng/dL 8.75 (H)  8.99  9.07 8.04 (H)  Thyroxine (T4) 4.5 - 12.0 ug/dL    8.5    Free Thyroxine Index 1.2 - 4.9     2.2    T3 Uptake Ratio 24 - 39 %    26    (L): Data is abnormally low (H): Data is abnormally high  Assessment & Plan:   1. RAI -  induced hypothyroidism -She status post I-131 thyroid  ablation on September 29, 2019.    -Her previsit TFTs are consistent with slight over-replacement.  She is advised to lower her dose of Levothyroxine  to 50 mcg po daily before breakfast.   - We discussed about the correct intake of her thyroid  hormone, on empty stomach at fasting, with water, separated by at least 30 minutes from breakfast and other medications,  and separated by more than 4 hours from calcium, iron , multivitamins, acid reflux medications (PPIs). -Patient is made aware of the fact that thyroid  hormone replacement is needed for life, dose to be adjusted by periodic monitoring of thyroid  function tests.  2.  Multinodular goiter -Her fine-needle aspiration was significant for atypia of undetermined significance, a sample was sent for afirma testing-with grossly benign finding with malignancy is less than 4%.  She will not need surgical intervention at this time.    -Her thyroid  US  from 10/2018 shows multiple nodules which recommended follow up with Us  in 1 year. Her repeat thyroid  US  from 11/2020 shows that all nodules have decreased in size and there are no new nodules.  No additional surveillance recommended at this time.    - I advised her  to maintain close follow up with  [facility], [place] HERO, FNP for primary care needs.      I spent  25  minutes in the care of the patient today including review of labs from Thyroid  Function, CMP, and other relevant labs ; imaging/biopsy records (current and previous including abstractions from other facilities); face-to-face time discussing  her lab results and symptoms, medications doses, her options of short and long term treatment based on the latest standards of care / guidelines;   and documenting the encounter.  Sabrina Blase  participated in the discussions, expressed understanding, and voiced agreement with the above plans.  All questions were answered to her satisfaction. she is encouraged to contact clinic should she have any questions or concerns prior to her return visit.  Follow up plan: Return in about 3 months (around 09/05/2024) for Thyroid  follow up, Previsit labs.   Benton Rio, [facility] [address] [place], [place] [address] Phone: [phone] Fax: [phone]   06/05/2024, 10:59 AM

## 2024-06-07 ENCOUNTER — Inpatient Hospital Stay: Attending: Oncology

## 2024-06-07 VITALS — BP 131/63 | HR 76 | Temp 96.7°F | Resp 18

## 2024-06-07 DIAGNOSIS — D508 Other iron deficiency anemias: Secondary | ICD-10-CM

## 2024-06-07 DIAGNOSIS — D75839 Thrombocytosis, unspecified: Secondary | ICD-10-CM | POA: Diagnosis not present

## 2024-06-07 DIAGNOSIS — D649 Anemia, unspecified: Secondary | ICD-10-CM | POA: Diagnosis not present

## 2024-06-07 MED ORDER — SODIUM CHLORIDE 0.9 % IV SOLN
INTRAVENOUS | Status: DC
Start: 2024-06-07 — End: 2024-06-07

## 2024-06-07 MED ORDER — SODIUM CHLORIDE 0.9 % IV SOLN
1000.0000 mg | Freq: Once | INTRAVENOUS | Status: AC
  Administered 2024-06-07: 1000 mg via INTRAVENOUS
  Filled 2024-06-07: qty 10

## 2024-06-07 MED ORDER — FAMOTIDINE IN NACL 20-0.9 MG/50ML-% IV SOLN
20.0000 mg | Freq: Once | INTRAVENOUS | Status: AC
  Administered 2024-06-07: 20 mg via INTRAVENOUS
  Filled 2024-06-07: qty 50

## 2024-06-07 MED ORDER — ACETAMINOPHEN 325 MG PO TABS
650.0000 mg | ORAL_TABLET | Freq: Once | ORAL | Status: AC
  Administered 2024-06-07: 650 mg via ORAL
  Filled 2024-06-07: qty 2

## 2024-06-07 MED ORDER — CETIRIZINE HCL 10 MG/ML IV SOLN
10.0000 mg | Freq: Once | INTRAVENOUS | Status: AC
  Administered 2024-06-07: 10 mg via INTRAVENOUS
  Filled 2024-06-07: qty 1

## 2024-06-07 NOTE — Progress Notes (Signed)
 Patient tolerated iron infusion with no complaints voiced.  Peripheral IV site clean and dry with good blood return noted before and after infusion.  Band aid applied.  VSS with discharge and left in satisfactory condition with no s/s of distress noted.

## 2024-06-07 NOTE — Patient Instructions (Signed)
 [facility] - A DEPT OF [facility]  Discharge Instructions: Thank you for choosing [facility] to provide your oncology and hematology care.  If you have a lab appointment with the Cancer Center - please note that after April 8th, 2024, all labs will be drawn in the cancer center.  You do not have to check in or register with the main entrance as you have in the past but will complete your check-in in the cancer center.  Wear comfortable clothing and clothing appropriate for easy access to any Portacath or PICC line.   We strive to give you quality time with your provider. You may need to reschedule your appointment if you arrive late (15 or more minutes).  Arriving late affects you and other patients whose appointments are after yours.  Also, if you miss three or more appointments without notifying the office, you may be dismissed from the clinic at the provider's discretion.      For prescription refill requests, have your pharmacy contact our office and allow 72 hours for refills to be completed.    Today you received the following Monoferric , return as scheduled.   To help prevent nausea and vomiting after your treatment, we encourage you to take your nausea medication as directed.  BELOW ARE SYMPTOMS THAT SHOULD BE REPORTED IMMEDIATELY: *FEVER GREATER THAN 100.4 F (38 C) OR HIGHER *CHILLS OR SWEATING *NAUSEA AND VOMITING THAT IS NOT CONTROLLED WITH YOUR NAUSEA MEDICATION *UNUSUAL SHORTNESS OF BREATH *UNUSUAL BRUISING OR BLEEDING *URINARY PROBLEMS (pain or burning when urinating, or frequent urination) *BOWEL PROBLEMS (unusual diarrhea, constipation, pain near the anus) TENDERNESS IN MOUTH AND THROAT WITH OR WITHOUT PRESENCE OF ULCERS (sore throat, sores in mouth, or a toothache) UNUSUAL RASH, SWELLING OR PAIN  UNUSUAL VAGINAL DISCHARGE OR ITCHING   Items with * indicate a potential emergency and should be followed up as soon as possible  or go to the Emergency Department if any problems should occur.  Please show the CHEMOTHERAPY ALERT CARD or IMMUNOTHERAPY ALERT CARD at check-in to the Emergency Department and triage nurse.  Should you have questions after your visit or need to cancel or reschedule your appointment, please contact [facility] [place] - A DEPT OF [facility] [phone]  and follow the prompts.  Office hours are 8:00 a.m. to 4:30 p.m. Monday - Friday. Please note that voicemails left after 4:00 p.m. may not be returned until the following business day.  We are closed weekends and major holidays. You have access to a nurse at all times for urgent questions. Please call the main number to the clinic [phone] and follow the prompts.  For any non-urgent questions, you may also contact your provider using MyChart. We now offer e-Visits for anyone 68 and older to request care online for non-urgent symptoms. For details visit [email or web address].   Also download the MyChart app! Go to the app store, search MyChart, open the app, select [facility], and log in with your MyChart username and password.

## 2024-06-07 NOTE — Progress Notes (Signed)
 Orders received to add as premedication to Monoferric :  Pepcid  20mg  IVPB x 1 Cetirizine  10 mg IVP x 1 Tylenol  650mg  po x 1  V.O. Pleasant Barefoot, PA-C/Tashi Andujo Joshua, PharmD

## 2024-06-07 NOTE — Progress Notes (Signed)
   06/07/24 1300  Spiritual Encounters  Type of Visit Initial  Conversation partners present during encounter Nurse  Referral source Nurse (RN/NT/LPN)  Reason for visit Urgent spiritual support  OnCall Visit No  Spiritual Framework  Presenting Themes Caregiving needs;Coping tools;Rituals and practive  Interventions  Spiritual Care Interventions Made Established relationship of care and support;Compassionate presence;Encouragement;Prayer   Chaplain was asked by the RN to visit Pt who was here for Iron  infusion. Today is her first even transfusion and she was displaying a little anxiety around the unknown (she volunteered the name and number of an emergency contact without being asked to provide one).  I enter Andrea Shannon's room and she immediately engaged me in conversation.  She initially thought I was a doctor and began sharing her medical history.  I introduced myself as the chaplain and she was excited to talk with me.  We spoke for a few minutes about her concerns about the infusion, and then as the RN entered the room I offered prayer.  I left Pt to receive treatment and she was much more relaxed than she had previously been.  Maude Roll, MDiv  Chaplain, [facility] [email or web address] [phone]

## 2024-06-09 ENCOUNTER — Ambulatory Visit: Admitting: Physician Assistant

## 2024-06-28 DIAGNOSIS — M5416 Radiculopathy, lumbar region: Secondary | ICD-10-CM | POA: Diagnosis not present

## 2024-07-07 ENCOUNTER — Other Ambulatory Visit: Payer: Self-pay | Admitting: Nurse Practitioner

## 2024-07-07 DIAGNOSIS — E0520 Thyrotoxicosis with toxic multinodular goiter without thyrotoxic crisis or storm: Secondary | ICD-10-CM

## 2024-07-14 DIAGNOSIS — H2512 Age-related nuclear cataract, left eye: Secondary | ICD-10-CM | POA: Diagnosis not present

## 2024-07-14 DIAGNOSIS — H401121 Primary open-angle glaucoma, left eye, mild stage: Secondary | ICD-10-CM | POA: Diagnosis not present

## 2024-07-14 DIAGNOSIS — H5712 Ocular pain, left eye: Secondary | ICD-10-CM | POA: Diagnosis not present

## 2024-07-20 ENCOUNTER — Encounter: Payer: Self-pay | Admitting: Family Medicine

## 2024-07-20 ENCOUNTER — Ambulatory Visit (INDEPENDENT_AMBULATORY_CARE_PROVIDER_SITE_OTHER): Admitting: Family Medicine

## 2024-07-20 ENCOUNTER — Ambulatory Visit (INDEPENDENT_AMBULATORY_CARE_PROVIDER_SITE_OTHER)

## 2024-07-20 ENCOUNTER — Ambulatory Visit: Payer: Self-pay | Admitting: Family Medicine

## 2024-07-20 VITALS — BP 112/65 | HR 91 | Temp 97.3°F | Ht 63.0 in | Wt 140.4 lb

## 2024-07-20 DIAGNOSIS — Z1159 Encounter for screening for other viral diseases: Secondary | ICD-10-CM

## 2024-07-20 DIAGNOSIS — I152 Hypertension secondary to endocrine disorders: Secondary | ICD-10-CM

## 2024-07-20 DIAGNOSIS — Z7984 Long term (current) use of oral hypoglycemic drugs: Secondary | ICD-10-CM

## 2024-07-20 DIAGNOSIS — G8929 Other chronic pain: Secondary | ICD-10-CM

## 2024-07-20 DIAGNOSIS — E559 Vitamin D deficiency, unspecified: Secondary | ICD-10-CM | POA: Diagnosis not present

## 2024-07-20 DIAGNOSIS — H401120 Primary open-angle glaucoma, left eye, stage unspecified: Secondary | ICD-10-CM | POA: Insufficient documentation

## 2024-07-20 DIAGNOSIS — D500 Iron deficiency anemia secondary to blood loss (chronic): Secondary | ICD-10-CM

## 2024-07-20 DIAGNOSIS — M2550 Pain in unspecified joint: Secondary | ICD-10-CM

## 2024-07-20 DIAGNOSIS — Z780 Asymptomatic menopausal state: Secondary | ICD-10-CM | POA: Diagnosis not present

## 2024-07-20 DIAGNOSIS — R202 Paresthesia of skin: Secondary | ICD-10-CM

## 2024-07-20 DIAGNOSIS — K219 Gastro-esophageal reflux disease without esophagitis: Secondary | ICD-10-CM

## 2024-07-20 DIAGNOSIS — E039 Hypothyroidism, unspecified: Secondary | ICD-10-CM

## 2024-07-20 DIAGNOSIS — H401110 Primary open-angle glaucoma, right eye, stage unspecified: Secondary | ICD-10-CM | POA: Insufficient documentation

## 2024-07-20 DIAGNOSIS — E118 Type 2 diabetes mellitus with unspecified complications: Secondary | ICD-10-CM

## 2024-07-20 DIAGNOSIS — H2510 Age-related nuclear cataract, unspecified eye: Secondary | ICD-10-CM | POA: Insufficient documentation

## 2024-07-20 DIAGNOSIS — E1159 Type 2 diabetes mellitus with other circulatory complications: Secondary | ICD-10-CM

## 2024-07-20 LAB — BAYER DCA HB A1C WAIVED: HB A1C (BAYER DCA - WAIVED): 6 % — ABNORMAL HIGH (ref 4.8–5.6)

## 2024-07-20 MED ORDER — SUCRALFATE 1 G PO TABS: 1.0000 g | ORAL_TABLET | Freq: Two times a day (BID) | ORAL | 1 refills | Status: AC

## 2024-07-20 MED ORDER — METFORMIN HCL 500 MG PO TABS: 500.0000 mg | ORAL_TABLET | Freq: Two times a day (BID) | ORAL | 1 refills | Status: AC

## 2024-07-20 MED ORDER — DICLOFENAC SODIUM 75 MG PO TBEC: 75.0000 mg | DELAYED_RELEASE_TABLET | Freq: Two times a day (BID) | ORAL | 1 refills | Status: AC

## 2024-07-20 NOTE — Progress Notes (Signed)
 Subjective:  Patient ID: Andrea Shannon, female    DOB: 1946/06/12, 78 y.o.   MRN: [id number]  Patient Care Team: Severa Rock HERO, FNP as PCP - General (Family Medicine) Boone Ronal Comer SHAUNNA, OD (Optometry) Therisa Benton PARAS, NP as Nurse Practitioner (Nurse Practitioner) Roddie Bring, DPM as Consulting Physician (Podiatry) Dina Camie FORBES RIGGERS (Neurology) Allen Poe, OD (Optometry)   Chief Complaint:  Medical Management of Chronic Issues   HPI: Andrea Shannon is a 78 y.o. female presenting on 07/20/2024 for Medical Management of Chronic Issues   She underwent a diabetic eye exam four months ago but did not receive the results.  She is currently taking magnesium supplements daily and experiences occasional leg and foot cramps, which are alleviated by magnesium. She wants to take breaks from the supplement but does not want to stop it completely.  Her neuropathy is described as her feet feeling like 'a block of ice' 24/7. She has tried [employer], a supplement for neuropathy, but has not been consistent with it. Her spine doctor suggested gabapentin  for neuropathy.  She had a recent back injection and reports no pain in her back since then. However, she continues to experience pain in her feet and legs, as well as arthritis-related pain.  She mentions weakness and tiredness in her arm and shoulder after prolonged use. No symptoms at present.   She inquires about the removal of a mole on her head, which is not painful but gets irritated when combing her hair.  She is taking metformin  for diabetes. She is running low on her current supply.  She had cataract surgery last Friday and reports doing well post-operatively, using her prescribed eye drops without any issues.  She maintains an active lifestyle, walking for an hour twice a week, and believes it is beneficial for her health.          Relevant past medical, surgical, family, and social history reviewed and updated  as indicated.  Allergies and medications reviewed and updated. Data reviewed: Chart in Epic.   Past Medical History:  Diagnosis Date   Acid reflux    Arthritis    Diabetes mellitus, type II (HCC)    Hypertension    Hyperthyroidism    Hypothyroidism     Past Surgical History:  Procedure Laterality Date   ABDOMINAL HYSTERECTOMY     CATARACT EXTRACTION Left     Social History   Socioeconomic History   Marital status: Widowed    Spouse name: Not on file   Number of children: 1   Years of education: 12   Highest education level: High school graduate  Occupational History   Not on file  Tobacco Use   Smoking status: Never   Smokeless tobacco: Never  Vaping Use   Vaping status: Never Used  Substance and Sexual Activity   Alcohol  use: Not Currently   Drug use: Never   Sexual activity: Not Currently    Birth control/protection: Surgical  Other Topics Concern   Not on file  Social History Narrative   Right handed   Drinks caffeine coffee   Lives alone   Retired   One son   Social Drivers of Corporate investment banker Strain: Low Risk  (01/11/2024)   Overall Financial Resource Strain (CARDIA)    Difficulty of Paying Living Expenses: Not hard at all  Food Insecurity: No Food Insecurity (04/25/2024)   Hunger Vital Sign    Worried About Running Out of Food in the Last  Year: Never true    Ran Out of Food in the Last Year: Never true  Transportation Needs: No Transportation Needs (04/25/2024)   PRAPARE - Administrator, Civil Service (Medical): No    Lack of Transportation (Non-Medical): No  Physical Activity: Insufficiently Active (01/11/2024)   Exercise Vital Sign    Days of Exercise per Week: 3 days    Minutes of Exercise per Session: 30 min  Stress: No Stress Concern Present (01/11/2024)   [employer] of Occupational Health - Occupational Stress Questionnaire    Feeling of Stress : Not at all  Social Connections: Moderately Integrated (01/11/2024)    Social Connection and Isolation Panel    Frequency of Communication with Friends and Family: More than three times a week    Frequency of Social Gatherings with Friends and Family: Three times a week    Attends Religious Services: More than 4 times per year    Active Member of Clubs or Organizations: Yes    Attends Banker Meetings: More than 4 times per year    Marital Status: Widowed  Intimate Partner Violence: Not At Risk (04/25/2024)   Humiliation, Afraid, Rape, and Kick questionnaire    Fear of Current or Ex-Partner: No    Emotionally Abused: No    Physically Abused: No    Sexually Abused: No    Outpatient Encounter Medications as of 07/20/2024  Medication Sig   aspirin 81 MG chewable tablet Chew by mouth daily.   levothyroxine  (SYNTHROID ) 50 MCG tablet Take 1 tablet (50 mcg total) by mouth daily.   lisinopril -hydrochlorothiazide  (ZESTORETIC ) 20-12.5 MG tablet Take 1 tablet by mouth daily.   meclizine  (ANTIVERT ) 12.5 MG tablet Take 1 tablet (12.5 mg total) by mouth 3 (three) times daily as needed for dizziness.   pantoprazole  (PROTONIX ) 40 MG tablet Take 1 tablet (40 mg total) by mouth daily.   vitamin B-12 (CYANOCOBALAMIN ) 100 MCG tablet Take 100 mcg by mouth daily.   [DISCONTINUED] diclofenac  (VOLTAREN ) 75 MG EC tablet Take 1 tablet (75 mg total) by mouth 2 (two) times daily.   [DISCONTINUED] metFORMIN  (GLUCOPHAGE ) 500 MG tablet Take 1 tablet (500 mg total) by mouth 2 (two) times daily with a meal.   [DISCONTINUED] sucralfate  (CARAFATE ) 1 g tablet Take 1 tablet (1 g total) by mouth 2 (two) times daily.   diclofenac  (VOLTAREN ) 75 MG EC tablet Take 1 tablet (75 mg total) by mouth 2 (two) times daily.   levothyroxine  (SYNTHROID ) 50 MCG tablet Take 1 tablet (50 mcg total) by mouth daily.   metFORMIN  (GLUCOPHAGE ) 500 MG tablet Take 1 tablet (500 mg total) by mouth 2 (two) times daily with a meal.   sucralfate  (CARAFATE ) 1 g tablet Take 1 tablet (1 g total) by mouth 2 (two)  times daily.   No facility-administered encounter medications on file as of 07/20/2024.    Allergies  Allergen Reactions   No Known Allergies     Pertinent ROS per HPI, otherwise unremarkable      Objective:  BP 112/65   Pulse 91   Temp (!) 97.3 F (36.3 C)   Ht 5' 3 (1.6 m)   Wt 140 lb 6.4 oz (63.7 kg)   BMI 24.87 kg/m    Wt Readings from Last 3 Encounters:  07/20/24 140 lb 6.4 oz (63.7 kg)  06/05/24 143 lb 6.4 oz (65 kg)  05/16/24 146 lb 9.7 oz (66.5 kg)    Physical Exam Vitals and nursing note reviewed.  Constitutional:  Appearance: Normal appearance.  HENT:     Head: Normocephalic and atraumatic.     Right Ear: Tympanic membrane, ear canal and external ear normal.     Left Ear: Tympanic membrane, ear canal and external ear normal.     Nose: Nose normal.     Mouth/Throat:     Mouth: Mucous membranes are moist.     Pharynx: Oropharynx is clear.  Eyes:     Extraocular Movements: Extraocular movements intact.     Conjunctiva/sclera: Conjunctivae normal.     Pupils: Pupils are equal, round, and reactive to light.  Cardiovascular:     Rate and Rhythm: Normal rate and regular rhythm.     Pulses: Normal pulses.          Dorsalis pedis pulses are 2+ on the right side and 2+ on the left side.       Posterior tibial pulses are 2+ on the right side and 2+ on the left side.     Heart sounds: Normal heart sounds.  Pulmonary:     Effort: Pulmonary effort is normal.     Breath sounds: Normal breath sounds.  Abdominal:     General: Bowel sounds are normal.     Palpations: Abdomen is soft.  Musculoskeletal:        General: Normal range of motion.     Cervical back: Normal range of motion and neck supple.     Right lower leg: No edema.     Left lower leg: No edema.  Feet:     Right foot:     Protective Sensation: 10 sites tested.  10 sites sensed.     Skin integrity: Skin integrity normal.     Toenail Condition: Right toenails are normal.     Left foot:      Protective Sensation:   10 sites sensed.     Skin integrity: Skin integrity normal.     Toenail Condition: Left toenails are normal.  Lymphadenopathy:     Cervical: No cervical adenopathy.  Skin:    General: Skin is warm and dry.     Capillary Refill: Capillary refill takes less than 2 seconds.  Neurological:     General: No focal deficit present.     Mental Status: She is alert and oriented to person, place, and time.  Psychiatric:        Mood and Affect: Mood normal.        Behavior: Behavior normal.        Thought Content: Thought content normal.     Results for orders placed or performed in visit on 04/25/24  Protein electrophoresis, serum   Collection Time: 04/25/24  2:27 PM  Result Value Ref Range   Total Protein ELP 7.1 6.0 - 8.5 g/dL   Albumin ELP 4.3 2.9 - 4.4 g/dL   Joeyj-8-Honalopw 0.2 0.0 - 0.4 g/dL   Joeyj-7-Honalopw 0.5 0.4 - 1.0 g/dL   Beta Globulin 1.2 0.7 - 1.3 g/dL   Gamma Globulin 0.9 0.4 - 1.8 g/dL   M-Spike, % Not Observed Not Observed g/dL   SPE Interp. Comment    Comment Comment    Globulin, Total 2.8 2.2 - 3.9 g/dL   A/G Ratio 1.5 0.7 - 1.7  Kappa/lambda light chains   Collection Time: 04/25/24  2:27 PM  Result Value Ref Range   Kappa free light chain 25.3 (H) 3.3 - 19.4 mg/L   Lambda free light chains 13.5 5.7 - 26.3 mg/L   Kappa, lambda light  chain ratio 1.87 (H) 0.26 - 1.65  Immunofixation electrophoresis   Collection Time: 04/25/24  2:27 PM  Result Value Ref Range   Total Protein ELP 7.0 6.0 - 8.5 g/dL   IgG (Immunoglobin G), Serum 971 586 - 1,602 mg/dL   IgA 826 64 - 577 mg/dL   IgM (Immunoglobulin M), Srm 36 26 - 217 mg/dL   Immunofixation Result, Serum Comment   Sedimentation rate   Collection Time: 04/25/24  2:27 PM  Result Value Ref Range   Sed Rate 6 0 - 30 mm/hr  CBC with Differential   Collection Time: 04/25/24  2:27 PM  Result Value Ref Range   WBC 5.7 4.0 - 10.5 K/uL   RBC 4.79 3.87 - 5.11 MIL/uL   Hemoglobin 11.5 (L) 12.0  - 15.0 g/dL   HCT 61.8 63.9 - 53.9 %   MCV 79.5 (L) 80.0 - 100.0 fL   MCH 24.0 (L) 26.0 - 34.0 pg   MCHC 30.2 30.0 - 36.0 g/dL   RDW 84.0 (H) 88.4 - 84.4 %   Platelets 289 150 - 400 K/uL   nRBC 0.0 0.0 - 0.2 %   Neutrophils Relative % 63 %   Neutro Abs 3.6 1.7 - 7.7 K/uL   Lymphocytes Relative 28 %   Lymphs Abs 1.6 0.7 - 4.0 K/uL   Monocytes Relative 6 %   Monocytes Absolute 0.4 0.1 - 1.0 K/uL   Eosinophils Relative 2 %   Eosinophils Absolute 0.1 0.0 - 0.5 K/uL   Basophils Relative 1 %   Basophils Absolute 0.0 0.0 - 0.1 K/uL   Smear Review PLATELET COUNT CONFIRMED BY SMEAR    Immature Granulocytes 0 %   Abs Immature Granulocytes 0.02 0.00 - 0.07 K/uL   Schistocytes PRESENT    Tear Drop Cells PRESENT    Ovalocytes PRESENT   Copper , serum   Collection Time: 04/25/24  2:27 PM  Result Value Ref Range   Copper  116 80 - 158 ug/dL  Lactate dehydrogenase   Collection Time: 04/25/24  2:27 PM  Result Value Ref Range   LDH 165 98 - 192 U/L  Methylmalonic acid, serum   Collection Time: 04/25/24  2:27 PM  Result Value Ref Range   Methylmalonic Acid, Quantitative 232 0 - 378 nmol/L  JAK2 V617F rfx CALR/MPL/E12-15   Collection Time: 04/25/24  2:27 PM  Result Value Ref Range   Specimen Type Comment:    JAK2 V617F Result Comment    Reflex Comment    V617F Rfx CALR/MPL/E12-15 Bkgd Comment    Method based next generation sequencing.    References Comment    Director Review Comment   CALR +MPL + E12-E15 (reflexed)   Collection Time: 04/25/24  2:27 PM  Result Value Ref Range   CALR Result Comment    MPL Result Comment    E12-15 Result Comment   C-reactive protein   Collection Time: 04/25/24  2:28 PM  Result Value Ref Range   CRP 0.7 <1.0 mg/dL  Reticulocytes   Collection Time: 04/25/24  2:28 PM  Result Value Ref Range   Retic Ct Pct 1.3 0.4 - 3.1 %   RBC. 4.73 3.87 - 5.11 MIL/uL   Retic Count, Absolute 59.6 19.0 - 186.0 K/uL   Immature Retic Fract 10.0 2.3 - 15.9 %  Folate    Collection Time: 04/25/24  2:28 PM  Result Value Ref Range   Folate 29.5 >5.9 ng/mL  Vitamin B12   Collection Time: 04/25/24  2:28 PM  Result Value Ref Range   Vitamin B-12 1,356 (H) 180 - 914 pg/mL  Ferritin   Collection Time: 04/25/24  2:28 PM  Result Value Ref Range   Ferritin 30 11 - 307 ng/mL  Iron  and TIBC ([facility] ONLY)   Collection Time: 04/25/24  2:28 PM  Result Value Ref Range   Iron  84 28 - 170 ug/dL   TIBC 567 749 - 549 ug/dL   Saturation Ratios 19 10.4 - 31.8 %   UIBC 348 ug/dL       Pertinent labs & imaging results that were available during my care of the patient were reviewed by me and considered in my medical decision making.  Assessment & Plan:  Andrea Shannon was seen today for medical management of chronic issues.  Diagnoses and all orders for this visit:  Diabetes mellitus type 2 with complications (HCC) -     Vitamin B12 -     Bayer DCA Hb A1c Waived -     CBC with Differential/Platelet -     CMP14+EGFR -     Lipid panel -     TSH -     T4, Free -     metFORMIN  (GLUCOPHAGE ) 500 MG tablet; Take 1 tablet (500 mg total) by mouth 2 (two) times daily with a meal. -     Microalbumin / creatinine urine ratio  Hypertension associated with diabetes (HCC) -     Vitamin B12 -     Bayer DCA Hb A1c Waived -     CBC with Differential/Platelet -     CMP14+EGFR -     Lipid panel -     TSH -     T4, Free  Gastroesophageal reflux disease without esophagitis -     CBC with Differential/Platelet -     sucralfate  (CARAFATE ) 1 g tablet; Take 1 tablet (1 g total) by mouth 2 (two) times daily.  Chronic pain of multiple joints -     CBC with Differential/Platelet -     CMP14+EGFR -     diclofenac  (VOLTAREN ) 75 MG EC tablet; Take 1 tablet (75 mg total) by mouth 2 (two) times daily.  Paresthesias -     Vitamin B12 -     Bayer DCA Hb A1c Waived -     CBC with Differential/Platelet -     CMP14+EGFR  Iron  deficiency anemia due to chronic blood  loss -     CBC with Differential/Platelet  Need for hepatitis C screening test -     Hepatitis C antibody  Vitamin D  deficiency -     CMP14+EGFR -     VITAMIN D  25 Hydroxy (Vit-D Deficiency, Fractures)  Encounter for osteoporosis screening in asymptomatic postmenopausal patient -     DG [facility] DEXA  Acquired hypothyroidism -     TSH -     T4, Free       Diabetes mellitus, type 2 Diabetes management is ongoing with metformin . Recent adjustment to 50 mg due to previous low levels.  - Called for metformin  refill - Continue follow-up with endocrinologist next month  Peripheral neuropathy of the feet Persistent neuropathy in feet with sensation of coldness. Previous trial of Nervive supplement was not sustained. Gabapentin  suggested by spine doctor for neuropathy management. - Recommended daily Nervive supplement - Will consider gabapentin  for neuropathy management  Pain in leg and foot Intermittent pain in leg and foot, possibly related to neuropathy. Previous back injection provided relief for back pain, but leg and  foot pain persists. - Recommended daily Nervive supplement - Will consider gabapentin  for neuropathy management  Pain and weakness in arm and shoulder Intermittent weakness and tiredness in arm and shoulder. No specific cause identified.  Arthritis Chronic arthritis with occasional pain. Discussed use of prednisone for acute flare-ups, but not for long-term management. - Consider prednisone for acute arthritis flare-ups  History of cataract, status post surgery Status post cataract surgery with good recovery. Follow-up with ophthalmologist scheduled. - Continue follow-up with ophthalmologist  Benign mole of scalp, planned for removal Benign mole on scalp causing irritation when combed. No change in size. - Scheduled mole removal at next visit in three months  Osteoporosis screening (DEXA) Due for osteoporosis screening with DEXA scan. - Ordered DEXA scan  today          Continue all other maintenance medications.  Follow up plan: Return in about 3 months (around 10/20/2024), or if symptoms worsen or fail to improve, for 30 minutes for mole removal and DM.   Continue healthy lifestyle choices, including diet (rich in fruits, vegetables, and lean proteins, and low in salt and simple carbohydrates) and exercise (at least 30 minutes of moderate physical activity daily).  Educational handout given for health maintenance   The above assessment and management plan was discussed with the patient. The patient verbalized understanding of and has agreed to the management plan. Patient is aware to call the clinic if they develop any new symptoms or if symptoms persist or worsen. Patient is aware when to return to the clinic for a follow-up visit. Patient educated on when it is appropriate to go to the emergency department.   Rosaline Bruns, FNP-C [facility] [phone]

## 2024-07-20 NOTE — Patient Instructions (Signed)
Nervive

## 2024-07-21 ENCOUNTER — Other Ambulatory Visit: Payer: Self-pay | Admitting: Family Medicine

## 2024-07-21 DIAGNOSIS — I152 Hypertension secondary to endocrine disorders: Secondary | ICD-10-CM

## 2024-07-21 DIAGNOSIS — Z780 Asymptomatic menopausal state: Secondary | ICD-10-CM | POA: Diagnosis not present

## 2024-07-21 LAB — LIPID PANEL
Chol/HDL Ratio: 2.9 ratio (ref 0.0–4.4)
Cholesterol, Total: 181 mg/dL (ref 100–199)
HDL: 63 mg/dL (ref >39)
LDL Chol Calc (NIH): 77 mg/dL (ref 0–99)
Triglycerides: 254 mg/dL — ABNORMAL HIGH (ref 0–149)
VLDL Cholesterol Cal: 41 mg/dL — ABNORMAL HIGH (ref 5–40)

## 2024-07-21 LAB — CBC WITH DIFFERENTIAL/PLATELET
Basophils Absolute: 0 x10E3/uL (ref 0.0–0.2)
Basos: 1 %
EOS (ABSOLUTE): 0.2 x10E3/uL (ref 0.0–0.4)
Eos: 3 %
Hematocrit: 39.5 % (ref 34.0–46.6)
Hemoglobin: 11.9 g/dL (ref 11.1–15.9)
Immature Grans (Abs): 0 x10E3/uL (ref 0.0–0.1)
Immature Granulocytes: 0 %
Lymphocytes Absolute: 1.6 x10E3/uL (ref 0.7–3.1)
Lymphs: 26 %
MCH: 24.3 pg — ABNORMAL LOW (ref 26.6–33.0)
MCHC: 30.1 g/dL — ABNORMAL LOW (ref 31.5–35.7)
MCV: 81 fL (ref 79–97)
Monocytes Absolute: 0.3 x10E3/uL (ref 0.1–0.9)
Monocytes: 6 %
Neutrophils Absolute: 3.8 x10E3/uL (ref 1.4–7.0)
Neutrophils: 63 %
Platelets: 476 x10E3/uL — ABNORMAL HIGH (ref 150–450)
RBC: 4.9 x10E6/uL (ref 3.77–5.28)
RDW: 17 % — ABNORMAL HIGH (ref 11.7–15.4)
WBC: 5.9 x10E3/uL (ref 3.4–10.8)

## 2024-07-21 LAB — CMP14+EGFR
ALT: 16 IU/L (ref 0–32)
AST: 15 IU/L (ref 0–40)
Albumin: 4.6 g/dL (ref 3.8–4.8)
Alkaline Phosphatase: 65 IU/L (ref 49–135)
BUN/Creatinine Ratio: 18 (ref 12–28)
BUN: 20 mg/dL (ref 8–27)
Bilirubin Total: 0.2 mg/dL (ref 0.0–1.2)
CO2: 23 mmol/L (ref 20–29)
Calcium: 10.2 mg/dL (ref 8.7–10.3)
Chloride: 98 mmol/L (ref 96–106)
Creatinine, Ser: 1.09 mg/dL — ABNORMAL HIGH (ref 0.57–1.00)
Globulin, Total: 2.2 g/dL (ref 1.5–4.5)
Glucose: 77 mg/dL (ref 70–99)
Potassium: 4.2 mmol/L (ref 3.5–5.2)
Sodium: 138 mmol/L (ref 134–144)
Total Protein: 6.8 g/dL (ref 6.0–8.5)
eGFR: 53 mL/min/1.73 — ABNORMAL LOW (ref >59)

## 2024-07-21 LAB — VITAMIN B12: Vitamin B-12: 1976 pg/mL — ABNORMAL HIGH (ref 232–1245)

## 2024-07-21 LAB — MICROALBUMIN / CREATININE URINE RATIO
Creatinine, Urine: 170.7 mg/dL
Microalb/Creat Ratio: 8 mg/g{creat} (ref 0–29)
Microalbumin, Urine: 13.1 ug/mL

## 2024-07-21 LAB — HEPATITIS C ANTIBODY: Hep C Virus Ab: NONREACTIVE

## 2024-07-21 LAB — TSH: TSH: 0.655 u[IU]/mL (ref 0.450–4.500)

## 2024-07-21 LAB — VITAMIN D 25 HYDROXY (VIT D DEFICIENCY, FRACTURES): Vit D, 25-Hydroxy: 53.7 ng/mL (ref 30.0–100.0)

## 2024-07-21 LAB — T4, FREE: Free T4: 1.78 ng/dL — ABNORMAL HIGH (ref 0.82–1.77)

## 2024-07-21 NOTE — Telephone Encounter (Signed)
 Copied from CRM [id number]. Topic: Clinical - Medication Refill >> Jul 21, 2024  8:36 AM Joesph B wrote: Medication: lisinopril -hydrochlorothiazide  (ZESTORETIC ) 20-12.5 MG tablet  Has the patient contacted their pharmacy? No (Agent: If no, request that the patient contact the pharmacy for the refill. If patient does not wish to contact the pharmacy document the reason why and proceed with request.) (Agent: If yes, when and what did the pharmacy advise?)  This is the patient's preferred pharmacy:  [facility] - [place], [place] - [address] [facility] [address] [place] [address] [place] [address] Phone: [phone] Fax: [phone]    Is this the correct pharmacy for this prescription? Yes If no, delete pharmacy and type the correct one.   Has the prescription been filled recently? No  Is the patient out of the medication? No  Has the patient been seen for an appointment in the last year OR does the patient have an upcoming appointment? Yes  Can we respond through MyChart? No  Agent: Please be advised that Rx refills may take up to 3 business days. We ask that you follow-up with your pharmacy.

## 2024-07-24 ENCOUNTER — Telehealth: Payer: Self-pay | Admitting: Family Medicine

## 2024-07-24 NOTE — Telephone Encounter (Signed)
 I reviewed results with pt per result notes and she voiced understanding.

## 2024-07-24 NOTE — Telephone Encounter (Signed)
 Copied from CRM [id number]. Topic: Clinical - Lab/Test Results >> Jul 24, 2024  8:40 AM Graeme ORN wrote: Reason for CRM: Patient called back about lab results. Attempted to read note from provider. Pt would like to speak to someone further about results and what's going on in body. Requesting call back. Thank You

## 2024-08-09 DIAGNOSIS — M5416 Radiculopathy, lumbar region: Secondary | ICD-10-CM | POA: Diagnosis not present

## 2024-08-09 DIAGNOSIS — M4316 Spondylolisthesis, lumbar region: Secondary | ICD-10-CM | POA: Diagnosis not present

## 2024-08-09 DIAGNOSIS — Z6825 Body mass index (BMI) 25.0-25.9, adult: Secondary | ICD-10-CM | POA: Diagnosis not present

## 2024-08-11 DIAGNOSIS — H5711 Ocular pain, right eye: Secondary | ICD-10-CM | POA: Diagnosis not present

## 2024-08-11 DIAGNOSIS — H2511 Age-related nuclear cataract, right eye: Secondary | ICD-10-CM | POA: Diagnosis not present

## 2024-08-11 DIAGNOSIS — H401111 Primary open-angle glaucoma, right eye, mild stage: Secondary | ICD-10-CM | POA: Diagnosis not present

## 2024-08-24 ENCOUNTER — Institutional Professional Consult (permissible substitution): Payer: Medicare Other | Admitting: Psychology

## 2024-08-24 ENCOUNTER — Ambulatory Visit: Payer: Self-pay

## 2024-08-29 ENCOUNTER — Telehealth: Payer: Self-pay | Admitting: Nurse Practitioner

## 2024-08-29 NOTE — Telephone Encounter (Signed)
 Pt did thyroid  labs 07/20/24, will you use those for appt next week?

## 2024-08-29 NOTE — Telephone Encounter (Signed)
 yes

## 2024-08-30 ENCOUNTER — Other Ambulatory Visit

## 2024-08-30 DIAGNOSIS — E890 Postprocedural hypothyroidism: Secondary | ICD-10-CM | POA: Diagnosis not present

## 2024-08-31 LAB — T4, FREE: Free T4: 1.65 ng/dL (ref 0.82–1.77)

## 2024-08-31 LAB — TSH: TSH: 0.69 u[IU]/mL (ref 0.450–4.500)

## 2024-09-04 ENCOUNTER — Encounter: Payer: Self-pay | Admitting: Nurse Practitioner

## 2024-09-04 ENCOUNTER — Ambulatory Visit: Payer: Self-pay

## 2024-09-04 ENCOUNTER — Ambulatory Visit: Admitting: Nurse Practitioner

## 2024-09-04 VITALS — BP 114/62 | HR 87 | Temp 97.0°F | Ht 63.0 in | Wt 145.4 lb

## 2024-09-04 DIAGNOSIS — J0110 Acute frontal sinusitis, unspecified: Secondary | ICD-10-CM | POA: Insufficient documentation

## 2024-09-04 MED ORDER — AZITHROMYCIN 250 MG PO TABS: ORAL_TABLET | ORAL | 0 refills | Status: AC

## 2024-09-04 MED ORDER — AZELASTINE HCL 0.1 % NA SOLN: 1.0000 | Freq: Two times a day (BID) | NASAL | 5 refills | Status: DC

## 2024-09-04 NOTE — Progress Notes (Signed)
 Subjective:  Patient ID: Andrea Shannon, female    DOB: Sep 13, 1946, 78 y.o.   MRN: [id number]  Patient Care Team: Severa Rock HERO, FNP as PCP - General (Family Medicine) Boone Ronal Comer SHAUNNA, OD (Optometry) Therisa Benton PARAS, NP as Nurse Practitioner (Nurse Practitioner) Roddie Bring, DPM as Consulting Physician (Podiatry) Dina Camie FORBES DEVONNA (Neurology) Allen Poe, OD (Optometry)   Chief Complaint:  Nasal Congestion (Symptoms for 2 weeks )   HPI: Andrea Shannon is a 78 y.o. female presenting on 09/04/2024 for Nasal Congestion (Symptoms for 2 weeks )   Discussed the use of AI scribe software for clinical note transcription with the patient, who gave verbal consent to proceed.  History of Present Illness Andrea Shannon is a 78 year old female with allergies and sinus issues who presents with persistent head congestion.  She has been experiencing significant head congestion for the first time, which began approximately two weeks ago. She has a history of allergies and sinus problems. During this period, she has tried various over-the-counter medications including Advair, Cold Plus, sinus and allergy tablets, and Zancan for cold relief, with minimal relief.  Her symptoms include persistent head congestion accompanied by sneezing and clear nasal discharge. No cough is present. She experiences occasional slight headaches localized to her temples, which are transient. She also reports epistaxis when blowing her nose.  She is unsure if she has had a fever, mentioning experiencing 'a lot of hot patches' but not being able to confirm a fever. She keeps her home temperature at 72 degrees to avoid excessive heat, which she finds uncomfortable due to her high blood pressure.  No smoking history is present and she is not aware of any medication allergies.      Relevant past medical, surgical, family, and social history reviewed and updated as indicated.  Allergies and  medications reviewed and updated. Data reviewed: Chart in Epic.   Past Medical History:  Diagnosis Date   Acid reflux    Arthritis    Diabetes mellitus, type II (HCC)    Hypertension    Hyperthyroidism    Hypothyroidism     Past Surgical History:  Procedure Laterality Date   ABDOMINAL HYSTERECTOMY     CATARACT EXTRACTION Left     Social History   Socioeconomic History   Marital status: Widowed    Spouse name: Not on file   Number of children: 1   Years of education: 12   Highest education level: High school graduate  Occupational History   Not on file  Tobacco Use   Smoking status: Never   Smokeless tobacco: Never  Vaping Use   Vaping status: Never Used  Substance and Sexual Activity   Alcohol  use: Not Currently   Drug use: Never   Sexual activity: Not Currently    Birth control/protection: Surgical  Other Topics Concern   Not on file  Social History Narrative   Right handed   Drinks caffeine coffee   Lives alone   Retired   One son   Social Drivers of Corporate Investment Banker Strain: Low Risk  (01/11/2024)   Overall Financial Resource Strain (CARDIA)    Difficulty of Paying Living Expenses: Not hard at all  Food Insecurity: No Food Insecurity (04/25/2024)   Hunger Vital Sign    Worried About Running Out of Food in the Last Year: Never true    Ran Out of Food in the Last Year: Never true  Transportation Needs: No Transportation Needs (  04/25/2024)   PRAPARE - Administrator, Civil Service (Medical): No    Lack of Transportation (Non-Medical): No  Physical Activity: Insufficiently Active (01/11/2024)   Exercise Vital Sign    Days of Exercise per Week: 3 days    Minutes of Exercise per Session: 30 min  Stress: No Stress Concern Present (01/11/2024)   [employer] of Occupational Health - Occupational Stress Questionnaire    Feeling of Stress : Not at all  Social Connections: Moderately Integrated (01/11/2024)   Social Connection and  Isolation Panel    Frequency of Communication with Friends and Family: More than three times a week    Frequency of Social Gatherings with Friends and Family: Three times a week    Attends Religious Services: More than 4 times per year    Active Member of Clubs or Organizations: Yes    Attends Banker Meetings: More than 4 times per year    Marital Status: Widowed  Intimate Partner Violence: Not At Risk (04/25/2024)   Humiliation, Afraid, Rape, and Kick questionnaire    Fear of Current or Ex-Partner: No    Emotionally Abused: No    Physically Abused: No    Sexually Abused: No    Outpatient Encounter Medications as of 09/04/2024  Medication Sig   aspirin 81 MG chewable tablet Chew by mouth daily.   azelastine (ASTELIN) 0.1 % nasal spray Place 1 spray into both nostrils 2 (two) times daily. Use in each nostril as directed   azithromycin (ZITHROMAX) 250 MG tablet Take 2 tablets on day 1, then 1 tablet daily on days 2 through 5   diclofenac  (VOLTAREN ) 75 MG EC tablet Take 1 tablet (75 mg total) by mouth 2 (two) times daily.   levothyroxine  (SYNTHROID ) 50 MCG tablet Take 1 tablet (50 mcg total) by mouth daily.   levothyroxine  (SYNTHROID ) 50 MCG tablet Take 1 tablet (50 mcg total) by mouth daily.   lisinopril -hydrochlorothiazide  (ZESTORETIC ) 20-12.5 MG tablet Take 1 tablet by mouth daily.   meclizine  (ANTIVERT ) 12.5 MG tablet Take 1 tablet (12.5 mg total) by mouth 3 (three) times daily as needed for dizziness.   metFORMIN  (GLUCOPHAGE ) 500 MG tablet Take 1 tablet (500 mg total) by mouth 2 (two) times daily with a meal.   pantoprazole  (PROTONIX ) 40 MG tablet Take 1 tablet (40 mg total) by mouth daily.   sucralfate  (CARAFATE ) 1 g tablet Take 1 tablet (1 g total) by mouth 2 (two) times daily.   vitamin B-12 (CYANOCOBALAMIN ) 100 MCG tablet Take 100 mcg by mouth daily.   No facility-administered encounter medications on file as of 09/04/2024.    Allergies  Allergen Reactions   No  Known Allergies     Pertinent ROS per HPI, otherwise unremarkable      Objective:  BP 114/62   Pulse 87   Temp (!) 97 F (36.1 C) (Temporal)   Ht 5' 3 (1.6 m)   Wt 145 lb 6.4 oz (66 kg)   SpO2 99%   BMI 25.76 kg/m    Wt Readings from Last 3 Encounters:  09/04/24 145 lb 6.4 oz (66 kg)  07/20/24 140 lb 6.4 oz (63.7 kg)  06/05/24 143 lb 6.4 oz (65 kg)    Physical Exam Vitals and nursing note reviewed.  Constitutional:      General: She is not in acute distress. HENT:     Head: Normocephalic and atraumatic.     Right Ear: Tympanic membrane, ear canal and external ear normal.  There is no impacted cerumen.     Left Ear: Tympanic membrane, ear canal and external ear normal. There is no impacted cerumen.     Nose:     Right Turbinates: Swollen.     Left Turbinates: Swollen.     Right Sinus: Frontal sinus tenderness present.     Left Sinus: Frontal sinus tenderness present.     Mouth/Throat:     Mouth: Mucous membranes are moist.  Eyes:     General: No scleral icterus.    Extraocular Movements: Extraocular movements intact.     Conjunctiva/sclera: Conjunctivae normal.     Pupils: Pupils are equal, round, and reactive to light.  Cardiovascular:     Heart sounds: Normal heart sounds.  Pulmonary:     Effort: Pulmonary effort is normal.     Breath sounds: Normal breath sounds.  Musculoskeletal:        General: Normal range of motion.     Right lower leg: No edema.     Left lower leg: No edema.  Skin:    General: Skin is warm and dry.     Findings: No rash.  Neurological:     Mental Status: She is alert and oriented to person, place, and time.  Psychiatric:        Mood and Affect: Mood normal.        Behavior: Behavior normal.        Thought Content: Thought content normal.    Physical Exam HEENT: Mild sinus pressure     Results for orders placed or performed in visit on 07/20/24  Bayer DCA Hb A1c Waived   Collection Time: 07/20/24 11:10 AM  Result Value Ref  Range   HB A1C (BAYER DCA - WAIVED) 6.0 (H) 4.8 - 5.6 %  Vitamin B12   Collection Time: 07/20/24 11:12 AM  Result Value Ref Range   Vitamin B-12 1,976 (H) 232 - 1,245 pg/mL  CBC with Differential/Platelet   Collection Time: 07/20/24 11:12 AM  Result Value Ref Range   WBC 5.9 3.4 - 10.8 x10E3/uL   RBC 4.90 3.77 - 5.28 x10E6/uL   Hemoglobin 11.9 11.1 - 15.9 g/dL   Hematocrit 60.4 65.9 - 46.6 %   MCV 81 79 - 97 fL   MCH 24.3 (L) 26.6 - 33.0 pg   MCHC 30.1 (L) 31.5 - 35.7 g/dL   RDW 82.9 (H) 88.2 - 84.5 %   Platelets 476 (H) 150 - 450 x10E3/uL   Neutrophils 63 Not Estab. %   Lymphs 26 Not Estab. %   Monocytes 6 Not Estab. %   Eos 3 Not Estab. %   Basos 1 Not Estab. %   Neutrophils Absolute 3.8 1.4 - 7.0 x10E3/uL   Lymphocytes Absolute 1.6 0.7 - 3.1 x10E3/uL   Monocytes Absolute 0.3 0.1 - 0.9 x10E3/uL   EOS (ABSOLUTE) 0.2 0.0 - 0.4 x10E3/uL   Basophils Absolute 0.0 0.0 - 0.2 x10E3/uL   Immature Granulocytes 0 Not Estab. %   Immature Grans (Abs) 0.0 0.0 - 0.1 x10E3/uL  CMP14+EGFR   Collection Time: 07/20/24 11:12 AM  Result Value Ref Range   Glucose 77 70 - 99 mg/dL   BUN 20 8 - 27 mg/dL   Creatinine, Ser 8.90 (H) 0.57 - 1.00 mg/dL   eGFR 53 (L) >40 fO/fpw/8.26   BUN/Creatinine Ratio 18 12 - 28   Sodium 138 134 - 144 mmol/L   Potassium 4.2 3.5 - 5.2 mmol/L   Chloride 98 96 - 106 mmol/L  CO2 23 20 - 29 mmol/L   Calcium 10.2 8.7 - 10.3 mg/dL   Total Protein 6.8 6.0 - 8.5 g/dL   Albumin 4.6 3.8 - 4.8 g/dL   Globulin, Total 2.2 1.5 - 4.5 g/dL   Bilirubin Total 0.2 0.0 - 1.2 mg/dL   Alkaline Phosphatase 65 49 - 135 IU/L   AST 15 0 - 40 IU/L   ALT 16 0 - 32 IU/L  Lipid panel   Collection Time: 07/20/24 11:12 AM  Result Value Ref Range   Cholesterol, Total 181 100 - 199 mg/dL   Triglycerides 745 (H) 0 - 149 mg/dL   HDL 63 >60 mg/dL   VLDL Cholesterol Cal 41 (H) 5 - 40 mg/dL   LDL Chol Calc (NIH) 77 0 - 99 mg/dL   Chol/HDL Ratio 2.9 0.0 - 4.4 ratio  Hepatitis C antibody    Collection Time: 07/20/24 11:12 AM  Result Value Ref Range   Hep C Virus Ab Non Reactive Non Reactive  VITAMIN D  25 Hydroxy (Vit-D Deficiency, Fractures)   Collection Time: 07/20/24 11:12 AM  Result Value Ref Range   Vit D, 25-Hydroxy 53.7 30.0 - 100.0 ng/mL  TSH   Collection Time: 07/20/24 11:12 AM  Result Value Ref Range   TSH 0.655 0.450 - 4.500 uIU/mL  T4, Free   Collection Time: 07/20/24 11:12 AM  Result Value Ref Range   Free T4 1.78 (H) 0.82 - 1.77 ng/dL  Microalbumin / creatinine urine ratio   Collection Time: 07/20/24 11:19 AM  Result Value Ref Range   Creatinine, Urine 170.7 Not Estab. mg/dL   Microalbumin, Urine 86.8 Not Estab. ug/mL   Microalb/Creat Ratio 8 0 - 29 mg/g creat       Pertinent labs & imaging results that were available during my care of the patient were reviewed by me and considered in my medical decision making.  Assessment & Plan:  Andrea Shannon was seen today for nasal congestion.  Diagnoses and all orders for this visit:  Acute non-recurrent frontal sinusitis -     azelastine (ASTELIN) 0.1 % nasal spray; Place 1 spray into both nostrils 2 (two) times daily. Use in each nostril as directed -     azithromycin (ZITHROMAX) 250 MG tablet; Take 2 tablets on day 1, then 1 tablet daily on days 2 through 5     Assessment and Plan Andrea Shannon is a 78 year old female seen today for sinusitis, no acute distress Assessment & Plan Acute frontal sinusitis and allergic rhinitis Diagnosed with acute frontal sinusitis and allergic rhinitis based on symptoms and examination findings. - Prescribed Z-Pak (azithromycin) with dosing instructions: two pills on day one, then one pill daily for four days. - Prescribed Astelin nasal spray, one spray per nostril twice daily. - Advised to maintain hydration.      Continue all other maintenance medications.  Follow up plan: Return if symptoms worsen or fail to improve.   Continue healthy lifestyle choices, including  diet (rich in fruits, vegetables, and lean proteins, and low in salt and simple carbohydrates) and exercise (at least 30 minutes of moderate physical activity daily).  Educational handout given for    How to Perform a Sinus Rinse A sinus rinse is a home treatment. It rinses your sinuses with a mixture of salt and water (saline solution). Sinuses are air-filled spaces in your skull behind the bones of your face and forehead. They open into your nasal cavity. A sinus rinse can help to clear your nasal cavity. It can  clear mucus, dirt, dust, or pollen. You may do a sinus rinse when you have: A cold. A virus. Allergies. A sinus infection. A stuffy nose. What are the risks? A sinus rinse is normally very safe and helpful. However, there are a few risks. These include: A burning feeling in the sinuses. This may happen if you do not make the saline solution as told. Follow all directions. Nasal irritation. Infection from unclean water. This is rare, but it can happen. Do not do a sinus rinse if you have had: Ear or nasal surgery. An ear infection. Plugged ears. Supplies needed: Saline solution or powder. Distilled or germ-free (sterile) water may be needed to mix with saline powder. You may use boiled and cooled tap water. Boil tap water for 5 minutes. Cool the water until it is lukewarm. Use within 24 hours. Do not use regular tap water to mix with the saline solution. Neti pot or nasal rinse bottle. These release the saline solution into your nose and through your sinuses. You can buy neti pots and rinse bottles: At your local pharmacy. At a health food store. Online. How to do a sinus rinse  Wash your hands with soap and water for at least 20 seconds. If you cannot use soap and water, use hand sanitizer. Wash your device using the directions that came with it. Dry your device. Use the solution that comes with your device or one that is sold separately in stores. Follow the mixing  directions on the package if you need to mix with germ-free or distilled water. Fill your device with the amount of saline solution stated in the device instructions. Stand by a sink and tilt your head sideways over the sink. Place the spout of the device in your upper nostril (the one closer to the ceiling). Gently pour or squeeze the saline solution into your nasal cavity. The liquid should drain to your lower nostril if you are not too stuffed up (congested). While rinsing, breathe through your open mouth. Gently blow your nose to clear any mucus and rinse solution. Blowing too hard may cause ear pain. Turn your head in the other direction and repeat in your other nostril. Clean and rinse your device with clean water. Air-dry your device. Talk with your doctor or pharmacist if you have questions about how to do a sinus rinse. Summary A sinus rinse is a home treatment. It rinses your sinuses with a mixture of salt and water (saline solution). A sinus rinse can clear mucus, dirt, dust, or pollen. A sinus rinse is normally very safe and helpful. Follow all instructions carefully. This information is not intended to replace advice given to you by your health care provider. Make sure you discuss any questions you have with your health care provider. Document Revised: 03/24/2021 Document Reviewed: 03/24/2021 Elsevier Patient Education  2024 [employer].    The above assessment and management plan was discussed with the patient. The patient verbalized understanding of and has agreed to the management plan. Patient is aware to call the clinic if they develop any new symptoms or if symptoms persist or worsen. Patient is aware when to return to the clinic for a follow-up visit. Patient educated on when it is appropriate to go to the emergency department.   Averyana Pillars [place] Thompson, DNP [facility] [address] [place], [place] [address] [phone]

## 2024-09-04 NOTE — Telephone Encounter (Signed)
 Appt made.

## 2024-09-04 NOTE — Telephone Encounter (Signed)
 FYI Only or Action Required?: FYI only for provider: appointment scheduled on 09/04/24.  Patient was last seen in primary care on 07/20/2024 by Severa Rock HERO, FNP.  Called Nurse Triage reporting Sinusitis.  Symptoms began 1-2 weeks.  Interventions attempted: OTC medications: see below.  Symptoms are: gradually worsening.  Triage Disposition: See Physician Within 24 Hours  Patient/caregiver understands and will follow disposition?: Yes Reason for Disposition  Earache  Answer Assessment - Initial Assessment Questions No improvement with OTC sinus and allergy medications, alka seltzer, zicam.   1. LOCATION: Where does it hurt?      Headache, earache and sinus pain/pressure 2. ONSET: When did the sinus pain start?  (e.g., hours, days)      Not sure, thought it was allergies at first. Approximately 1- 2 weeks. 3. SEVERITY: How bad is the pain?   (Scale 0-10; or none, mild, moderate or severe)     Slight headache 4. RECURRENT SYMPTOM: Have you ever had sinus problems before? If Yes, ask: When was the last time? and What happened that time?      Yes, states hx of allergies and sinusitis 6. NASAL DISCHARGE: Do you have discharge from your nose? If so ask, What color?     Clear 7. FEVER: Do you have a fever? If Yes, ask: What is it, how was it measured, and when did it start?      Not known  Protocols used: Sinus Pain or Congestion-A-AH   Copied from CRM #[id number]. Topic: Clinical - Red Word Triage >> Sep 04, 2024  9:02 AM Miquel SAILOR wrote: Red Word that prompted transfer to Nurse Triage:  Headache/Sinus congestion/mucus yellow since 1-2 weeks but getting worse over counter medciation not working

## 2024-09-06 ENCOUNTER — Ambulatory Visit (INDEPENDENT_AMBULATORY_CARE_PROVIDER_SITE_OTHER): Admitting: Nurse Practitioner

## 2024-09-06 ENCOUNTER — Encounter: Payer: Self-pay | Admitting: Nurse Practitioner

## 2024-09-06 VITALS — BP 118/60 | HR 98 | Ht 63.0 in | Wt 146.6 lb

## 2024-09-06 DIAGNOSIS — E890 Postprocedural hypothyroidism: Secondary | ICD-10-CM

## 2024-09-06 DIAGNOSIS — E559 Vitamin D deficiency, unspecified: Secondary | ICD-10-CM | POA: Diagnosis not present

## 2024-09-06 DIAGNOSIS — E0520 Thyrotoxicosis with toxic multinodular goiter without thyrotoxic crisis or storm: Secondary | ICD-10-CM | POA: Diagnosis not present

## 2024-09-06 NOTE — Progress Notes (Signed)
 09/06/2024, 11:15 AM 18    Endocrinology follow-up note    Subjective:    Patient ID: Andrea Shannon, female    DOB: 10/15/1946, PCP Rakes, Rock HERO, FNP   Past Medical History:  Diagnosis Date   Acid reflux    Arthritis    Diabetes mellitus, type II (HCC)    Hypertension    Hyperthyroidism    Hypothyroidism    Past Surgical History:  Procedure Laterality Date   ABDOMINAL HYSTERECTOMY     CATARACT EXTRACTION Left    Social History   Socioeconomic History   Marital status: Widowed    Spouse name: Not on file   Number of children: 1   Years of education: 12   Highest education level: High school graduate  Occupational History   Not on file  Tobacco Use   Smoking status: Never   Smokeless tobacco: Never  Vaping Use   Vaping status: Never Used  Substance and Sexual Activity   Alcohol  use: Not Currently   Drug use: Never   Sexual activity: Not Currently    Birth control/protection: Surgical  Other Topics Concern   Not on file  Social History Narrative   Right handed   Drinks caffeine coffee   Lives alone   Retired   One son   Social Drivers of Corporate Investment Banker Strain: Low Risk  (01/11/2024)   Overall Financial Resource Strain (CARDIA)    Difficulty of Paying Living Expenses: Not hard at all  Food Insecurity: No Food Insecurity (04/25/2024)   Hunger Vital Sign    Worried About Running Out of Food in the Last Year: Never true    Ran Out of Food in the Last Year: Never true  Transportation Needs: No Transportation Needs (04/25/2024)   PRAPARE - Administrator, Civil Service (Medical): No    Lack of Transportation (Non-Medical): No  Physical Activity: Insufficiently Active (01/11/2024)   Exercise Vital Sign    Days of Exercise per Week: 3 days    Minutes of Exercise per Session: 30 min  Stress: No Stress Concern Present (01/11/2024)   [employer] of Occupational Health -  Occupational Stress Questionnaire    Feeling of Stress : Not at all  Social Connections: Moderately Integrated (01/11/2024)   Social Connection and Isolation Panel    Frequency of Communication with Friends and Family: More than three times a week    Frequency of Social Gatherings with Friends and Family: Three times a week    Attends Religious Services: More than 4 times per year    Active Member of Clubs or Organizations: Yes    Attends Banker Meetings: More than 4 times per year    Marital Status: Widowed   Outpatient Encounter Medications as of 09/06/2024  Medication Sig   aspirin 81 MG chewable tablet Chew by mouth daily.   azelastine (ASTELIN) 0.1 % nasal spray Place 1 spray into both nostrils 2 (two) times daily. Use in each nostril as directed   azithromycin (ZITHROMAX) 250 MG tablet Take 2 tablets on day 1, then 1 tablet daily on days 2 through 5   diclofenac  (VOLTAREN ) 75 MG EC tablet Take 1 tablet (75 mg  total) by mouth 2 (two) times daily.   levothyroxine  (SYNTHROID ) 50 MCG tablet Take 1 tablet (50 mcg total) by mouth daily.   levothyroxine  (SYNTHROID ) 50 MCG tablet Take 1 tablet (50 mcg total) by mouth daily.   lisinopril -hydrochlorothiazide  (ZESTORETIC ) 20-12.5 MG tablet Take 1 tablet by mouth daily.   meclizine  (ANTIVERT ) 12.5 MG tablet Take 1 tablet (12.5 mg total) by mouth 3 (three) times daily as needed for dizziness.   metFORMIN  (GLUCOPHAGE ) 500 MG tablet Take 1 tablet (500 mg total) by mouth 2 (two) times daily with a meal.   pantoprazole  (PROTONIX ) 40 MG tablet Take 1 tablet (40 mg total) by mouth daily.   sucralfate  (CARAFATE ) 1 g tablet Take 1 tablet (1 g total) by mouth 2 (two) times daily.   vitamin B-12 (CYANOCOBALAMIN ) 100 MCG tablet Take 100 mcg by mouth daily.   No facility-administered encounter medications on file as of 09/06/2024.   ALLERGIES: Allergies  Allergen Reactions   No Known Allergies     VACCINATION STATUS:  There is no  immunization history on file for this patient.  Thyroid  Problem Presents for follow-up (-She was treated with I-131 on September 29, 2019 for toxic multinodular goiter.   She reports family history of thyroid  dysfunction, family history of thyroid  malignancy.) visit. Symptoms include heat intolerance. Patient reports no anxiety, cold intolerance, constipation, depressed mood, diarrhea, fatigue, leg swelling, palpitations, tremors, weight gain or weight loss. (Says recently she has been excessively sweating, hot flashes, even without doing anything to exert such.  She notes night sweats as well.  She did tell me she is trying to use up her supply of 50 mcg pills of her Levothyroxine , so has been splitting tablets to equal the dose she needs and when it splits, it does not split evenly.) The symptoms have been stable.     Andrea Shannon is 78 y.o. female who presents today with a medical history as above.  Review of systems  Constitutional: + Minimally fluctuating body weight,  current There is no height or weight on file to calculate BMI. , no fatigue, + subjective hyperthermia, no subjective hypothermia Eyes: no blurry vision, no xerophthalmia ENT: no sore throat, no nodules palpated in throat, no dysphagia/odynophagia, no hoarseness Cardiovascular: no chest pain, no shortness of breath, no palpitations, no leg swelling Respiratory: no cough, no shortness of breath Gastrointestinal: no nausea/vomiting/diarrhea Musculoskeletal: no muscle/joint aches Skin: no rashes, no hyperemia Neurological: no tremors, no numbness, no tingling, no dizziness Psychiatric: no depression, no anxiety  Objective:    There were no vitals taken for this visit.  Wt Readings from Last 3 Encounters:  09/04/24 145 lb 6.4 oz (66 kg)  07/20/24 140 lb 6.4 oz (63.7 kg)  06/05/24 143 lb 6.4 oz (65 kg)     BP Readings from Last 3 Encounters:  09/04/24 114/62  07/20/24 112/65  06/07/24 131/63     Physical Exam-  Limited  Constitutional:  There is no height or weight on file to calculate BMI. , not in acute distress, normal state of mind Eyes:  EOMI, no exophthalmos Musculoskeletal: no gross deformities, strength intact in all four extremities, no gross restriction of joint movements Skin:  no rashes, no hyperemia Neurological: no tremor with outstretched hands   Recent Results (from the past 2160 hours)  Bayer DCA Hb A1c Waived     Status: Abnormal   Collection Time: 07/20/24 11:10 AM  Result Value Ref Range   HB A1C (BAYER DCA - WAIVED) 6.0 (H)  4.8 - 5.6 %    Comment:          Prediabetes: 5.7 - 6.4          Diabetes: >6.4          Glycemic control for adults with diabetes: <7.0   Vitamin B12     Status: Abnormal   Collection Time: 07/20/24 11:12 AM  Result Value Ref Range   Vitamin B-12 1,976 (H) 232 - 1,245 pg/mL  CBC with Differential/Platelet     Status: Abnormal   Collection Time: 07/20/24 11:12 AM  Result Value Ref Range   WBC 5.9 3.4 - 10.8 x10E3/uL   RBC 4.90 3.77 - 5.28 x10E6/uL   Hemoglobin 11.9 11.1 - 15.9 g/dL   Hematocrit 60.4 65.9 - 46.6 %   MCV 81 79 - 97 fL   MCH 24.3 (L) 26.6 - 33.0 pg   MCHC 30.1 (L) 31.5 - 35.7 g/dL   RDW 82.9 (H) 88.2 - 84.5 %   Platelets 476 (H) 150 - 450 x10E3/uL   Neutrophils 63 Not Estab. %   Lymphs 26 Not Estab. %   Monocytes 6 Not Estab. %   Eos 3 Not Estab. %   Basos 1 Not Estab. %   Neutrophils Absolute 3.8 1.4 - 7.0 x10E3/uL   Lymphocytes Absolute 1.6 0.7 - 3.1 x10E3/uL   Monocytes Absolute 0.3 0.1 - 0.9 x10E3/uL   EOS (ABSOLUTE) 0.2 0.0 - 0.4 x10E3/uL   Basophils Absolute 0.0 0.0 - 0.2 x10E3/uL   Immature Granulocytes 0 Not Estab. %   Immature Grans (Abs) 0.0 0.0 - 0.1 x10E3/uL  CMP14+EGFR     Status: Abnormal   Collection Time: 07/20/24 11:12 AM  Result Value Ref Range   Glucose 77 70 - 99 mg/dL   BUN 20 8 - 27 mg/dL   Creatinine, Ser 8.90 (H) 0.57 - 1.00 mg/dL   eGFR 53 (L) >40 fO/fpw/8.26   BUN/Creatinine Ratio 18 12 - 28    Sodium 138 134 - 144 mmol/L   Potassium 4.2 3.5 - 5.2 mmol/L   Chloride 98 96 - 106 mmol/L   CO2 23 20 - 29 mmol/L   Calcium 10.2 8.7 - 10.3 mg/dL   Total Protein 6.8 6.0 - 8.5 g/dL   Albumin 4.6 3.8 - 4.8 g/dL   Globulin, Total 2.2 1.5 - 4.5 g/dL   Bilirubin Total 0.2 0.0 - 1.2 mg/dL   Alkaline Phosphatase 65 49 - 135 IU/L   AST 15 0 - 40 IU/L   ALT 16 0 - 32 IU/L  Lipid panel     Status: Abnormal   Collection Time: 07/20/24 11:12 AM  Result Value Ref Range   Cholesterol, Total 181 100 - 199 mg/dL   Triglycerides 745 (H) 0 - 149 mg/dL   HDL 63 >60 mg/dL   VLDL Cholesterol Cal 41 (H) 5 - 40 mg/dL   LDL Chol Calc (NIH) 77 0 - 99 mg/dL   Chol/HDL Ratio 2.9 0.0 - 4.4 ratio    Comment:                                   T. Chol/HDL Ratio                                             Men  Women                               1/2 Avg.Risk  3.4    3.3                                   Avg.Risk  5.0    4.4                                2X Avg.Risk  9.6    7.1                                3X Avg.Risk 23.4   11.0   Hepatitis C antibody     Status: None   Collection Time: 07/20/24 11:12 AM  Result Value Ref Range   Hep C Virus Ab Non Reactive Non Reactive    Comment: HCV antibody alone does not differentiate between previously resolved infection and active infection. Equivocal and Reactive HCV antibody results should be followed up with an HCV RNA test to support the diagnosis of active HCV infection.   VITAMIN D  25 Hydroxy (Vit-D Deficiency, Fractures)     Status: None   Collection Time: 07/20/24 11:12 AM  Result Value Ref Range   Vit D, 25-Hydroxy 53.7 30.0 - 100.0 ng/mL    Comment: Vitamin D  deficiency has been defined by the Institute of Medicine and an Endocrine Society practice guideline as a level of serum 25-OH vitamin D  less than 20 ng/mL (1,2). The Endocrine Society went on to further define vitamin D  insufficiency as a level between 21 and 29 ng/mL (2). 1. IOM  (Institute of Medicine). 2010. Dietary reference    intakes for calcium and D. [place]: The    [employer]. 2. Holick MF, Binkley Crane, Bischoff-Ferrari HA, et al.    Evaluation, treatment, and prevention of vitamin D     deficiency: an Endocrine Society clinical practice    guideline. JCEM. 2011 Jul; 96(7):1911-30.   TSH     Status: None   Collection Time: 07/20/24 11:12 AM  Result Value Ref Range   TSH 0.655 0.450 - 4.500 uIU/mL  T4, Free     Status: Abnormal   Collection Time: 07/20/24 11:12 AM  Result Value Ref Range   Free T4 1.78 (H) 0.82 - 1.77 ng/dL  Microalbumin / creatinine urine ratio     Status: None   Collection Time: 07/20/24 11:19 AM  Result Value Ref Range   Creatinine, Urine 170.7 Not Estab. mg/dL   Microalbumin, Urine 86.8 Not Estab. ug/mL   Microalb/Creat Ratio 8 0 - 29 mg/g creat    Comment:                        Normal:                0 -  29                        Moderately increased: 30 - 300                        Severely increased:       >300  TSH     Status: None   Collection Time: 08/30/24 10:42 AM  Result Value Ref Range   TSH 0.690 0.450 - 4.500 uIU/mL  T4, free     Status: None   Collection Time: 08/30/24 10:42 AM  Result Value Ref Range   Free T4 1.65 0.82 - 1.77 ng/dL   Ultrasound of the thyroid  on June 27, 2018 showed right lobe measuring 4.9 x 1.9 x 2.1 cm with 1 dominant mixed cystic and solid nodule measuring 2.2 x 1.5 x 1.8 cm.  Another nodule in the superior right lobe measuring 1.9 x 0.9 x 1.2 cm. Left lobe heterogeneous measuring 4.7 x 3.2 x 2.3 cm with dominant mixed cystic and solid nodule in the central left lobe measuring 2.6 x 2.4 x 2.3 cm.  Separate mixed cystic and solid nodule in the superior left lobe measuring 1.7 x 1.3 x 1.4 cm. Impression:  innumerable thyroid  nodules, increasing probability of benignity.  2 suspicious right thyroid  nodule measuring 1.9 x 0.9 x 1.2 cm in the superior right lobe.   Ultrasound-guided FNA is suggested.  More suspicious left thyroid  nodule in the superior left lobe measuring 2.6 x 2.4 x 2.3 cm.  Ultrasound-guided FNA suggested.   Fine-needle aspiration on November 30, 2018 revealed atypia of undetermined significance,  afirma testing-reported benign, with malignancy risk less than 4%.     Thyroid  uptake and scan on September 04, 2019 showed enlarged thyroid  gland with several focal areas of increased uptake, likely representing nodules.  No areas of photopenia.  24-hour uptake 35%.     US  Thyroid  11/15/2018 Narrative & Impression  CLINICAL DATA:  Multinodular goiter   EXAM: THYROID  ULTRASOUND   TECHNIQUE: Ultrasound examination of the thyroid  gland and adjacent soft tissues was performed.   COMPARISON:  06/27/2018 by report only from [facility]   FINDINGS: Parenchymal Echotexture: Moderately heterogenous   Isthmus: 0.3 cm thickness, stable   Right lobe: 5.6 x 2.3 x 2 cm, previously 4.9 x 1.9 x 2.1   Left lobe: 6.1 x 3.3 x 2.3 cm, previously 4.7 x 3.2 x 2.3   _________________________________________________________   Estimated total number of nodules >/= 1 cm: 6-10   Number of spongiform nodules >/=  2 cm not described below (TR1): 0   Number of mixed cystic and solid nodules >/= 1.5 cm not described below (TR2): 0   _________________________________________________________   Nodule # 1:   Location: Right; Superior   Maximum size: 1.4 cm; Other 2 dimensions: 1.1 x 1 cm   Composition: mixed cystic and solid (1)   Echogenicity: hypoechoic (2)   Shape: not taller-than-wide (0)   Margins: ill-defined (0)   Echogenic foci: none (0)   ACR TI-RADS total points: 3.   ACR TI-RADS risk category: TR3 (3 points).   ACR TI-RADS recommendations:   Given size (<1.4 cm) and appearance, this nodule does NOT meet TI-RADS criteria for biopsy or dedicated follow-up.    _________________________________________________________   Nodule # 2: 2.7 x 1.9 x 2 cm spongiform nodule, mid right; This nodule does NOT meet TI-RADS criteria for biopsy or dedicated follow-up.   Nodule # 3: 1.5 x 0.9 x 1.2 cm spongiform nodule, inferior right; This nodule does NOT meet TI-RADS criteria for biopsy or dedicated follow-up.   Nodule # 4:   Location: Left; Superior   Maximum size: 2 cm; Other 2 dimensions: 1.7 x 1.7 cm   Composition: mixed cystic and solid (1)   Echogenicity: hypoechoic (2)  Shape: not taller-than-wide (0)   Margins: smooth (0)   Echogenic foci: none (0)   ACR TI-RADS total points: 3.   ACR TI-RADS risk category: TR3 (3 points).   ACR TI-RADS recommendations:   *Given size (>/= 1.5 - 2.4 cm) and appearance, a follow-up ultrasound in 1 year should be considered based on TI-RADS criteria.   _________________________________________________________   Nodule # 5:   Location: Left; Mid   Maximum size: 3 cm; Other 2 dimensions: 2.8 x 1.9 cm   Composition: mixed cystic and solid (1)   Echogenicity: isoechoic (1)   Shape: not taller-than-wide (0)   Margins: smooth (0)   Echogenic foci: none (0)   ACR TI-RADS total points: 2.   ACR TI-RADS risk category: TR2 (2 points).   ACR TI-RADS recommendations:   This nodule does NOT meet TI-RADS criteria for biopsy or dedicated follow-up.   _________________________________________________________   Nodule # 6:   Location: Left; Inferior   Maximum size: 3.2 cm; Other 2 dimensions: 2.7 x 1.9 cm   Composition: mixed cystic and solid (1)   Echogenicity: isoechoic (1)   Shape: not taller-than-wide (0)   Margins: smooth (0)   Echogenic foci: none (0)   ACR TI-RADS total points: 2.   ACR TI-RADS risk category: TR2 (2 points).   ACR TI-RADS recommendations:   This nodule does NOT meet TI-RADS criteria for biopsy or dedicated follow-up.    _________________________________________________________   Nodule # 7:   Location: Left; Inferior posterior   Maximum size: 3 cm; Other 2 dimensions: 2.9 x 2.3 cm   Composition: solid/almost completely solid (2)   Echogenicity: isoechoic (1)   Shape: taller-than-wide (3)   Margins: ill-defined (0)   Echogenic foci: none (0)   ACR TI-RADS total points: 6.   ACR TI-RADS risk category: TR4 (4-6 points).   ACR TI-RADS recommendations:   **Given size (>/= 1.5 cm) and appearance, fine needle aspiration of this moderately suspicious nodule should be considered based on TI-RADS criteria.   IMPRESSION: 1. Thyromegaly with bilateral nodules. 2. Recommend FNA biopsy of moderately suspicious 3 cm inferior posterior nodule #7; This was not described on the prior study. 3. Recommend annual/biennial ultrasound follow-up of additional lesion as above, until stability x5 years confirmed.   -------------------------------------------------------------------------------------------------------------------------------  FOLLOW UP THYROID  US  FROM 12/02/20 CLINICAL DATA:  Hypothyroidism. Left inferior thyroid  nodule biopsy on 11/30/2018.   EXAM: THYROID  ULTRASOUND   TECHNIQUE: Ultrasound examination of the thyroid  gland and adjacent soft tissues was performed.   COMPARISON:  11/15/2018   FINDINGS: Parenchymal Echotexture: Moderately heterogenous   Isthmus: 0.2 cm, previously 0.3 cm   Right lobe: 4.9 x 1.7 x 1.6 cm, previously 5.6 x 2.3 x 2.0 cm   Left lobe: 5.1 x 2.2 x 1.7 cm, previously 6.1 x 2.8 x 2.3 cm   _________________________________________________________   Estimated total number of nodules >/= 1 cm: 5   Number of spongiform nodules >/=  2 cm not described below (TR1): 0   Number of mixed cystic and solid nodules >/= 1.5 cm not described below (TR2): 0   _________________________________________________________   Again noted are multiple right thyroid   nodules. The right thyroid  nodules are predominantly cystic or spongiform.   Nodule 2 in the right mid thyroid  lobe has markedly decreased in size. This is a spongiform or mixed cystic and solid nodule that measures 1.3 x 0.9 x 0.9 cm and previously measured 2.7 x 1.9 x 2.0 cm.   Nodule 3 in the right inferior  thyroid  is mixed cystic and solid nodule that measures roughly 1.5 x 1.0 x 1.2 cm and previously measured 1.3 x 1.2 x 0.9 cm.   Multiple left thyroid  nodules that are predominantly mixed cystic and solid nodules.   Nodule 5 in the left mid thyroid  lobe measures 1.3 x 0.5 x 0.5 cm and previously measured 2.8 x 1.9 x 3.0 cm. This is a mixed cystic and solid nodule and the solid portion is isoechoic.   Nodule 6 is a mixed cystic and solid nodule in the left inferior thyroid  lobe measures 1.6 x 0.9 x 1.5 cm and previously measured 2.7 x 1.9 x 3.2 cm.   Nodule 7 in the left inferior thyroid  lobe represents the previously biopsied nodule. This nodule is slightly hypoechoic and has scattered cystic areas. This nodule measures 2.1 x 2.0 x 1.5 cm and previously measured 3.0 x 2.9 x 2.3 cm.   IMPRESSION: 1. Multinodular goiter. 2. Most of the nodules are mixed cystic and solid composition. Many of the nodules have decreased in size as described. 3. No new suspicious thyroid  nodules.   The above is in keeping with the ACR TI-RADS recommendations - J Am Coll Radiol 2017;14:587-595.     Electronically Signed   By: Juliene Balder M.D.   On: 12/02/2020 14:20   Latest Reference Range & Units 12/01/23 11:58 05/29/24 08:55 07/20/24 11:12 08/30/24 10:42  TSH 0.450 - 4.500 uIU/mL 0.555 0.155 (L) 0.655 0.690  T4,Free(Direct) 0.82 - 1.77 ng/dL 9.07 8.04 (H) 8.21 (H) 1.65  (L): Data is abnormally low (H): Data is abnormally high  Assessment & Plan:   1. RAI - induced hypothyroidism -She status post I-131 thyroid  ablation on September 29, 2019.    -Her previsit TFTs are consistent with  appropriate hormone replacement.  She is advised to continue her dose of Levothyroxine  50 mcg po daily before breakfast.  Will recheck TFTs prior to next visit and adjust dose accordingly.   - We discussed about the correct intake of her thyroid  hormone, on empty stomach at fasting, with water, separated by at least 30 minutes from breakfast and other medications,  and separated by more than 4 hours from calcium, iron , multivitamins, acid reflux medications (PPIs). -Patient is made aware of the fact that thyroid  hormone replacement is needed for life, dose to be adjusted by periodic monitoring of thyroid  function tests.  2.  Multinodular goiter -Her fine-needle aspiration was significant for atypia of undetermined significance, a sample was sent for afirma testing-with grossly benign finding with malignancy is less than 4%.  She will not need surgical intervention at this time.    -Her thyroid  US  from 10/2018 shows multiple nodules which recommended follow up with Us  in 1 year. Her repeat thyroid  US  from 11/2020 shows that all nodules have decreased in size and there are no new nodules.  No additional surveillance recommended at this time.    - I advised her  to maintain close follow up with Rakes, Rock HERO, FNP for primary care needs.      I spent  25  minutes in the care of the patient today including review of labs from Thyroid  Function, CMP, and other relevant labs ; imaging/biopsy records (current and previous including abstractions from other facilities); face-to-face time discussing  her lab results and symptoms, medications doses, her options of short and long term treatment based on the latest standards of care / guidelines;   and documenting the encounter.  Andrea Shannon  participated in  the discussions, expressed understanding, and voiced agreement with the above plans.  All questions were answered to her satisfaction. she is encouraged to contact clinic should she have any questions or  concerns prior to her return visit.  Follow up plan: No follow-ups on file.   [place]enton Rio, [facility] [address] [place], [place] [address] Phone: [phone] Fax: [phone]   09/06/2024, 11:15 AM

## 2024-09-06 NOTE — Patient Instructions (Signed)

## 2024-09-11 ENCOUNTER — Inpatient Hospital Stay: Attending: Oncology

## 2024-09-11 DIAGNOSIS — N189 Chronic kidney disease, unspecified: Secondary | ICD-10-CM | POA: Diagnosis not present

## 2024-09-11 DIAGNOSIS — D508 Other iron deficiency anemias: Secondary | ICD-10-CM

## 2024-09-11 DIAGNOSIS — D509 Iron deficiency anemia, unspecified: Secondary | ICD-10-CM | POA: Diagnosis not present

## 2024-09-11 DIAGNOSIS — D631 Anemia in chronic kidney disease: Secondary | ICD-10-CM | POA: Diagnosis not present

## 2024-09-11 DIAGNOSIS — D75839 Thrombocytosis, unspecified: Secondary | ICD-10-CM | POA: Diagnosis not present

## 2024-09-11 LAB — FERRITIN: Ferritin: 418 ng/mL — ABNORMAL HIGH (ref 11–307)

## 2024-09-11 LAB — IRON AND TIBC
Iron: 78 ug/dL (ref 28–170)
Saturation Ratios: 27 % (ref 10.4–31.8)
TIBC: 295 ug/dL (ref 250–450)
UIBC: 217 ug/dL

## 2024-09-11 LAB — CBC
HCT: 34.6 % — ABNORMAL LOW (ref 36.0–46.0)
Hemoglobin: 10.7 g/dL — ABNORMAL LOW (ref 12.0–15.0)
MCH: 25.3 pg — ABNORMAL LOW (ref 26.0–34.0)
MCHC: 30.9 g/dL (ref 30.0–36.0)
MCV: 81.8 fL (ref 80.0–100.0)
Platelets: 269 K/uL (ref 150–400)
RBC: 4.23 MIL/uL (ref 3.87–5.11)
RDW: 16.2 % — ABNORMAL HIGH (ref 11.5–15.5)
WBC: 5.5 K/uL (ref 4.0–10.5)
nRBC: 0 % (ref 0.0–0.2)

## 2024-09-12 ENCOUNTER — Encounter: Payer: Medicare Other | Admitting: Psychology

## 2024-09-18 ENCOUNTER — Inpatient Hospital Stay: Admitting: Physician Assistant

## 2024-09-18 NOTE — Progress Notes (Unsigned)
 [facility] [address], [place] [address]   CLINIC:  Medical Oncology/Hematology  PCP:  Severa Rock HERO, FNP [address] [place] [address] [phone]   REASON FOR VISIT:  Follow-up for normocytic anemia and thrombocytosis  CURRENT THERAPY: Intermittent IV iron  + oral iron  tablet every other day  INTERVAL HISTORY:   Andrea Shannon 78 y.o. female returns for routine follow-up of normocytic anemia and thrombocytosis She was last seen by Dr. Rogers on 05/16/2024. Received Monoferric  x 1 on 06/07/2024.  She  denies any recent surgeries, hospitalizations, or changes in baseline health status.   At today's visit, she reports feeling fairly well.  She  reports 50% energy and 50% appetite.   She  is maintaining stable weight at this time.  She denies any rectal bleeding or melena. She has baseline fatigue. No ice pica, headaches, lightheadedness, syncope, chest pain, or dyspnea on exertion. She stopped taking iron  tablet due to constipation.  ASSESSMENT & PLAN:  1.  Microcytic/normocytic anemia # Anemia due to CKD stage IIIa - Patient seen at the request of Rock Severa, FNP - She has mild anemia since at least January 2023 (outside records show microcytic anemia dating back to at least 2017), intermittent microcytosis - Patient reports anemia for many many years, but is uncertain of the age of onset.  Denies any family history of sickle cell or thalassemia. - SPEP and immunofixation were normal.  Normal ESR and CRP.  Normal copper , B12, folate.  Normal LDH and reticulocytes. - Unable to tolerate oral iron  due to severe constipation - Colonoscopy in 2019 - Labs from 04/25/2024 showed iron  deficiency (ferritin 30, iron  saturation 19%).  Received Monoferric  x 1 on 06/07/2024. - No BRBPR/melena. - Most recent labs (09/11/2024) with elevated ferritin 418, iron  saturation 27%.  Hgb low at 10.7, with MCV 81.8. - PLAN: Residual anemia may be related to CKD,  although I do wonder if she has some underlying thalassemia or sickle cell trait.  Recommend hemoglobin electrophoresis testing and alpha thalassemia genotyping. - Otherwise, RTC in 4 months for repeat labs and office visit.  2.  Thrombocytosis, reactive - Elevated platelet count ranging from 471-508 since July 2023, around the same time that she had onset of microcytic anemia - JAK2 V617F with reflex testing was NEGATIVE - Platelets improved to normal after iron  levels improved. - No B symptoms. No prior thrombosis/MI/CVA.   3.  Social/Family History: - Lives at home alone. Independent ADL's and IADL's. No tobacco use. No chemical exposures.  - 3 nieces had breast cancer.   PLAN SUMMARY: >> Labs today = Hemoglobin electrophoresis + alpha thalassemia genotype >> ** Nurse to call with results if normal or simple abnormalities.  If any major abnormalities, we will bring patient in for another office visit to discuss. ** >> Labs in 4 months = CBC/D, BMP, ferritin, iron /TIBC >> OFFICE visit in 4 months (after labs)     REVIEW OF SYSTEMS:   Review of Systems  Constitutional:  Positive for fatigue. Negative for appetite change, chills, diaphoresis, fever and unexpected weight change.  HENT:   Negative for lump/mass and nosebleeds.   Eyes:  Negative for eye problems.  Respiratory:  Negative for cough, hemoptysis and shortness of breath.   Cardiovascular:  Positive for palpitations. Negative for chest pain and leg swelling.  Gastrointestinal:  Negative for abdominal pain, blood in stool, constipation, diarrhea, nausea and vomiting.  Genitourinary:  Negative for hematuria.   Skin: Negative.  Neurological:  Positive for numbness. Negative for dizziness, headaches and light-headedness.  Hematological:  Does not bruise/bleed easily.     PHYSICAL EXAM:  ECOG PERFORMANCE STATUS: 1 - Symptomatic but completely ambulatory  Vitals:   09/19/24 1015  BP: (!) 140/74  Pulse: 79  Resp: 18  Temp:  99 F (37.2 C)  SpO2: 100%   Filed Weights   09/19/24 1015  Weight: 148 lb (67.1 kg)   Physical Exam Constitutional:      Appearance: Normal appearance. She is normal weight.  Cardiovascular:     Heart sounds: Normal heart sounds.  Pulmonary:     Breath sounds: Normal breath sounds.  Neurological:     General: No focal deficit present.     Mental Status: Mental status is at baseline.  Psychiatric:        Behavior: Behavior normal. Behavior is cooperative.     PAST MEDICAL/SURGICAL HISTORY:  Past Medical History:  Diagnosis Date   Acid reflux    Arthritis    Diabetes mellitus, type II (HCC)    Hypertension    Hyperthyroidism    Hypothyroidism    Past Surgical History:  Procedure Laterality Date   ABDOMINAL HYSTERECTOMY     CATARACT EXTRACTION Left     SOCIAL HISTORY:  Social History   Socioeconomic History   Marital status: Widowed    Spouse name: Not on file   Number of children: 1   Years of education: 12   Highest education level: High school graduate  Occupational History   Not on file  Tobacco Use   Smoking status: Never   Smokeless tobacco: Never  Vaping Use   Vaping status: Never Used  Substance and Sexual Activity   Alcohol  use: Not Currently   Drug use: Never   Sexual activity: Not Currently    Birth control/protection: Surgical  Other Topics Concern   Not on file  Social History Narrative   Right handed   Drinks caffeine coffee   Lives alone   Retired   One son   Social Drivers of Corporate Investment Banker Strain: Low Risk  (01/11/2024)   Overall Financial Resource Strain (CARDIA)    Difficulty of Paying Living Expenses: Not hard at all  Food Insecurity: No Food Insecurity (04/25/2024)   Hunger Vital Sign    Worried About Running Out of Food in the Last Year: Never true    Ran Out of Food in the Last Year: Never true  Transportation Needs: No Transportation Needs (04/25/2024)   PRAPARE - Administrator, Civil Service  (Medical): No    Lack of Transportation (Non-Medical): No  Physical Activity: Insufficiently Active (01/11/2024)   Exercise Vital Sign    Days of Exercise per Week: 3 days    Minutes of Exercise per Session: 30 min  Stress: No Stress Concern Present (01/11/2024)   [employer] of Occupational Health - Occupational Stress Questionnaire    Feeling of Stress : Not at all  Social Connections: Moderately Integrated (01/11/2024)   Social Connection and Isolation Panel    Frequency of Communication with Friends and Family: More than three times a week    Frequency of Social Gatherings with Friends and Family: Three times a week    Attends Religious Services: More than 4 times per year    Active Member of Clubs or Organizations: Yes    Attends Banker Meetings: More than 4 times per year    Marital Status: Widowed  Intimate  Partner Violence: Not At Risk (04/25/2024)   Humiliation, Afraid, Rape, and Kick questionnaire    Fear of Current or Ex-Partner: No    Emotionally Abused: No    Physically Abused: No    Sexually Abused: No    FAMILY HISTORY:  Family History  Problem Relation Age of Onset   Diabetes Mother    Asthma Father    Heart disease Sister        CHF   Diabetes Sister    Asthma Sister    Arthritis Sister    Diabetes Son    Breast cancer Niece     CURRENT MEDICATIONS:  Outpatient Encounter Medications as of 09/19/2024  Medication Sig   aspirin 81 MG chewable tablet Chew by mouth daily.   diclofenac  (VOLTAREN ) 75 MG EC tablet Take 1 tablet (75 mg total) by mouth 2 (two) times daily.   levothyroxine  (SYNTHROID ) 50 MCG tablet Take 1 tablet (50 mcg total) by mouth daily.   lisinopril -hydrochlorothiazide  (ZESTORETIC ) 20-12.5 MG tablet Take 1 tablet by mouth daily.   metFORMIN  (GLUCOPHAGE ) 500 MG tablet Take 1 tablet (500 mg total) by mouth 2 (two) times daily with a meal.   pantoprazole  (PROTONIX ) 40 MG tablet Take 1 tablet (40 mg total) by mouth daily.    sucralfate  (CARAFATE ) 1 g tablet Take 1 tablet (1 g total) by mouth 2 (two) times daily.   [DISCONTINUED] azelastine  (ASTELIN ) 0.1 % nasal spray Place 1 spray into both nostrils 2 (two) times daily. Use in each nostril as directed   [DISCONTINUED] meclizine  (ANTIVERT ) 12.5 MG tablet Take 1 tablet (12.5 mg total) by mouth 3 (three) times daily as needed for dizziness.   [DISCONTINUED] vitamin B-12 (CYANOCOBALAMIN ) 100 MCG tablet Take 100 mcg by mouth daily.   No facility-administered encounter medications on file as of 09/19/2024.    ALLERGIES:  Allergies  Allergen Reactions   No Known Allergies     LABORATORY DATA:  I have reviewed the labs as listed.  CBC    Component Value Date/Time   WBC 5.5 09/11/2024 1026   RBC 4.23 09/11/2024 1026   HGB 10.7 (L) 09/11/2024 1026   HGB 11.9 07/20/2024 1112   HCT 34.6 (L) 09/11/2024 1026   HCT 39.5 07/20/2024 1112   PLT 269 09/11/2024 1026   PLT 476 (H) 07/20/2024 1112   MCV 81.8 09/11/2024 1026   MCV 81 07/20/2024 1112   MCH 25.3 (L) 09/11/2024 1026   MCHC 30.9 09/11/2024 1026   RDW 16.2 (H) 09/11/2024 1026   RDW 17.0 (H) 07/20/2024 1112   LYMPHSABS 1.6 07/20/2024 1112   MONOABS 0.4 04/25/2024 1427   EOSABS 0.2 07/20/2024 1112   BASOSABS 0.0 07/20/2024 1112      Latest Ref Rng & Units 07/20/2024   11:12 AM 04/18/2024   10:38 AM 10/07/2023   10:27 AM  CMP  Glucose 70 - 99 mg/dL 77  74  821   BUN 8 - 27 mg/dL 20  14  12    Creatinine 0.57 - 1.00 mg/dL 8.90  8.94  8.82   Sodium 134 - 144 mmol/L 138  143  141   Potassium 3.5 - 5.2 mmol/L 4.2  4.1  4.5   Chloride 96 - 106 mmol/L 98  102  103   CO2 20 - 29 mmol/L 23  23  22    Calcium 8.7 - 10.3 mg/dL 89.7  9.9  9.6   Total Protein 6.0 - 8.5 g/dL 6.8  6.4  6.2   Total  Bilirubin 0.0 - 1.2 mg/dL 0.2  <9.7  <9.7   Alkaline Phos 49 - 135 IU/L 65  66  76   AST 0 - 40 IU/L 15  12  16    ALT 0 - 32 IU/L 16  12  12      DIAGNOSTIC IMAGING:  I have independently reviewed the relevant imaging  and discussed with the patient.   WRAP UP:  All questions were answered. The patient knows to call the clinic with any problems, questions or concerns.  Medical decision making: Moderate  Time spent on visit: I spent 20 minutes counseling the patient face to face. The total time spent in the appointment was 30 minutes and more than 50% was on counseling.  Pleasant Andrea Barefoot, PA-C  09/19/24 11:30 AM

## 2024-09-19 ENCOUNTER — Inpatient Hospital Stay: Attending: Oncology | Admitting: Physician Assistant

## 2024-09-19 ENCOUNTER — Inpatient Hospital Stay

## 2024-09-19 VITALS — BP 140/74 | HR 79 | Temp 99.0°F | Resp 18 | Ht 63.0 in | Wt 148.0 lb

## 2024-09-19 DIAGNOSIS — D509 Iron deficiency anemia, unspecified: Secondary | ICD-10-CM | POA: Insufficient documentation

## 2024-09-19 DIAGNOSIS — M1990 Unspecified osteoarthritis, unspecified site: Secondary | ICD-10-CM | POA: Diagnosis not present

## 2024-09-19 DIAGNOSIS — D75838 Other thrombocytosis: Secondary | ICD-10-CM | POA: Diagnosis not present

## 2024-09-19 DIAGNOSIS — Z79890 Hormone replacement therapy (postmenopausal): Secondary | ICD-10-CM | POA: Insufficient documentation

## 2024-09-19 DIAGNOSIS — N1832 Chronic kidney disease, stage 3b: Secondary | ICD-10-CM | POA: Insufficient documentation

## 2024-09-19 DIAGNOSIS — Z791 Long term (current) use of non-steroidal anti-inflammatories (NSAID): Secondary | ICD-10-CM | POA: Diagnosis not present

## 2024-09-19 DIAGNOSIS — N1831 Chronic kidney disease, stage 3a: Secondary | ICD-10-CM

## 2024-09-19 DIAGNOSIS — I129 Hypertensive chronic kidney disease with stage 1 through stage 4 chronic kidney disease, or unspecified chronic kidney disease: Secondary | ICD-10-CM | POA: Insufficient documentation

## 2024-09-19 DIAGNOSIS — Z7984 Long term (current) use of oral hypoglycemic drugs: Secondary | ICD-10-CM | POA: Diagnosis not present

## 2024-09-19 DIAGNOSIS — K5900 Constipation, unspecified: Secondary | ICD-10-CM | POA: Insufficient documentation

## 2024-09-19 DIAGNOSIS — K219 Gastro-esophageal reflux disease without esophagitis: Secondary | ICD-10-CM | POA: Diagnosis not present

## 2024-09-19 DIAGNOSIS — E039 Hypothyroidism, unspecified: Secondary | ICD-10-CM | POA: Diagnosis not present

## 2024-09-19 DIAGNOSIS — Z7982 Long term (current) use of aspirin: Secondary | ICD-10-CM | POA: Insufficient documentation

## 2024-09-19 DIAGNOSIS — D508 Other iron deficiency anemias: Secondary | ICD-10-CM | POA: Diagnosis not present

## 2024-09-19 DIAGNOSIS — D631 Anemia in chronic kidney disease: Secondary | ICD-10-CM

## 2024-09-19 DIAGNOSIS — E1122 Type 2 diabetes mellitus with diabetic chronic kidney disease: Secondary | ICD-10-CM | POA: Insufficient documentation

## 2024-09-19 DIAGNOSIS — D75839 Thrombocytosis, unspecified: Secondary | ICD-10-CM | POA: Diagnosis not present

## 2024-09-19 DIAGNOSIS — Z803 Family history of malignant neoplasm of breast: Secondary | ICD-10-CM | POA: Diagnosis not present

## 2024-09-19 DIAGNOSIS — R5383 Other fatigue: Secondary | ICD-10-CM | POA: Insufficient documentation

## 2024-09-19 DIAGNOSIS — Z79899 Other long term (current) drug therapy: Secondary | ICD-10-CM | POA: Insufficient documentation

## 2024-09-19 NOTE — Patient Instructions (Signed)
 [facility] at [facility] **VISIT SUMMARY & IMPORTANT INSTRUCTIONS **   You were seen today by Pleasant Barefoot PA-C for your anemia.    Anemia is a general term used to describe low red blood cells (or low hemoglobin).  You have several factors that could be causing your anemia. LOW IRON :  Low iron  can be caused by blood loss from your stomach or intestines. Low iron  can also be caused by malabsorption - this is when your body has a hard time breaking down and digesting iron  from your food. Your iron  levels look much better after your IV iron !  You do not need any extra iron  at this time. CHRONIC KIDNEY DISEASE When your kidneys do not work as well as they used to, this makes it harder for your body to make new blood cells. This may be part of why you still have anemia even though your iron  levels are normal. POSSIBLE INHERITED ANEMIA Some patients have inherited anemia due to a mutation in their DNA.  This includes types of anemia such as sickle cell and thalassemia. Since you have had anemia for many years even when your iron  is normal, we will check test today to see if you have this inherited anemia.  FOLLOW-UP APPOINTMENT: 4 months  ** Thank you for trusting me with your healthcare!  I strive to provide all of my patients with quality care at each visit.  If you receive a survey for this visit, I would be so grateful to you for taking the time to provide feedback.  Thank you in advance!  ~ Emma Schupp                                        Dr. Mickiel Davonna Pleasant Barefoot, PA-C          Delon Hope, NP   - - - - - - - - - - - - - - - - - -    Thank you for choosing [facility] at [facility] to provide your oncology and hematology care.  To afford each patient quality time with our provider, please arrive at least 15 minutes before your scheduled appointment time.   If you have a lab appointment with the Cancer Center  please come in thru the Main Entrance and check in at the main information desk.  You need to re-schedule your appointment should you arrive 10 or more minutes late.  We strive to give you quality time with our providers, and arriving late affects you and other patients whose appointments are after yours.  Also, if you no show three or more times for appointments you may be dismissed from the clinic at the providers discretion.     Again, thank you for choosing [facility].  Our hope is that these requests will decrease the amount of time that you wait before being seen by our physicians.       _____________________________________________________________  Should you have questions after your visit to [facility], please contact our office at [phone] and follow the prompts.  Our office hours are 8:00 a.m. and 4:30 p.m. Monday - Friday.  Please note that voicemails left after 4:00 p.m. may not be returned until the following business day.  We are closed weekends and major holidays.  You do have access to a nurse 24-7, just call the main number to the clinic [phone] and do not press any options, hold on the line and a nurse will answer the phone.    For prescription refill requests, have your pharmacy contact our office and allow 72 hours.

## 2024-09-21 LAB — HGB FRACTIONATION CASCADE
Hgb A2: 2.6 % (ref 1.8–3.2)
Hgb A: 97.4 % (ref 96.4–98.8)
Hgb F: 0 % (ref 0.0–2.0)
Hgb S: 0 %

## 2024-10-02 ENCOUNTER — Ambulatory Visit: Payer: Self-pay | Admitting: Physician Assistant

## 2024-10-02 LAB — ALPHA-THALASSEMIA ANALYSIS
IMAGE: 0
Indication: NORMAL

## 2024-10-02 NOTE — Progress Notes (Signed)
 NURSES: Please call Andrea Shannon to let her know that her labs showed alpha thalassemia trait.  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  It is not dangerous to her and does not require any specific treatment.  It is simply an explanation of why she has always been anemic.  She does not need any changes to her plan, and I will discuss with her in more detail at her follow-up visit in 4 months.  Pleasant Andrea Barefoot, PA-C 10/02/2024 4:34 PM

## 2024-10-03 NOTE — Progress Notes (Signed)
 Patient made aware and verbalized understanding.

## 2024-10-25 ENCOUNTER — Other Ambulatory Visit (HOSPITAL_COMMUNITY)
Admission: RE | Admit: 2024-10-25 | Discharge: 2024-10-25 | Disposition: A | Discharge status: Home / Self Care | Source: Ambulatory Visit | Attending: Family Medicine | Admitting: Family Medicine

## 2024-10-25 ENCOUNTER — Encounter: Payer: Self-pay | Admitting: Family Medicine

## 2024-10-25 ENCOUNTER — Ambulatory Visit: Payer: Self-pay | Admitting: Family Medicine

## 2024-10-25 VITALS — BP 128/73 | HR 79 | Temp 97.8°F | Ht 63.0 in | Wt 147.0 lb

## 2024-10-25 DIAGNOSIS — R202 Paresthesia of skin: Secondary | ICD-10-CM

## 2024-10-25 DIAGNOSIS — L989 Disorder of the skin and subcutaneous tissue, unspecified: Secondary | ICD-10-CM | POA: Diagnosis present

## 2024-10-25 DIAGNOSIS — F419 Anxiety disorder, unspecified: Secondary | ICD-10-CM

## 2024-10-25 DIAGNOSIS — E1122 Type 2 diabetes mellitus with diabetic chronic kidney disease: Secondary | ICD-10-CM | POA: Diagnosis not present

## 2024-10-25 DIAGNOSIS — D500 Iron deficiency anemia secondary to blood loss (chronic): Secondary | ICD-10-CM

## 2024-10-25 DIAGNOSIS — I152 Hypertension secondary to endocrine disorders: Secondary | ICD-10-CM

## 2024-10-25 DIAGNOSIS — E1159 Type 2 diabetes mellitus with other circulatory complications: Secondary | ICD-10-CM | POA: Diagnosis not present

## 2024-10-25 DIAGNOSIS — Z7984 Long term (current) use of oral hypoglycemic drugs: Secondary | ICD-10-CM

## 2024-10-25 DIAGNOSIS — E118 Type 2 diabetes mellitus with unspecified complications: Secondary | ICD-10-CM

## 2024-10-25 DIAGNOSIS — N183 Chronic kidney disease, stage 3 unspecified: Secondary | ICD-10-CM | POA: Diagnosis not present

## 2024-10-25 DIAGNOSIS — R1011 Right upper quadrant pain: Secondary | ICD-10-CM

## 2024-10-25 DIAGNOSIS — K219 Gastro-esophageal reflux disease without esophagitis: Secondary | ICD-10-CM | POA: Diagnosis not present

## 2024-10-25 LAB — BAYER DCA HB A1C WAIVED: HB A1C (BAYER DCA - WAIVED): 5.5 % (ref 4.8–5.6)

## 2024-10-25 MED ORDER — HYDROXYZINE HCL 10 MG PO TABS: 10.0000 mg | ORAL_TABLET | Freq: Three times a day (TID) | ORAL | 3 refills | Status: AC | PRN

## 2024-10-25 MED ORDER — LISINOPRIL-HYDROCHLOROTHIAZIDE 20-12.5 MG PO TABS: 1.0000 | ORAL_TABLET | Freq: Every day | ORAL | 1 refills | Status: AC

## 2024-10-25 MED ORDER — PANTOPRAZOLE SODIUM 40 MG PO TBEC: 40.0000 mg | DELAYED_RELEASE_TABLET | Freq: Every day | ORAL | 1 refills | Status: AC

## 2024-10-25 NOTE — Progress Notes (Signed)
 "    Subjective:  Patient ID: Andrea Shannon, female    DOB: 1946-10-02, 79 y.o.   MRN: [id number]  Patient Care Team: Severa Rock HERO, FNP as PCP - General (Family Medicine) Boone Ronal Comer SHAUNNA, OD (Optometry) Therisa Benton PARAS, NP as Nurse Practitioner (Nurse Practitioner) Roddie Bring, DPM as Consulting Physician (Podiatry) Dina Camie BRAVO, PA-C (Neurology) Allen Poe, OD (Optometry)   Chief Complaint:  Diabetes (3 month follow up )   HPI: Andrea Shannon is a 79 y.o. female presenting on 10/25/2024 for Diabetes (3 month follow up )    Andrea Shannon is a 79 year old female with diabetes who presents for a follow-up visit and to discuss a mole removal.  She has a mole on her head that is bothersome as it catches on things, though it is not painful. It has not grown, but she would feel better if it were removed.  She is experiencing stress and anxiety and requests a mild anxiolytic. She is prescribed hydroxyzine  10 mg, which she can take up to three times a day as needed. She is concerned about potential drowsiness from the medication.  She mentions having a lot of medication at home and does not need refills for lisinopril , hydrochlorothiazide , or Protonix  at this time. She takes Protonix  for reflux and has been experiencing increased discomfort in the right upper quadrant for about a month. She currently takes two doses of her reflux medication daily and inquires about increasing the dose to three if needed. No nausea or vomiting, and the pain is not related to meals.  Her feet remain cold, and she has been using a cream for nerve pain, which helps with the pain but not the cold sensation. She previously discussed trying gabapentin  but is currently using the cream.  She had an iron  infusion last year and was told in December that her iron  levels were good and she does not need further infusions at this time. She also had cataract surgeries last year. Her feet remain cold, and  she has a history of thyroid  problems and diabetes.          Relevant past medical, surgical, family, and social history reviewed and updated as indicated.  Allergies and medications reviewed and updated. Data reviewed: Chart in Epic.   Past Medical History:  Diagnosis Date   Acid reflux    Arthritis    Diabetes mellitus, type II (HCC)    Hypertension    Hyperthyroidism    Hypothyroidism     Past Surgical History:  Procedure Laterality Date   ABDOMINAL HYSTERECTOMY     CATARACT EXTRACTION Left     Social History   Socioeconomic History   Marital status: Widowed    Spouse name: Not on file   Number of children: 1   Years of education: 12   Highest education level: High school graduate  Occupational History   Not on file  Tobacco Use   Smoking status: Never   Smokeless tobacco: Never  Vaping Use   Vaping status: Never Used  Substance and Sexual Activity   Alcohol  use: Not Currently   Drug use: Never   Sexual activity: Not Currently    Birth control/protection: Surgical  Other Topics Concern   Not on file  Social History Narrative   Right handed   Drinks caffeine coffee   Lives alone   Retired   One son   Social Drivers of Health   Tobacco Use: Low Risk (10/25/2024)   Patient  History    Smoking Tobacco Use: Never    Smokeless Tobacco Use: Never    Passive Exposure: Not on file  Financial Resource Strain: Low Risk (01/11/2024)   Overall Financial Resource Strain (CARDIA)    Difficulty of Paying Living Expenses: Not hard at all  Food Insecurity: No Food Insecurity (04/25/2024)   Epic    Worried About Programme Researcher, Broadcasting/film/video in the Last Year: Never true    Ran Out of Food in the Last Year: Never true  Transportation Needs: No Transportation Needs (04/25/2024)   Epic    Lack of Transportation (Medical): No    Lack of Transportation (Non-Medical): No  Physical Activity: Insufficiently Active (01/11/2024)   Exercise Vital Sign    Days of Exercise per Week: 3 days     Minutes of Exercise per Session: 30 min  Stress: No Stress Concern Present (01/11/2024)   [employer] of Occupational Health - Occupational Stress Questionnaire    Feeling of Stress : Not at all  Social Connections: Moderately Integrated (01/11/2024)   Social Connection and Isolation Panel    Frequency of Communication with Friends and Family: More than three times a week    Frequency of Social Gatherings with Friends and Family: Three times a week    Attends Religious Services: More than 4 times per year    Active Member of Clubs or Organizations: Yes    Attends Banker Meetings: More than 4 times per year    Marital Status: Widowed  Intimate Partner Violence: Not At Risk (04/25/2024)   Epic    Fear of Current or Ex-Partner: No    Emotionally Abused: No    Physically Abused: No    Sexually Abused: No  Depression (PHQ2-9): Low Risk (10/25/2024)   Depression (PHQ2-9)    PHQ-2 Score: 0  Alcohol  Screen: Low Risk (01/11/2024)   Alcohol  Screen    Last Alcohol  Screening Score (AUDIT): 0  Housing: Low Risk (04/25/2024)   Epic    Unable to Pay for Housing in the Last Year: No    Number of Times Moved in the Last Year: 0    Homeless in the Last Year: No  Utilities: Not At Risk (04/25/2024)   Epic    Threatened with loss of utilities: No  Health Literacy: Adequate Health Literacy (01/11/2024)   B1300 Health Literacy    Frequency of need for help with medical instructions: Never    Outpatient Encounter Medications as of 10/25/2024  Medication Sig   aspirin 81 MG chewable tablet Chew by mouth daily.   diclofenac  (VOLTAREN ) 75 MG EC tablet Take 1 tablet (75 mg total) by mouth 2 (two) times daily.   hydrOXYzine  (ATARAX ) 10 MG tablet Take 1 tablet (10 mg total) by mouth 3 (three) times daily as needed for anxiety.   levothyroxine  (SYNTHROID ) 50 MCG tablet Take 1 tablet (50 mcg total) by mouth daily.   metFORMIN  (GLUCOPHAGE ) 500 MG tablet Take 1 tablet (500 mg total) by mouth 2  (two) times daily with a meal.   sucralfate  (CARAFATE ) 1 g tablet Take 1 tablet (1 g total) by mouth 2 (two) times daily.   [DISCONTINUED] lisinopril -hydrochlorothiazide  (ZESTORETIC ) 20-12.5 MG tablet Take 1 tablet by mouth daily.   [DISCONTINUED] pantoprazole  (PROTONIX ) 40 MG tablet Take 1 tablet (40 mg total) by mouth daily.   lisinopril -hydrochlorothiazide  (ZESTORETIC ) 20-12.5 MG tablet Take 1 tablet by mouth daily.   pantoprazole  (PROTONIX ) 40 MG tablet Take 1 tablet (40 mg total) by mouth daily.  No facility-administered encounter medications on file as of 10/25/2024.    Allergies[1]  Pertinent ROS per HPI, otherwise unremarkable      Objective:  BP 128/73   Pulse 79   Temp 97.8 F (36.6 C)   Ht 5' 3 (1.6 m)   Wt 147 lb (66.7 kg)   SpO2 100%   BMI 26.04 kg/m    Wt Readings from Last 3 Encounters:  10/25/24 147 lb (66.7 kg)  09/19/24 148 lb (67.1 kg)  09/06/24 146 lb 9.6 oz (66.5 kg)    Physical Exam Vitals and nursing note reviewed.  Constitutional:      General: She is not in acute distress.    Appearance: Normal appearance. She is not ill-appearing, toxic-appearing or diaphoretic.  HENT:     Head: Normocephalic and atraumatic.     Right Ear: Tympanic membrane, ear canal and external ear normal.     Left Ear: Tympanic membrane, ear canal and external ear normal.     Nose: Nose normal.     Mouth/Throat:     Mouth: Mucous membranes are moist.  Eyes:     Pupils: Pupils are equal, round, and reactive to light.  Cardiovascular:     Rate and Rhythm: Normal rate and regular rhythm.     Heart sounds: Normal heart sounds.  Pulmonary:     Effort: Pulmonary effort is normal.     Breath sounds: Normal breath sounds.  Musculoskeletal:     Cervical back: Neck supple.  Skin:    General: Skin is warm and dry.     Capillary Refill: Capillary refill takes less than 2 seconds.      Neurological:     General: No focal deficit present.     Mental Status: She is alert and  oriented to person, place, and time.  Psychiatric:        Mood and Affect: Mood normal.        Behavior: Behavior normal.        Thought Content: Thought content normal.        Judgment: Judgment normal.    Skin excision  Date/Time: 10/25/2024 11:54 AM  Performed by: Severa Rock HERO, FNP Authorized by: Severa Rock HERO, FNP   Number of Lesions: 1 Lesion 1:    Body area: head/neck   Head/neck location: scalp   Initial size (mm): 1.5   Final defect size (mm): 2   Malignancy: malignancy unknown     Destruction method comment: dermalblade and tweezers     Results for orders placed or performed in visit on 09/19/24  Hgb Fractionation Cascade   Collection Time: 09/19/24 11:27 AM  Result Value Ref Range   Hgb F 0.0 0.0 - 2.0 %   Hgb A 97.4 96.4 - 98.8 %   Hgb A2 2.6 1.8 - 3.2 %   Hgb S 0.0 0.0 %   Interpretation, Hgb Fract Comment   Alpha-Thalassemia Analysis   Collection Time: 09/19/24 11:27 AM  Result Value Ref Range   Source CRE BLOOD    Specimen Type Comment    Indication      Chronic microcytic anemia despite normal iron  levels   Indication Comment    Result Comment (A)    Interpretation Comment    Recommendations Comment    Clinical Information Comment    Comments Comment    Methods/Limitations Comment    References Comment    Genes Analyzed Comment    Director Review/Release Comment    IMAGE .  I spent 45 minutes dedicated to the care of this patient on the date of this encounter to include pre-visit review of records including diagnostic studies and labs, face-to-face time with the patient discussing above conditions, post visit ordering of testing, clinical documentation in the electronic health record, making appropriate referrals as documented, and communicating necessary information to the patient's healthcare team.    Pertinent labs & imaging results that were available during my care of the patient were reviewed by me and considered in my medical  decision making.  Assessment & Plan:  Andrea Shannon was seen today for diabetes.  Diagnoses and all orders for this visit:  Hypertension associated with diabetes (HCC) -     Lipid panel -     CMP14+EGFR -     lisinopril -hydrochlorothiazide  (ZESTORETIC ) 20-12.5 MG tablet; Take 1 tablet by mouth daily.  Diabetes mellitus type 2 with complications (HCC) -     Bayer DCA Hb A1c Waived -     lisinopril -hydrochlorothiazide  (ZESTORETIC ) 20-12.5 MG tablet; Take 1 tablet by mouth daily.  CKD stage 3 due to type 2 diabetes mellitus (HCC)  Gastroesophageal reflux disease without esophagitis -     pantoprazole  (PROTONIX ) 40 MG tablet; Take 1 tablet (40 mg total) by mouth daily.  Anxiety -     hydrOXYzine  (ATARAX ) 10 MG tablet; Take 1 tablet (10 mg total) by mouth 3 (three) times daily as needed for anxiety.  Scalp lesion -     Surgical pathology -     Skin excision  RUQ abdominal pain -     Amylase -     Lipase -     US  Abdomen Limited RUQ (LIVER/GB); Future  Paresthesias -     Anemia Profile B  Iron  deficiency anemia due to chronic blood loss -     Anemia Profile B       Type 2 diabetes mellitus with complications Diabetes management is ongoing and includes management of specific complications. - Ordered A1c test  Gastroesophageal reflux disease Reflux symptoms have worsened over the past week, with pain in the right upper quadrant. Current management includes Protonix  and Carafate , with occasional need for increased Carafate  dosage. - Continue Protonix  as prescribed - Increase Carafate  to three times a day as needed for symptom control  Right upper quadrant abdominal pain Pain has been present for about a month, worsening recently. No associated nausea or vomiting. Differential includes gallbladder or pancreatic issues. - Ordered amylase and lipase labs - Ordered abdominal ultrasound  Scalp lesion Mole on the scalp causing irritation due to catching on hair. No changes in size  or shape. - Removed scalp lesion  Anxiety disorder Requests mild anxiolytic for occasional use. Hydroxyzine  prescribed as needed, with caution regarding potential drowsiness. - Prescribed hydroxyzine  10 mg, up to three times a day as needed  Diabetic peripheral neuropathy Experiencing cold feet and nerve pain. Current management includes topical cream for pain relief. Gabapentin  not initiated due to potential for multiple new medications. - Continue topical cream for neuropathy pain - Monitor symptoms and will consider gabapentin  if needed  Iron  deficiency anemia due to chronic blood loss Iron  levels previously low, but recent follow-up indicated adequate levels. No current need for iron  supplementation. - Will check iron  levels to ensure they remain adequate          Continue all other maintenance medications.  Follow up plan: Return in about 3 months (around 01/23/2025) for HTN, DM.   Continue healthy lifestyle choices, including diet (rich in  fruits, vegetables, and lean proteins, and low in salt and simple carbohydrates) and exercise (at least 30 minutes of moderate physical activity daily).  Educational handout given for DM  The above assessment and management plan was discussed with the patient. The patient verbalized understanding of and has agreed to the management plan. Patient is aware to call the clinic if they develop any new symptoms or if symptoms persist or worsen. Patient is aware when to return to the clinic for a follow-up visit. Patient educated on when it is appropriate to go to the emergency department.   Andrea Bruns, FNP-C [facility] [phone]     [1]  Allergies Allergen Reactions   No Known Allergies    "

## 2024-10-25 NOTE — Patient Instructions (Signed)

## 2024-10-26 LAB — ANEMIA PROFILE B
Basophils Absolute: 0 x10E3/uL (ref 0.0–0.2)
Basos: 1 %
EOS (ABSOLUTE): 0.2 x10E3/uL (ref 0.0–0.4)
Eos: 3 %
Ferritin: 417 ng/mL — ABNORMAL HIGH (ref 15–150)
Folate: >20 ng/mL
Hematocrit: 33.3 % — ABNORMAL LOW (ref 34.0–46.6)
Hemoglobin: 10.3 g/dL — ABNORMAL LOW (ref 11.1–15.9)
Immature Grans (Abs): 0 x10E3/uL (ref 0.0–0.1)
Immature Granulocytes: 0 %
Iron Saturation: 25 % (ref 15–55)
Iron: 70 ug/dL (ref 27–139)
Lymphocytes Absolute: 1.2 x10E3/uL (ref 0.7–3.1)
Lymphs: 22 %
MCH: 25.1 pg — ABNORMAL LOW (ref 26.6–33.0)
MCHC: 30.9 g/dL — ABNORMAL LOW (ref 31.5–35.7)
MCV: 81 fL (ref 79–97)
Monocytes Absolute: 0.3 x10E3/uL (ref 0.1–0.9)
Monocytes: 5 %
Neutrophils Absolute: 3.7 x10E3/uL (ref 1.4–7.0)
Neutrophils: 69 %
Platelets: 236 x10E3/uL (ref 150–450)
RBC: 4.11 x10E6/uL (ref 3.77–5.28)
RDW: 16.6 % — ABNORMAL HIGH (ref 11.7–15.4)
Retic Ct Pct: 1.4 % (ref 0.6–2.6)
Total Iron Binding Capacity: 277 ug/dL (ref 250–450)
UIBC: 207 ug/dL (ref 118–369)
Vitamin B-12: 1377 pg/mL — ABNORMAL HIGH (ref 232–1245)
WBC: 5.4 x10E3/uL (ref 3.4–10.8)

## 2024-10-26 LAB — CMP14+EGFR
ALT: 16 IU/L (ref 0–32)
AST: 16 IU/L (ref 0–40)
Albumin: 4.3 g/dL (ref 3.8–4.8)
Alkaline Phosphatase: 64 IU/L (ref 49–135)
BUN/Creatinine Ratio: 19 (ref 12–28)
BUN: 21 mg/dL (ref 8–27)
Bilirubin Total: <0.2 mg/dL (ref 0.0–1.2)
CO2: 24 mmol/L (ref 20–29)
Calcium: 10.1 mg/dL (ref 8.7–10.3)
Chloride: 101 mmol/L (ref 96–106)
Creatinine, Ser: 1.09 mg/dL — ABNORMAL HIGH (ref 0.57–1.00)
Globulin, Total: 2.1 g/dL (ref 1.5–4.5)
Glucose: 94 mg/dL (ref 70–99)
Potassium: 4 mmol/L (ref 3.5–5.2)
Sodium: 141 mmol/L (ref 134–144)
Total Protein: 6.4 g/dL (ref 6.0–8.5)
eGFR: 53 mL/min/1.73 — ABNORMAL LOW

## 2024-10-26 LAB — AMYLASE: Amylase: 106 U/L (ref 31–110)

## 2024-10-26 LAB — LIPID PANEL
Chol/HDL Ratio: 3.4 ratio (ref 0.0–4.4)
Cholesterol, Total: 169 mg/dL (ref 100–199)
HDL: 49 mg/dL
LDL Chol Calc (NIH): 70 mg/dL (ref 0–99)
Triglycerides: 312 mg/dL — ABNORMAL HIGH (ref 0–149)
VLDL Cholesterol Cal: 50 mg/dL — ABNORMAL HIGH (ref 5–40)

## 2024-10-26 LAB — LIPASE: Lipase: 59 U/L (ref 14–85)

## 2024-10-26 LAB — SURGICAL PATHOLOGY

## 2024-11-02 ENCOUNTER — Ambulatory Visit (HOSPITAL_COMMUNITY)
Admission: RE | Admit: 2024-11-02 | Discharge: 2024-11-02 | Disposition: A | Discharge status: Home / Self Care | Source: Ambulatory Visit | Attending: Family Medicine | Admitting: Family Medicine

## 2024-11-02 DIAGNOSIS — R1011 Right upper quadrant pain: Secondary | ICD-10-CM | POA: Diagnosis present

## 2024-11-07 ENCOUNTER — Encounter: Payer: Self-pay | Admitting: *Deleted

## 2025-01-11 ENCOUNTER — Ambulatory Visit: Payer: Self-pay

## 2025-01-16 ENCOUNTER — Ambulatory Visit

## 2025-01-18 ENCOUNTER — Inpatient Hospital Stay

## 2025-01-23 ENCOUNTER — Ambulatory Visit: Admitting: Family Medicine

## 2025-01-23 ENCOUNTER — Inpatient Hospital Stay: Admitting: Physician Assistant

## 2025-01-24 ENCOUNTER — Ambulatory Visit: Admitting: Family Medicine

## 2025-01-30 ENCOUNTER — Ambulatory Visit: Admitting: Family Medicine

## 2025-03-06 ENCOUNTER — Ambulatory Visit: Admitting: Nurse Practitioner

## 2025-05-04 ENCOUNTER — Ambulatory Visit: Admitting: Physician Assistant
